# Patient Record
Sex: Male | Born: 1935 | Race: White | Hispanic: No | Marital: Married | Smoking: Never smoker
Health system: Southern US, Community
[De-identification: ages and names within clinical notes are randomized; demographics above are authoritative.]

## PROBLEM LIST (undated history)

## (undated) DIAGNOSIS — N183 Chronic kidney disease, stage 3 unspecified: Secondary | ICD-10-CM

## (undated) DIAGNOSIS — I509 Heart failure, unspecified: Secondary | ICD-10-CM

## (undated) DIAGNOSIS — Z955 Presence of coronary angioplasty implant and graft: Secondary | ICD-10-CM

## (undated) DIAGNOSIS — F0390 Unspecified dementia without behavioral disturbance: Secondary | ICD-10-CM

## (undated) DIAGNOSIS — I10 Essential (primary) hypertension: Secondary | ICD-10-CM

## (undated) DIAGNOSIS — I2510 Atherosclerotic heart disease of native coronary artery without angina pectoris: Secondary | ICD-10-CM

## (undated) DIAGNOSIS — K219 Gastro-esophageal reflux disease without esophagitis: Secondary | ICD-10-CM

## (undated) DIAGNOSIS — E785 Hyperlipidemia, unspecified: Secondary | ICD-10-CM

## (undated) HISTORY — DX: Hyperlipidemia, unspecified: E78.5

## (undated) HISTORY — DX: Essential (primary) hypertension: I10

## (undated) HISTORY — DX: Presence of coronary angioplasty implant and graft: Z95.5

## (undated) HISTORY — DX: Gastro-esophageal reflux disease without esophagitis: K21.9

## (undated) HISTORY — PX: CORONARY ARTERY BYPASS GRAFT: SHX141

## (undated) HISTORY — DX: Heart failure, unspecified: I50.9

## (undated) HISTORY — DX: Atherosclerotic heart disease of native coronary artery without angina pectoris: I25.10

---

## 1998-01-17 ENCOUNTER — Inpatient Hospital Stay (HOSPITAL_COMMUNITY): Admission: RE | Admit: 1998-01-17 | Discharge: 1998-01-26 | Payer: Self-pay | Admitting: Cardiology

## 1998-01-30 ENCOUNTER — Other Ambulatory Visit: Admission: RE | Admit: 1998-01-30 | Discharge: 1998-01-30 | Payer: Self-pay | Admitting: Surgery

## 1998-01-30 ENCOUNTER — Inpatient Hospital Stay (HOSPITAL_COMMUNITY): Admission: EM | Admit: 1998-01-30 | Discharge: 1998-02-09 | Payer: Self-pay | Admitting: Surgery

## 2005-06-25 ENCOUNTER — Emergency Department (HOSPITAL_COMMUNITY): Admission: EM | Admit: 2005-06-25 | Discharge: 2005-06-25 | Payer: Self-pay | Admitting: Family Medicine

## 2008-10-05 DIAGNOSIS — Z955 Presence of coronary angioplasty implant and graft: Secondary | ICD-10-CM

## 2008-10-05 HISTORY — DX: Presence of coronary angioplasty implant and graft: Z95.5

## 2008-10-10 ENCOUNTER — Inpatient Hospital Stay (HOSPITAL_COMMUNITY): Admission: EM | Admit: 2008-10-10 | Discharge: 2008-10-12 | Payer: Self-pay | Admitting: Cardiology

## 2008-10-11 HISTORY — PX: CARDIAC CATHETERIZATION: SHX172

## 2010-03-05 ENCOUNTER — Ambulatory Visit: Payer: Self-pay | Admitting: Cardiology

## 2010-07-04 ENCOUNTER — Ambulatory Visit: Payer: Self-pay | Admitting: Cardiology

## 2010-10-15 LAB — COMPREHENSIVE METABOLIC PANEL
ALT: 12 U/L (ref 0–53)
AST: 22 U/L (ref 0–37)
Albumin: 3.8 g/dL (ref 3.5–5.2)
CO2: 26 mEq/L (ref 19–32)
Calcium: 8.7 mg/dL (ref 8.4–10.5)
GFR calc Af Amer: >60 mL/min (ref >60)
Sodium: 139 mEq/L (ref 135–145)
Total Protein: 6.5 g/dL (ref 6.0–8.3)

## 2010-10-15 LAB — POCT CARDIAC MARKERS
CKMB, poc: 2.1 ng/mL (ref 1.0–8.0)
CKMB, poc: 2.6 ng/mL (ref 1.0–8.0)
Myoglobin, poc: 130 ng/mL (ref 12–200)
Troponin i, poc: <0.05 ng/mL (ref 0.00–0.09)

## 2010-10-15 LAB — CARDIAC PANEL(CRET KIN+CKTOT+MB+TROPI)
CK, MB: 5.8 ng/mL — ABNORMAL HIGH (ref 0.3–4.0)
Relative Index: INVALID (ref 0.0–2.5)
Troponin I: 0.17 ng/mL — ABNORMAL HIGH (ref 0.00–0.06)

## 2010-10-15 LAB — BASIC METABOLIC PANEL
Calcium: 8.8 mg/dL (ref 8.4–10.5)
Creatinine, Ser: 1.09 mg/dL (ref 0.4–1.5)
GFR calc Af Amer: >60 mL/min (ref >60)
GFR calc non Af Amer: >60 mL/min (ref >60)
Glucose, Bld: 195 mg/dL — ABNORMAL HIGH (ref 70–99)
Sodium: 136 mEq/L (ref 135–145)

## 2010-10-15 LAB — DIFFERENTIAL
Basophils Absolute: 0 10*3/uL (ref 0.0–0.1)
Basophils Relative: 0 % (ref 0–1)
Monocytes Absolute: 0.3 10*3/uL (ref 0.1–1.0)
Neutro Abs: 4.3 10*3/uL (ref 1.7–7.7)
Neutrophils Relative %: 73 % (ref 43–77)

## 2010-10-15 LAB — CBC
Hemoglobin: 10.9 g/dL — ABNORMAL LOW (ref 13.0–17.0)
MCHC: 34.2 g/dL (ref 30.0–36.0)
Platelets: 130 10*3/uL — ABNORMAL LOW (ref 150–400)
RBC: 3.51 MIL/uL — ABNORMAL LOW (ref 4.22–5.81)
RDW: 12.7 % (ref 11.5–15.5)
RDW: 13.1 % (ref 11.5–15.5)

## 2010-10-15 LAB — GLUCOSE, CAPILLARY
Glucose-Capillary: 105 mg/dL — ABNORMAL HIGH (ref 70–99)
Glucose-Capillary: 109 mg/dL — ABNORMAL HIGH (ref 70–99)
Glucose-Capillary: 127 mg/dL — ABNORMAL HIGH (ref 70–99)
Glucose-Capillary: 135 mg/dL — ABNORMAL HIGH (ref 70–99)
Glucose-Capillary: 161 mg/dL — ABNORMAL HIGH (ref 70–99)

## 2010-10-15 LAB — POCT I-STAT, CHEM 8
Calcium, Ion: 1.14 mmol/L (ref 1.12–1.32)
Chloride: 104 mEq/L (ref 96–112)
HCT: 36 % — ABNORMAL LOW (ref 39.0–52.0)
Potassium: 4.5 mEq/L (ref 3.5–5.1)

## 2010-10-15 LAB — APTT: aPTT: 30 seconds (ref 24–37)

## 2010-10-15 LAB — CK TOTAL AND CKMB (NOT AT ARMC): Relative Index: INVALID (ref 0.0–2.5)

## 2010-10-16 ENCOUNTER — Encounter: Payer: Self-pay | Admitting: *Deleted

## 2010-10-17 ENCOUNTER — Ambulatory Visit (INDEPENDENT_AMBULATORY_CARE_PROVIDER_SITE_OTHER): Payer: Medicare Other | Admitting: Cardiology

## 2010-10-17 ENCOUNTER — Encounter: Payer: Self-pay | Admitting: Cardiology

## 2010-10-17 DIAGNOSIS — Z9889 Other specified postprocedural states: Secondary | ICD-10-CM

## 2010-10-17 DIAGNOSIS — K3189 Other diseases of stomach and duodenum: Secondary | ICD-10-CM

## 2010-10-17 DIAGNOSIS — R1013 Epigastric pain: Secondary | ICD-10-CM

## 2010-10-17 DIAGNOSIS — E1159 Type 2 diabetes mellitus with other circulatory complications: Secondary | ICD-10-CM | POA: Insufficient documentation

## 2010-10-17 DIAGNOSIS — E7800 Pure hypercholesterolemia, unspecified: Secondary | ICD-10-CM

## 2010-10-17 DIAGNOSIS — Z951 Presence of aortocoronary bypass graft: Secondary | ICD-10-CM | POA: Insufficient documentation

## 2010-10-17 DIAGNOSIS — I119 Hypertensive heart disease without heart failure: Secondary | ICD-10-CM | POA: Insufficient documentation

## 2010-10-17 DIAGNOSIS — E119 Type 2 diabetes mellitus without complications: Secondary | ICD-10-CM

## 2010-10-17 LAB — COMPREHENSIVE METABOLIC PANEL
Albumin: 4.5 g/dL (ref 3.5–5.2)
BUN: 20 mg/dL (ref 6–23)
Calcium: 9.9 mg/dL (ref 8.4–10.5)
Chloride: 103 mEq/L (ref 96–112)
Glucose, Bld: 90 mg/dL (ref 70–99)
Potassium: 5 mEq/L (ref 3.5–5.1)

## 2010-10-17 LAB — HEMOGLOBIN A1C: Hgb A1c MFr Bld: 7.4 % — ABNORMAL HIGH (ref 4.6–6.5)

## 2010-10-17 LAB — LIPID PANEL
Cholesterol: 144 mg/dL (ref 0–200)
Triglycerides: 127 mg/dL (ref 0.0–149.0)

## 2010-10-17 NOTE — Assessment & Plan Note (Signed)
The patient has a history of essential hypertension.  He's not having any headaches or dizziness.  He's not having any symptoms of congestive heart failure.

## 2010-10-17 NOTE — Assessment & Plan Note (Signed)
The patient has not been expressing any recent chest pain or recurrent angina pectoris.  He has been walking regularly for exercise.  His walking is limited somewhat because of back pain Which is a chronic problem.

## 2010-10-17 NOTE — Progress Notes (Signed)
Jerry Benson Date of Birth:  02-Jul-1929 [facility] / [facility] [address]   [address] [place], [place]  [address] [phone]           Fax   [phone] Jerry Benson Date of Birth:  1928/07/23 [facility] / [facility] [address]   [address] [place], [place]  [address] [phone]           Fax   [phone]  History of Present Illness: This pleasant 77 year old gentleman has known ischemic heart disease.  He has had coronary artery bypass grafting twice.  His most recent heart surgery was in 2009.  In April of 2010 he was admitted with unstable angina and had cardiac catheterization on 10/11/08 and underwent percutaneous intervention on the saphenous vein graft to the diagonal and the obtuse marginal sequentially.  Since that time the patient has not been expressing any problems with angina pectoris.  The patient is not having any symptoms of congestive heart failure.  He is a diabetic but is not having any hypoglycemic episodes.  He tries to walk on a regular basis for exercise.  Current Outpatient Prescriptions  Medication Sig Dispense Refill  . ALPRAZolam (XANAX) 0.25 MG tablet Take 0.25 mg by mouth as needed.        Marland Kitchen aspirin 81 MG tablet Take 81 mg by mouth daily.        . clopidogrel (PLAVIX) 75 MG tablet Take 75 mg by mouth daily.        . famotidine (PEPCID) 40 MG tablet Take 40 mg by mouth as needed.       Marland Kitchen glipiZIDE (GLUCOTROL) 10 MG 24 hr tablet Take 10 mg by mouth daily.        . isosorbide dinitrate (ISORDIL) 20 MG tablet Take 10 mg by mouth daily. ( 1/2 tablet )       . metFORMIN (GLUCOPHAGE) 500 MG tablet Take 500 mg by mouth 3 (three) times daily.        . metoprolol (LOPRESSOR) 50 MG tablet Take 25 mg by mouth 2 (two) times daily. ( 1/2 tablet )       . nitroGLYCERIN (NITROSTAT) 0.4 MG SL tablet Place 0.4 mg under the tongue every 5 (five) minutes as needed.        . rosuvastatin (CRESTOR) 10 MG tablet Take 5 mg by mouth daily. (  1/2 tablet )         Allergies  Allergen Reactions  . Lipitor (Atorvastatin Calcium)   . Lisinopril     Rash / cough    Patient Active Problem List  Diagnoses  . Diabetes mellitus  . Hypercholesterolemia  . Hx of CABG  . Benign hypertensive heart disease without heart failure  . Dyspepsia    History  Smoking status  . Never Smoker   Smokeless tobacco  . Never Used    History  Alcohol Use No    Family History  Problem Relation Age of Onset  . Stroke Daughter     09/2008  . Heart disease Father   . Heart attack Father   . Heart attack Brother     Review of Systems: Constitutional: no fever chills diaphoresis or fatigue or change in weight.  Head and neck: no hearing loss, no epistaxis, no photophobia or visual disturbance. Respiratory: No cough, shortness of breath or wheezing. Cardiovascular: No chest pain peripheral edema, palpitations. Gastrointestinal: No abdominal distention, no abdominal pain, no change in bowel habits  hematochezia or melena. Genitourinary: No dysuria, no frequency, no urgency, no nocturia. Musculoskeletal:No arthralgias, , no gait disturbance or myalgias. Neurological: No dizziness, no headaches, no numbness, no seizures, no syncope, no weakness, no tremors. Hematologic: No lymphadenopathy, no easy bruising. Psychiatric: No confusion, no hallucinations, no sleep disturbance.    Physical Exam: Filed Vitals:   10/17/10 1058  BP: 132/80  Pulse: 80  The general appearance is an elderly gentleman in no acute distress.Pupils equal and reactive.   Extraocular Movements are full.  There is no scleral icterus.  The mouth and pharynx are normal.  The neck is supple.  The carotids reveal no bruits.  The jugular venous pressure is normal.  The thyroid is not enlarged.  There is no lymphadenopathy.The chest is clear to percussion and auscultation. There are no rales or rhonchi. Expansion of the chest is symmetrical.  The precordium is quiet.  The  first heart sound is normal.  The second heart sound is physiologically split.  There is no murmur gallop rub or click.  There is no abnormal lift or heave.The abdomen is soft and nontender. Bowel sounds are normal. The liver and spleen are not enlarged. There Are no abdominal masses. There are no bruits.The pedal pulses are good.  There is no phlebitis or edema.  There is no cyanosis or clubbing.Strength is normal and symmetrical in all extremities.  There is no lateralizing weakness.  There are no sensory deficits.   Assessment / Plan: Blood work is pending today.  Continue same medication.  Recheck in 4 months.

## 2010-10-17 NOTE — Assessment & Plan Note (Signed)
The patient is on low dose Crestor.  He is not having any myalgias from the Crestor.

## 2010-10-17 NOTE — Assessment & Plan Note (Signed)
The patient has not been having any hypoglycemic episodes.  He is trying to stay on a careful diabetic diet.

## 2010-10-23 ENCOUNTER — Telehealth: Payer: Self-pay | Admitting: *Deleted

## 2010-10-23 NOTE — Telephone Encounter (Signed)
Message copied by Regis Bill on Thu Oct 23, 2010 11:25 AM ------      Message from: Cassell Clement      Created: Sun Oct 19, 2010  4:40 PM       Blood tests are excellent.  The blood sugar is normal.  The cholesterol is normal.  The hemoglobin A1c is still slightly high at 7.4 so continue careful diabetic diet

## 2010-10-23 NOTE — Telephone Encounter (Signed)
Advised patient of labs 

## 2010-11-07 ENCOUNTER — Other Ambulatory Visit: Payer: Self-pay | Admitting: Cardiology

## 2010-11-07 NOTE — Telephone Encounter (Signed)
Fax received from pharmacy. Refill completed. Jodette Tifanny Dollens RN  

## 2010-11-13 ENCOUNTER — Other Ambulatory Visit: Payer: Self-pay | Admitting: Cardiology

## 2010-11-13 NOTE — Telephone Encounter (Signed)
Faxed refill for metformin to [facility] #[id number]

## 2010-11-18 NOTE — Discharge Summary (Signed)
Jerry Benson, Jerry Benson NO.:  [id number]   MEDICAL RECORD NO.:  [id number]          PATIENT TYPE:  INP   LOCATION:  2502                         FACILITY:  [facility]   PHYSICIAN:  Colleen Can. Deborah Chalk, M.D.DATE OF BIRTH:  12-Apr-1929   DATE OF ADMISSION:  10/10/2008  DATE OF DISCHARGE:  10/12/2008                               DISCHARGE SUMMARY   DISCHARGE DIAGNOSES:  1. Chest pain with slight elevation in CK and troponin levels with      subsequent elective cardiac catheterization followed by      percutaneous coronary intervention and stenting of the saphenous      vein graft to the diagonal/obtuse marginal.  There is a 3.5 x 15 mm      Xience (drug-eluting) stent placed.  2. Known ischemic heart disease with history of coronary artery bypass      grafting x2 per Dr. Evelene Croon in July 1999.  His original bypass      grafting was x5 in 1981 by Dr. Edilia Bo.  3. Non-insulin-dependent diabetes.  4. Hypertension  5. Hyperlipidemia.  6. Indigestion.   HISTORY OF PRESENT ILLNESS:  The patient is a very pleasant 77 year old  white male who presented with chest pain.  He has been followed by Dr.  Patty Sermons on a regular basis.  He had had chest pain for the past week  or so prior to admission.  They occurred with any sort of exertion.  It  was described as a heaviness and located in the mid of the chest.  It  radiated into his throat out to his shoulders and down his arms.  It was  relieved with rest and/or nitroglycerin.  He had called the office on  Monday night but refused to come to the hospital.  He was called in  additional nitroglycerin which seemed to help.  He subsequently was  convinced to come into the emergency room and subsequently was admitted  on October 10, 2008.   Please see the dictated history and physical for further patient  presentation and profile.   LABORATORY DATA:  On admission CBC showed a hemoglobin 12.5, hematocrit  36.5, white count 5.9,  platelets 130.  Chemistries showed sodium 139,  potassium 3.9, chloride 105, CO2 26, BUN 12, creatinine 0.9, and glucose  of 116.  PT and PTT were unremarkable.  His troponins peaked at 0.23.  His CK MBs peaked at 5.8.  TSH was normal at 2.6.  BNP was 275.   Chest x-ray on admission showed no acute findings.   His EKG showed sinus rhythm with left ventricular hypertrophy associated  with ST and T-wave changes.  There were no acute changes.   HOSPITAL COURSE:  The patient was admitted electively.  He was placed on  IV nitroglycerin and IV heparin.  We proceeded on with cardiac  catheterization the following morning.  That procedure was tolerated  well without any known complications.  There was a severe stenosis in  the saphenous vein graft to the diagonal and obtuse marginal with  successful stent placement proximally.  He had a  3.5 x 15 mm Xience drug-  eluting stent placed.  The vein graft to the right coronary was patent.  The left internal mammary to the diagonal and LAD was patent.  The  native circulation was totally occluded and there was mild reduction of  LV ejection fraction with lateral hypokinesia.  The ejection fraction  was estimated to be 45%.   Postprocedure, he was transferred to 2500.  He has done well throughout  the remainder of his hospitalization.  He will need to be on Plavix for  1 year.  Today on October 12, 2008 he is doing well without complaints and  is felt to be a satisfactory candidate for discharge. Blood pressure was  elevated at time of discharge and the patient's ace inhibitor was  increased.   DISCHARGE LABORATORY DATA:  His hemoglobin is 10.9, hematocrit is 31.3,  platelets are 112.  Chemistries are all normal except for glucose of  195.   DISCHARGE DIAGNOSIS:  Stable.   DISCHARGE DIET:  Diabetic, low-salt heart-healthy.   INSTRUCTIONS:  He is to increase his activity slowly.  He is not to  drive or engage in sexual activity until seen back in  followup.   He is to use an ice pack if needed to the groin.   We will have him see Dr. Patty Sermons in approximately 1 week.  We will  also do repeat lab work on Monday to follow up his renal function as  well as his CBC on Monday, October 15, 2008.   DISCHARGE MEDICINES:  1. Plavix 75 mg a day every day for 1 year.  2. Pepcid 40 mg at night.  3. He will stay off metformin until his blood work has been done on      Monday.  4. Lotensin is increased to 20 mg a day.  5. Glipizide 10 mg a day.  6. Lopressor 25 b.i.d.  7. Isosorbide 20 mg half a tablet three times a day. He reportedly      took this only prn, but will now take as scheduled.  8. Crestor 10 mg half a tablet daily.  9. Xanax p.r.n.  10.Nitroglycerin p.r.n.  11.Enteric-coated aspirin 325 mg a day.   He is to call the office if any problems would arise in the interim.   Greater than 30 minutes for discharge.      Sharlee Blew, N.P.      Colleen Can. Deborah Chalk, M.D.  Electronically Signed    LC/MEDQ  D:  10/12/2008  T:  10/12/2008  Job:  [id number]

## 2010-11-18 NOTE — Cardiovascular Report (Signed)
NAMEBRANTLEE, PENN NO.:  [id number]   MEDICAL RECORD NO.:  [id number]          PATIENT TYPE:  INP   LOCATION:  2036                         FACILITY:  [facility]   PHYSICIAN:  Colleen Can. Deborah Chalk, M.D.DATE OF BIRTH:  Apr 02, 1929   DATE OF PROCEDURE:  10/11/2008  DATE OF DISCHARGE:                            CARDIAC CATHETERIZATION   PROCEDURES:  Left heart catheterization with selective coronary  angiography, left ventricular angiography, saphenous vein graft  angiography x2, and angiography of left internal mammary artery, with  percutaneous coronary intervention on the saphenous vein graft to the  diagonal and obtuse marginal sequentially.   TYPE AND SITE OF ENTRY:  Percutaneous right femoral artery with Angio-  Seal.   CATHETERS:  6-French 4 curved Judkins right and left coronary catheters,  6-French pigtail ventriculographic catheter, 5-French left coronary  artery bypass graft catheter, 6-French left coronary artery bypass graft  guide, Prowater guidewire, BMW guidewire, 2.5 x 15 mm apex, and  subsequently a 3.5 x 15 mm XIENCE (drug-eluting) stent.   MEDICATIONS GIVEN PRIOR TO PROCEDURE:  Valium 10 mg p.o.   MEDICATIONS GIVEN DURING THE PROCEDURE:  Plavix, Pepcid, and Angiomax.   COMMENTS:  The patient tolerated the procedure well.   HEMODYNAMIC DATA:  The aortic pressure was 149/73, LV was 125/4-10.  There is no aortic valve gradient noted on pullback.   ANGIOGRAPHIC DATA:  Left ventricular angiogram was performed in RAO  position.  Overall cardiac size and silhouette were normal.  There was  lateral hypokinesis.  The ejection fraction was 45%.   CORONARY ARTERIES:  1. Right coronary artery is totally occluded proximally.  There was a      conus branch, they gave slight flow to the RV wall.  2. Left main coronary artery is occluded with only scanty flow to      proximal septal perforating branch.  All other vessels were      occluded.   Saphenous  vein graft to the posterior descending artery is widely patent  with a nice insertion.  Good distal runoff.  There mild distal coronary  atherosclerosis, but no significant obstructive disease.   Saphenous vein graft sequentially to the diagonal and obtuse marginal  had a severe 90% stenosis proximally.  The remainder of the vein graft  was relatively smooth.  Insertion sites were satisfactory with only mild  narrowing in the distal insertion site to the obtuse marginal.  There  was somewhat mild-to-moderate diffuse disease in the diagonal and obtuse  marginal systems otherwise.   Left internal mammary artery to the diagonal and LAD sequentially was  patent with excellent flow and satisfactory distal runoff into both  branches.   ANGIOPLASTY PROCEDURE:  Angioplasty was performed using a 6-French left  coronary artery bypass graft catheter.  We initially passed Prowater  guidewire across the lesion without difficulty.  We were able to  initially pass the 2.5 x 15 mm apex balloon across the lesion and  dilated.  However, when we returned to pass the stent, we were unable to  pass the stent across the lesion.  We returned with the apex balloon and  tried to pass it across this to predilate second time, we had difficulty  getting it to pass.  We then passed a B and W (buddy and wire) across  and we were able to pass the balloon back across the lesion.  We dilated  it to 18 atmospheres.  We then returned with the 3.5 x 15 mm XIENCE  stent.  It was inflated to maximum of 18 atmospheres with full  expansion.  It was an excellent fit for the vein graft with no residual  stenosis.   OVERALL IMPRESSION:  1. Severe stenosis in the saphenous vein graft to the diagonal and      obtuse marginal with successful stent placement proximally.  2. Patent saphenous vein graft to the right coronary artery.  3. Patent sequential left internal mammary artery to the diagonal and      LAD.  4. Totally  occluded native circulation.  5. Mild reduction left ventricular ejection fraction with lateral      hypokinesis.   DISCUSSION:  Mr. Lamere will need to remain on Plavix for 1 year.  He is  felt to be completely revascularized at this point in time.      Colleen Can. Deborah Chalk, M.D.  Electronically Signed     SNT/MEDQ  D:  10/11/2008  T:  10/11/2008  Job:  [id number]

## 2010-11-18 NOTE — H&P (Signed)
NAMEJURGEN, GROENEVELD NO.:  [id number]   MEDICAL RECORD NO.:  [id number]          PATIENT TYPE:  INP   LOCATION:  2036                         FACILITY:  [facility]   PHYSICIAN:  Vesta Mixer, M.D. DATE OF BIRTH:  [birth year]   DATE OF ADMISSION:  10/10/2008  DATE OF DISCHARGE:                              HISTORY & PHYSICAL   Jerry Benson is a 77 year old gentleman with a history of coronary artery  disease.  He is status post coronary artery bypass grafting x2.  His  last bypass grafting was in 2009.  He has been seen by Dr. Patty Sermons on  a regular basis.  He has been having chest pain for the past week or so.  These episodes of chest pain occur with any sort of exertion.  They are  described as a heaviness.  It is in the middle of the chest.  It  radiates up to his throat out to his shoulders and down to his arms.  It  is relieved with rest and/or nitroglycerin.  Recently, for the past day  or so, he has had progressive shortness of breath and chest pain and has  had chest pain whenever he has not taken nitroglycerin.  He has taken  many nitroglycerins over the past couple of days.  His wife called me on  Monday night.  He refused to come to the hospital, but we did call an  additional nitroglycerin which seems to help.  She finally convinced him  to come in today.   He has had some indigestion-like chest pain.  There is some nausea.  There is some diaphoresis.  He denies any PND or orthopnea.  He denies  any syncope or presyncope.   CURRENT MEDICATIONS:  1. Lopressor 25 mg p.o. b.i.d.  2. Metformin 1000 mg p.o. b.i.d.  3. Isosorbide 20 mg - 1/2 tablet 3 times a day.  4. Crestor 5 mg a day.  5. Glipizide 10 mg a day.  6. Lotensin 10 mg a day.  7. Xanax as needed.   ALLERGIES:  None.   PAST MEDICAL HISTORY:  1. Coronary artery disease.  He is status post coronary artery bypass      grafting in 1999 by Dr. Rexanne Mano.  2. Hyperlipidemia.  3. History of  hypertension.  4. Diabetes mellitus.   SOCIAL HISTORY:  The patient is a nonsmoker and nondrinker.   FAMILY HISTORY:  Positive for coronary artery disease.   REVIEW OF SYSTEMS:  Reviewed in the HPI.  All other systems were  reviewed and are negative.   PHYSICAL EXAMINATION:  GENERAL:  He is an elderly gentleman in no acute  distress.  VITAL SIGNS:  His blood pressure is 168/107 with a heart rate of 78.  HEENT:  2+ carotids.  He has no bruits, no JVD, no thyromegaly.  Sclerae  are nonicteric.  His mucous membranes are moist.  NECK:  Supple.  LUNGS:  Clear.  HEART:  Regular rate, S1 and S2.  ABDOMEN:  Good bowel sounds and is nontender.  EXTREMITIES:  There is no  clubbing, cyanosis, or edema.  NEURO:  Nonfocal.  SKIN:  Warm and dry.  His gait was not able to be assessed.   His EKG reveals normal sinus rhythm.  He has left ventricular  hypertrophy with associated ST and T wave changes.  There are no acute  changes.   LABORATORY DATA:  White blood cell count of 5.9, hemoglobin is 12.5, and  hematocrit is 36.5.  Sodium is 140, potassium is 4.5, chloride is 104,  glucose is 145, and creatinine is 1.2.  His BNP is 275.  His CPK-MB is  2.1 with a troponin of 0.05.   IMPRESSION AND PLAN:  1. Chest pain.  The patient presents with fairly typical symptoms for      unstable angina.  His pain has been relieved with sublingual      nitroglycerin.  We will go ahead and start him on intravenous      nitroglycerin.  We will also put him on heparin.  We will      anticipate doing a heart catheterization on him tomorrow.  He has      had 2 heart catheterizations.  We have discussed the risks,      benefits, and options of heart catheterization.  He understands and      agrees to proceed.  2. Diabetes mellitus.  We will check Accu-Cheks and put him on sliding      scale insulin as needed.  3. All other medical problems are stable.      Vesta Mixer, M.D.  Electronically  Signed     PJN/MEDQ  D:  10/10/2008  T:  10/11/2008  Job:  [id number]   cc:   Cassell Clement, M.D.

## 2010-11-25 ENCOUNTER — Other Ambulatory Visit: Payer: Self-pay | Admitting: Cardiology

## 2010-11-25 DIAGNOSIS — E785 Hyperlipidemia, unspecified: Secondary | ICD-10-CM

## 2010-11-25 NOTE — Telephone Encounter (Signed)
escribe request  

## 2010-12-06 ENCOUNTER — Other Ambulatory Visit: Payer: Self-pay | Admitting: Cardiology

## 2010-12-06 DIAGNOSIS — I10 Essential (primary) hypertension: Secondary | ICD-10-CM

## 2010-12-10 NOTE — Telephone Encounter (Signed)
escribe request  

## 2011-01-05 ENCOUNTER — Other Ambulatory Visit: Payer: Self-pay | Admitting: *Deleted

## 2011-01-05 DIAGNOSIS — K30 Functional dyspepsia: Secondary | ICD-10-CM

## 2011-01-05 MED ORDER — FAMOTIDINE 40 MG PO TABS: 40.0000 mg | ORAL_TABLET | ORAL | Status: DC | PRN

## 2011-01-05 NOTE — Telephone Encounter (Signed)
Refilled famotidine 

## 2011-01-20 ENCOUNTER — Other Ambulatory Visit: Payer: Self-pay | Admitting: Cardiology

## 2011-01-21 ENCOUNTER — Other Ambulatory Visit (INDEPENDENT_AMBULATORY_CARE_PROVIDER_SITE_OTHER): Payer: Medicare Other | Admitting: *Deleted

## 2011-01-21 ENCOUNTER — Encounter: Payer: Self-pay | Admitting: Nurse Practitioner

## 2011-01-21 ENCOUNTER — Ambulatory Visit (INDEPENDENT_AMBULATORY_CARE_PROVIDER_SITE_OTHER): Payer: Medicare Other | Admitting: Nurse Practitioner

## 2011-01-21 DIAGNOSIS — R0989 Other specified symptoms and signs involving the circulatory and respiratory systems: Secondary | ICD-10-CM

## 2011-01-21 DIAGNOSIS — R079 Chest pain, unspecified: Secondary | ICD-10-CM

## 2011-01-21 DIAGNOSIS — E7800 Pure hypercholesterolemia, unspecified: Secondary | ICD-10-CM

## 2011-01-21 DIAGNOSIS — I119 Hypertensive heart disease without heart failure: Secondary | ICD-10-CM

## 2011-01-21 DIAGNOSIS — I2510 Atherosclerotic heart disease of native coronary artery without angina pectoris: Secondary | ICD-10-CM

## 2011-01-21 LAB — CBC WITH DIFFERENTIAL/PLATELET
Basophils Absolute: 0 10*3/uL (ref 0.0–0.1)
Basophils Relative: 0.2 % (ref 0.0–3.0)
Eosinophils Absolute: 0.2 10*3/uL (ref 0.0–0.7)
Eosinophils Relative: 3.5 % (ref 0.0–5.0)
HCT: 38.8 % — ABNORMAL LOW (ref 39.0–52.0)
Hemoglobin: 13 g/dL (ref 13.0–17.0)
Lymphocytes Relative: 16.6 % (ref 12.0–46.0)
Lymphs Abs: 1 10*3/uL (ref 0.7–4.0)
MCHC: 33.5 g/dL (ref 30.0–36.0)
MCV: 92.7 fl (ref 78.0–100.0)
Monocytes Absolute: 0.4 10*3/uL (ref 0.1–1.0)
Monocytes Relative: 5.9 % (ref 3.0–12.0)
Neutro Abs: 4.5 10*3/uL (ref 1.4–7.7)
Neutrophils Relative %: 73.8 % (ref 43.0–77.0)
Platelets: 130 10*3/uL — ABNORMAL LOW (ref 150.0–400.0)
RBC: 4.18 Mil/uL — ABNORMAL LOW (ref 4.22–5.81)
RDW: 13.4 % (ref 11.5–14.6)
WBC: 6 10*3/uL (ref 4.5–10.5)

## 2011-01-21 LAB — LIPID PANEL
Total CHOL/HDL Ratio: 3
Triglycerides: 165 mg/dL — ABNORMAL HIGH (ref 0.0–149.0)

## 2011-01-21 LAB — HEPATIC FUNCTION PANEL
AST: 23 U/L (ref 0–37)
Albumin: 4.6 g/dL (ref 3.5–5.2)
Alkaline Phosphatase: 58 U/L (ref 39–117)
Bilirubin, Direct: 0.1 mg/dL (ref 0.0–0.3)
Total Bilirubin: 0.9 mg/dL (ref 0.3–1.2)

## 2011-01-21 LAB — BASIC METABOLIC PANEL
CO2: 28 mEq/L (ref 19–32)
Calcium: 9.6 mg/dL (ref 8.4–10.5)
Creatinine, Ser: 1.1 mg/dL (ref 0.4–1.5)

## 2011-01-21 MED ORDER — ISOSORBIDE DINITRATE 20 MG PO TABS: 10.0000 mg | ORAL_TABLET | Freq: Every day | ORAL | Status: DC

## 2011-01-21 MED ORDER — AMLODIPINE BESYLATE 5 MG PO TABS: 5.0000 mg | ORAL_TABLET | Freq: Every day | ORAL | Status: DC

## 2011-01-21 MED ORDER — NITROGLYCERIN 0.4 MG SL SUBL: 0.4000 mg | SUBLINGUAL_TABLET | SUBLINGUAL | Status: DC | PRN

## 2011-01-21 NOTE — Patient Instructions (Signed)
We are going to add Norvasc (amlodipine) 5 mg to your medicines. Take each day. Take your Isordil every day I have refilled your NTG. Use your NTG under your tongue for recurrent chest pain. May take one tablet every 5 minutes. If you are still having discomfort after 3 tablets in 15 minutes, call 911. We are going to check some labs today. I will see you back early next week. Go to the ER for any problems in the interim.Marland Kitchen

## 2011-01-21 NOTE — Assessment & Plan Note (Signed)
Labs are checked today in follow up.

## 2011-01-21 NOTE — Progress Notes (Signed)
Jerry Benson Date of Birth: 01/13/1929   History of Present Illness: Jerry Benson is seen today for a work in visit. He is seen for Dr. Patty Sermons. He was last here in April and due back in August. He is here with his daughter. She notes that he seems to be complaining of more chest pain. He describes a sharp shooting pain across his left chest. His NTG is old. He only takes his Isordil prn. Blood pressure is grossly elevated. No shortness of breath. No real exertional component. He says it feels like his heart pain, but review of his last admission reported that he usually has more of a pressure sensation with radiation to neck and arms. He is not having any of these current symptoms. He does have GERD and is on chronic Pepcid.   Current Outpatient Prescriptions on File Prior to Visit  Medication Sig Dispense Refill  . ALPRAZolam (XANAX) 0.25 MG tablet Take 0.25 mg by mouth as needed.        Marland Kitchen aspirin 81 MG tablet Take 81 mg by mouth daily.        . benazepril (LOTENSIN) 10 MG tablet TAKE 1 TABLET BY MOUTH EVERY DAY  90 tablet  3  . CRESTOR 10 MG tablet TAKE  1/2 TABLET BY MOUTH DAILY  45 tablet  0  . famotidine (PEPCID) 40 MG tablet Take 1 tablet (40 mg total) by mouth as needed.  90 tablet  3  . glipiZIDE (GLUCOTROL) 10 MG 24 hr tablet Take 10 mg by mouth daily.        . metFORMIN (GLUCOPHAGE) 500 MG tablet TAKE 1 TABLET BY MOUTH THREE TIMES DAILY  270 tablet  3  . metoprolol (LOPRESSOR) 50 MG tablet TAKE 1/2 TABLET BY MOUTH TWICE DAILY  30 tablet  6  . PLAVIX 75 MG tablet TAKE 1 TABLET BY MOUTH DAILY  90 tablet  0  . DISCONTD: isosorbide dinitrate (ISORDIL) 20 MG tablet Take 10 mg by mouth daily. ( 1/2 tablet )       . DISCONTD: nitroGLYCERIN (NITROSTAT) 0.4 MG SL tablet Place 0.4 mg under the tongue every 5 (five) minutes as needed.          Allergies  Allergen Reactions  . Lipitor (Atorvastatin Calcium)   . Lisinopril     Rash / cough    Past Medical History  Diagnosis Date  . Coronary  artery disease   . Diabetes mellitus   . Hypertension   . Hyperlipidemia   . History of ischemic heart disease   . Chest pain   . GERD (gastroesophageal reflux disease)   . Status post insertion of non-drug eluting coronary artery stent 10/05/2008  . CHF (congestive heart failure)   . Heart attack 08/1979    Past Surgical History  Procedure Date  . Coronary artery bypass graft 1999  . Cardiac catheterization 10/11/2008    History  Smoking status  . Never Smoker   Smokeless tobacco  . Never Used    History  Alcohol Use No    Family History  Problem Relation Age of Onset  . Stroke Daughter     09/2008  . Heart disease Father   . Heart attack Father   . Heart attack Brother   . Heart disease Mother     Review of Systems: The review of systems is positive for GERD. No dizziness. He does not check his blood pressure at home. He is not exercising. He has been  out in the extreme heat trying to work on [employer]  All other systems were reviewed and are negative.  Physical Exam: BP 170/74  Pulse 66  Ht 5\' 3"  (1.6 m)  Wt 146 lb (66.225 kg)  BMI 25.86 kg/m2 Patient is very pleasant and in no acute distress. Skin is warm and dry. Color is very pale.   HEENT is unremarkable. Normocephalic/atraumatic. PERRL. Sclera are nonicteric. Neck is supple. No masses. No JVD. Lungs are clear. Cardiac exam shows a regular rate and rhythm. Abdomen is soft. Extremities are without edema. Gait and ROM are intact. No gross neurologic deficits noted.  LABORATORY DATA: EKG shows sinus with old anterolateral MI. He has diffuse ST and T wave changes. No real change from prior tracings.  Assessment / Plan:

## 2011-01-21 NOTE — Assessment & Plan Note (Signed)
Has history of CABG as well as redo CABG in 2009. He had PCI to a SVG to the diagonal and OM in 2010. No recent stress test. He wishes for medical management and does not want cardiac cath at this time. His chest pain seems different from his prior chest pain syndrome to me. Regardless, we will add Norvasc 5 mg daily and have him take the Isordil every day as well. NTG is refilled. He remains on Plavix.  I will see him back early next week. He is to go to the ER for any worsening of symptoms and he is agreeable to this plan.

## 2011-01-21 NOTE — Assessment & Plan Note (Signed)
Blood pressure is up. Norvasc is added.

## 2011-01-22 ENCOUNTER — Telehealth: Payer: Self-pay | Admitting: *Deleted

## 2011-01-22 NOTE — Telephone Encounter (Signed)
Advised patient of lab results  

## 2011-01-27 ENCOUNTER — Ambulatory Visit (INDEPENDENT_AMBULATORY_CARE_PROVIDER_SITE_OTHER): Payer: Medicare Other | Admitting: Nurse Practitioner

## 2011-01-27 ENCOUNTER — Encounter: Payer: Self-pay | Admitting: Nurse Practitioner

## 2011-01-27 VITALS — BP 120/70 | HR 72 | Ht 65.0 in | Wt 147.0 lb

## 2011-01-27 DIAGNOSIS — I2510 Atherosclerotic heart disease of native coronary artery without angina pectoris: Secondary | ICD-10-CM

## 2011-01-27 DIAGNOSIS — I119 Hypertensive heart disease without heart failure: Secondary | ICD-10-CM

## 2011-01-27 NOTE — Progress Notes (Signed)
Jerry Benson Date of Birth: [birth year]   History of Present Illness: Jerry Benson is seen back today for a 5 day check. He is seen for Dr. Patty Sermons. He was here last week and was complaining of chest pain. Blood pressure was up. We restarted his Isordil and put him on Norvasc. He is doing much better and says he has improved nicely. No more chest pain. He is tolerating his medicines.   Current Outpatient Prescriptions on File Prior to Visit  Medication Sig Dispense Refill  . ALPRAZolam (XANAX) 0.25 MG tablet Take 0.25 mg by mouth as needed.        Marland Kitchen amLODipine (NORVASC) 5 MG tablet Take 1 tablet (5 mg total) by mouth daily.  30 tablet  11  . aspirin 81 MG tablet Take 81 mg by mouth daily.        . benazepril (LOTENSIN) 10 MG tablet TAKE 1 TABLET BY MOUTH EVERY DAY  90 tablet  3  . CRESTOR 10 MG tablet TAKE  1/2 TABLET BY MOUTH DAILY  45 tablet  0  . famotidine (PEPCID) 40 MG tablet Take 1 tablet (40 mg total) by mouth as needed.  90 tablet  3  . glipiZIDE (GLUCOTROL) 10 MG 24 hr tablet Take 10 mg by mouth daily.        . isosorbide dinitrate (ISORDIL) 20 MG tablet Take 0.5 tablets (10 mg total) by mouth daily. ( 1/2 tablet )  30 tablet  3  . metFORMIN (GLUCOPHAGE) 500 MG tablet TAKE 1 TABLET BY MOUTH THREE TIMES DAILY  270 tablet  3  . metoprolol (LOPRESSOR) 50 MG tablet TAKE 1/2 TABLET BY MOUTH TWICE DAILY  30 tablet  6  . nitroGLYCERIN (NITROSTAT) 0.4 MG SL tablet Place 1 tablet (0.4 mg total) under the tongue every 5 (five) minutes as needed.  25 tablet  11  . PLAVIX 75 MG tablet TAKE 1 TABLET BY MOUTH DAILY  90 tablet  0    Allergies  Allergen Reactions  . Lipitor (Atorvastatin Calcium)   . Lisinopril     Rash / cough. However patient is currently taking Lotensin with no problems    Past Medical History  Diagnosis Date  . Coronary artery disease   . Diabetes mellitus   . Hypertension   . Hyperlipidemia   . History of ischemic heart disease   . Chest pain   . GERD  (gastroesophageal reflux disease)   . Status post insertion of non-drug eluting coronary artery stent 10/05/2008  . CHF (congestive heart failure)   . Heart attack 08/1979    Past Surgical History  Procedure Date  . Coronary artery bypass graft 1999  . Cardiac catheterization 10/11/2008    History  Smoking status  . Never Smoker   Smokeless tobacco  . Never Used    History  Alcohol Use No    Family History  Problem Relation Age of Onset  . Stroke Daughter     09/2008  . Heart disease Father   . Heart attack Father   . Heart attack Brother   . Heart disease Mother     Review of Systems: The review of systems is as above.  All other systems were reviewed and are negative.  Physical Exam: BP 120/70  Pulse 72  Ht 5\' 5"  (1.651 m)  Wt 147 lb (66.679 kg)  BMI 24.46 kg/m2 Patient is very pleasant and in no acute distress. Skin is warm and dry. Color is normal.  HEENT is unremarkable. Normocephalic/atraumatic. PERRL. Sclera are nonicteric. Neck is supple. No masses. No JVD. Lungs are clear. Cardiac exam shows a regular rate and rhythm. Abdomen is soft. Extremities are without edema. Gait and ROM are intact. No gross neurologic deficits noted.  LABORATORY DATA:   Assessment / Plan:

## 2011-01-27 NOTE — Patient Instructions (Addendum)
Stay on all your medicines We will see you back in 2 months Call for any problems.

## 2011-01-27 NOTE — Assessment & Plan Note (Signed)
Prior history of CABG as well as redo CABG in 2009. He had PCI to a SVG to the DX and OM in 2010. He has been placed back on his Isordil and we have added Norvasc to help with his blood pressure. He is improved and currently without chest pain. We will continue with medical management. I will see him in 2 months. Patient is agreeable to this plan and will call if any problems develop in the interim.

## 2011-01-27 NOTE — Assessment & Plan Note (Signed)
Blood pressure is much better with the addition of Norvasc.

## 2011-02-04 ENCOUNTER — Other Ambulatory Visit: Payer: Self-pay | Admitting: Cardiology

## 2011-02-20 ENCOUNTER — Other Ambulatory Visit: Payer: Self-pay | Admitting: Cardiology

## 2011-02-20 DIAGNOSIS — E7800 Pure hypercholesterolemia, unspecified: Secondary | ICD-10-CM

## 2011-02-20 NOTE — Telephone Encounter (Signed)
escribe request  

## 2011-03-06 ENCOUNTER — Other Ambulatory Visit: Payer: Self-pay | Admitting: Cardiology

## 2011-03-06 DIAGNOSIS — I2510 Atherosclerotic heart disease of native coronary artery without angina pectoris: Secondary | ICD-10-CM

## 2011-03-06 NOTE — Telephone Encounter (Signed)
escribe request  

## 2011-03-27 ENCOUNTER — Ambulatory Visit (INDEPENDENT_AMBULATORY_CARE_PROVIDER_SITE_OTHER): Payer: Medicare Other | Admitting: Nurse Practitioner

## 2011-03-27 ENCOUNTER — Ambulatory Visit: Payer: Medicare Other | Admitting: Nurse Practitioner

## 2011-03-27 ENCOUNTER — Encounter: Payer: Self-pay | Admitting: Nurse Practitioner

## 2011-03-27 VITALS — BP 172/88 | HR 60 | Ht 65.0 in | Wt 145.4 lb

## 2011-03-27 DIAGNOSIS — I10 Essential (primary) hypertension: Secondary | ICD-10-CM

## 2011-03-27 DIAGNOSIS — I119 Hypertensive heart disease without heart failure: Secondary | ICD-10-CM

## 2011-03-27 DIAGNOSIS — I2510 Atherosclerotic heart disease of native coronary artery without angina pectoris: Secondary | ICD-10-CM

## 2011-03-27 MED ORDER — BENAZEPRIL HCL 20 MG PO TABS: 20.0000 mg | ORAL_TABLET | Freq: Every day | ORAL | Status: DC

## 2011-03-27 NOTE — Assessment & Plan Note (Signed)
Blood pressure is up. He has not tolerated Norvasc. I have increased his Lotensin to 20 mg. I will see him back in a month and check BMET on return. I have asked him to check some blood pressures at home. Patient is agreeable to this plan and will call if any problems develop in the interim.

## 2011-03-27 NOTE — Patient Instructions (Signed)
Do not take anymore amlodipine We are going to increase your benazepril to 20 mg daily. Take 2 of your tablets and use them up and I sent the prescription for the 20 mg to the drug store. I will see you in about 1 month Check a few blood pressures for me and write them down. Bring to your next visit Call for any problems.

## 2011-03-27 NOTE — Assessment & Plan Note (Signed)
No chest pain reported 

## 2011-03-27 NOTE — Progress Notes (Signed)
Jerry Benson Date of Birth: Apr 21, 1929   History of Present Illness: Jerry Benson is seen back today for a 2 month check. He is seen for Dr. Patty Sermons. He is doing well. No angina. I had started him on Norvasc a couple of months ago for his blood pressure that was associated with chest pain. He was not able to tolerate this. He says it made his head hurt and made him dizzy. He stopped it. Blood pressure is back up. He is able to tolerate his Isordil and Lotensin. No cardiac complaints.  Current Outpatient Prescriptions on File Prior to Visit  Medication Sig Dispense Refill  . ALPRAZolam (XANAX) 0.25 MG tablet Take 0.25 mg by mouth as needed.        Marland Kitchen aspirin 81 MG tablet Take 81 mg by mouth daily.        . benazepril (LOTENSIN) 10 MG tablet TAKE 1 TABLET BY MOUTH EVERY DAY  90 tablet  3  . CRESTOR 10 MG tablet TAKE  1/2 TABLET BY MOUTH DAILY  45 tablet  0  . famotidine (PEPCID) 40 MG tablet Take 1 tablet (40 mg total) by mouth as needed.  90 tablet  3  . glipiZIDE (GLUCOTROL) 10 MG 24 hr tablet Take 10 mg by mouth daily.        . isosorbide dinitrate (ISORDIL) 20 MG tablet Take 0.5 tablets (10 mg total) by mouth daily. ( 1/2 tablet )  30 tablet  3  . metFORMIN (GLUCOPHAGE) 500 MG tablet TAKE 1 TABLET BY MOUTH THREE TIMES DAILY  270 tablet  3  . metoprolol (LOPRESSOR) 50 MG tablet TAKE 1/2 TABLET BY MOUTH TWICE DAILY  30 tablet  6  . nitroGLYCERIN (NITROSTAT) 0.4 MG SL tablet Place 1 tablet (0.4 mg total) under the tongue every 5 (five) minutes as needed.  25 tablet  11  . PLAVIX 75 MG tablet TAKE 1 TABLET BY MOUTH DAILY  90 tablet  3  . amLODipine (NORVASC) 5 MG tablet Take 1 tablet (5 mg total) by mouth daily.  30 tablet  11    Allergies  Allergen Reactions  . Lipitor (Atorvastatin Calcium)   . Lisinopril     Rash / cough. However patient is currently taking Lotensin with no problems    Past Medical History  Diagnosis Date  . Coronary artery disease   . Diabetes mellitus   .  Hypertension   . Hyperlipidemia   . History of ischemic heart disease   . Chest pain   . GERD (gastroesophageal reflux disease)   . Status post insertion of non-drug eluting coronary artery stent 10/05/2008  . CHF (congestive heart failure)   . Heart attack 08/1979    Past Surgical History  Procedure Date  . Coronary artery bypass graft 1999  . Cardiac catheterization 10/11/2008    History  Smoking status  . Never Smoker   Smokeless tobacco  . Never Used    History  Alcohol Use No    Family History  Problem Relation Age of Onset  . Stroke Daughter     09/2008  . Heart disease Father   . Heart attack Father   . Heart attack Brother   . Heart disease Mother     Review of Systems: The review of systems is positive for elevated blood pressure.  All other systems were reviewed and are negative.  Physical Exam: BP 172/88  Pulse 60  Ht 5\' 5"  (1.651 m)  Wt 145 lb 6.4  oz (65.953 kg)  BMI 24.20 kg/m2 Patient is very pleasant and in no acute distress. Skin is warm and dry. Color is normal.   HEENT is unremarkable. Normocephalic/atraumatic. PERRL. Sclera are nonicteric. Neck is supple. No masses. No JVD. Lungs are clear. Cardiac exam shows a regular rate and rhythm. Abdomen is soft. Extremities are without edema. Gait and ROM are intact. No gross neurologic deficits noted.   LABORATORY DATA:   Assessment / Plan:

## 2011-04-06 ENCOUNTER — Other Ambulatory Visit: Payer: Self-pay | Admitting: Cardiology

## 2011-04-27 ENCOUNTER — Encounter: Payer: Self-pay | Admitting: Nurse Practitioner

## 2011-04-27 ENCOUNTER — Telehealth: Payer: Self-pay | Admitting: *Deleted

## 2011-04-27 ENCOUNTER — Other Ambulatory Visit: Payer: Medicare Other | Admitting: *Deleted

## 2011-04-27 ENCOUNTER — Ambulatory Visit (INDEPENDENT_AMBULATORY_CARE_PROVIDER_SITE_OTHER): Payer: Medicare Other | Admitting: Nurse Practitioner

## 2011-04-27 DIAGNOSIS — I10 Essential (primary) hypertension: Secondary | ICD-10-CM

## 2011-04-27 DIAGNOSIS — I2510 Atherosclerotic heart disease of native coronary artery without angina pectoris: Secondary | ICD-10-CM

## 2011-04-27 DIAGNOSIS — I119 Hypertensive heart disease without heart failure: Secondary | ICD-10-CM

## 2011-04-27 DIAGNOSIS — R079 Chest pain, unspecified: Secondary | ICD-10-CM

## 2011-04-27 LAB — BASIC METABOLIC PANEL
BUN: 21 mg/dL (ref 6–23)
CO2: 27 mEq/L (ref 19–32)
Calcium: 9.4 mg/dL (ref 8.4–10.5)
Chloride: 105 mEq/L (ref 96–112)
Creatinine, Ser: 1.2 mg/dL (ref 0.4–1.5)
GFR: 64.97 mL/min (ref >60.00)
Glucose, Bld: 93 mg/dL (ref 70–99)
Potassium: 4.2 mEq/L (ref 3.5–5.1)
Sodium: 141 mEq/L (ref 135–145)

## 2011-04-27 MED ORDER — ISOSORBIDE DINITRATE 20 MG PO TABS: 10.0000 mg | ORAL_TABLET | Freq: Three times a day (TID) | ORAL | Status: DC

## 2011-04-27 MED ORDER — BENAZEPRIL HCL 20 MG PO TABS: 20.0000 mg | ORAL_TABLET | Freq: Two times a day (BID) | ORAL | Status: DC

## 2011-04-27 NOTE — Assessment & Plan Note (Signed)
Blood pressure is back up today. He did get some relief in the past from his chest pain when his blood pressure was lower. He has not tolerated Norvasc in the past. I have increased the Lotensin to BID. BMET is checked today. I will see him back in one week.

## 2011-04-27 NOTE — Telephone Encounter (Signed)
Message copied by Burnell Blanks on Mon Apr 27, 2011  4:35 PM ------      Message from: Rosalio Macadamia      Created: Mon Apr 27, 2011  3:20 PM       Ok to report. Labs are satisfactory.

## 2011-04-27 NOTE — Assessment & Plan Note (Signed)
He has had the return of chest burning with exertion. Isordil is increased to TID. I will see him back in a week. His wish is to not proceed with cardiac cath and wants medical management. He is to use NTG prn. He will go to the ER if needed. Patient is agreeable to this plan and will call if any problems develop in the interim.

## 2011-04-27 NOTE — Patient Instructions (Addendum)
We are going to increase the Isordil to three times a day.  We are going to increase the Lotensin (Benazepril) to two times a day  I will see you in a week. We are going to check your kidney and potassium level today.  Use your NTG under your tongue for recurrent chest pain. May take one tablet every 5 minutes. If you are still having discomfort after 3 tablets in 15 minutes, call 911.  Call for any problems.

## 2011-04-27 NOTE — Progress Notes (Signed)
Jerry Benson Date of Birth: 29-Oct-1928 Medical Record #[id number]  History of Present Illness: Jerry Benson comes back today for a follow up visit. It is a one month check. He is seen for Dr. Patty Sermons. He has known CAD with remote MI, followed by CABG as well as redo CABG. He had PCI to a SVG back in 2010. He has wanted to be managed medically. I saw him a couple of months ago with recurrent chest pain. Blood pressure was grossly elevated. We tried Norvasc but he did not tolerate. Today he comes back in for follow up. He has had to the return of chest discomfort. It is exertional related. It will go down his left arm. He has used some NTG with good relief. Blood pressure is back up. He has not had his medicines today. He wishes for medical management and does not want repeat cath at this time. No symptoms at rest.   Current Outpatient Prescriptions on File Prior to Visit  Medication Sig Dispense Refill  . ALPRAZolam (XANAX) 0.25 MG tablet Take 0.25 mg by mouth as needed.        Marland Kitchen aspirin 81 MG tablet Take 81 mg by mouth daily.        . CRESTOR 10 MG tablet TAKE  1/2 TABLET BY MOUTH DAILY  45 tablet  0  . famotidine (PEPCID) 40 MG tablet Take 1 tablet (40 mg total) by mouth as needed.  90 tablet  3  . glipiZIDE (GLUCOTROL XL) 10 MG 24 hr tablet TAKE 1 TABLET BY MOUTH EVERY DAY  30 tablet  6  . metFORMIN (GLUCOPHAGE) 500 MG tablet TAKE 1 TABLET BY MOUTH THREE TIMES DAILY  270 tablet  3  . metoprolol (LOPRESSOR) 50 MG tablet TAKE 1/2 TABLET BY MOUTH TWICE DAILY  30 tablet  6  . nitroGLYCERIN (NITROSTAT) 0.4 MG SL tablet Place 1 tablet (0.4 mg total) under the tongue every 5 (five) minutes as needed.  25 tablet  11  . PLAVIX 75 MG tablet TAKE 1 TABLET BY MOUTH DAILY  90 tablet  3  . DISCONTD: benazepril (LOTENSIN) 20 MG tablet Take 1 tablet (20 mg total) by mouth daily.  30 tablet  11  . DISCONTD: isosorbide dinitrate (ISORDIL) 20 MG tablet Take 0.5 tablets (10 mg total) by mouth daily. ( 1/2 tablet )  30  tablet  3    Allergies  Allergen Reactions  . Lipitor (Atorvastatin Calcium)   . Lisinopril     Rash / cough. However patient is currently taking Lotensin with no problems    Past Medical History  Diagnosis Date  . Coronary artery disease     Remote MI; Prior CAB, redo CABG in 2009 with PCI to SVG to DX and OM in 2010  . Diabetes mellitus   . Hypertension   . Hyperlipidemia   . Chest pain   . GERD (gastroesophageal reflux disease)   . Status post insertion of non-drug eluting coronary artery stent 10/05/2008  . CHF (congestive heart failure)   . Heart attack 08/1979  . S/P CABG (coronary artery bypass graft) 1999    Past Surgical History  Procedure Date  . Coronary artery bypass graft 1999  . Cardiac catheterization 10/11/2008    PCI to SVG to diagonal and OM in 2010    History  Smoking status  . Never Smoker   Smokeless tobacco  . Never Used    History  Alcohol Use No    Family History  Problem Relation Age of Onset  . Stroke Daughter     09/2008  . Heart disease Father   . Heart attack Father   . Heart attack Brother   . Heart disease Mother     Review of Systems: The review of systems is positive for chest burning with exertion. Blood pressure is back up at home. No shortness of breath. He does have some indigestion occasionally.  All other systems were reviewed and are negative.  Physical Exam: BP 180/80  Pulse 68  Ht 5\' 6"  (1.676 m)  Wt 142 lb 1.9 oz (64.465 kg)  BMI 22.94 kg/m2 Patient is very pleasant and in no acute distress. Skin is warm and dry. Color is normal.  HEENT is unremarkable. Normocephalic/atraumatic. PERRL. Sclera are nonicteric. Neck is supple. No masses. No JVD. Lungs are clear. Cardiac exam shows a regular rate and rhythm. Abdomen is soft. Extremities are without edema. Gait and ROM are intact. No gross neurologic deficits noted.   LABORATORY DATA: EKG shows old anterolateral MI. Diffuse ST and T wave changes. Does have T wave  inversion in V3 today.    Assessment / Plan:

## 2011-04-27 NOTE — Telephone Encounter (Signed)
Advised of results

## 2011-04-27 NOTE — Progress Notes (Signed)
Advised of labs 

## 2011-05-03 NOTE — Progress Notes (Signed)
Agree with plan 

## 2011-05-04 ENCOUNTER — Ambulatory Visit (INDEPENDENT_AMBULATORY_CARE_PROVIDER_SITE_OTHER): Payer: Medicare Other | Admitting: Nurse Practitioner

## 2011-05-04 ENCOUNTER — Encounter: Payer: Self-pay | Admitting: Nurse Practitioner

## 2011-05-04 ENCOUNTER — Other Ambulatory Visit: Payer: Self-pay | Admitting: Nurse Practitioner

## 2011-05-04 VITALS — BP 190/90 | HR 70 | Ht 66.0 in | Wt 143.0 lb

## 2011-05-04 DIAGNOSIS — I2510 Atherosclerotic heart disease of native coronary artery without angina pectoris: Secondary | ICD-10-CM

## 2011-05-04 DIAGNOSIS — I119 Hypertensive heart disease without heart failure: Secondary | ICD-10-CM

## 2011-05-04 DIAGNOSIS — I10 Essential (primary) hypertension: Secondary | ICD-10-CM

## 2011-05-04 MED ORDER — AMLODIPINE BESYLATE 5 MG PO TABS: 5.0000 mg | ORAL_TABLET | Freq: Every day | ORAL | Status: DC

## 2011-05-04 NOTE — Assessment & Plan Note (Signed)
I have talked with Dr. Patty Sermons. We will try him back on some Norvasc at 5 mg daily. He has this med at home. I will see him back on Friday. If his blood pressure does not improve, we will consider renal duplex. He needs to cut back the salt. No other changes in his medicines. Patient is agreeable to this plan and will call if any problems develop in the interim.

## 2011-05-04 NOTE — Patient Instructions (Signed)
Its your Isordil that makes your head hurt. This should get better in the next week or so.  Restart the Amlodipine at 5 mg daily. You have that at home. Take every day.  I will see you Friday to recheck your blood pressure. If its not a lot better, we are going to need to do a test on your kidneys.  Minimize your salt.  Call for any problems.

## 2011-05-04 NOTE — Assessment & Plan Note (Signed)
We will leave him on the Isordil at TID. I reassured him that his headache should improve. I asked him to use Tylenol instead. He wants medical management and does not want repeat cath.

## 2011-05-04 NOTE — Progress Notes (Signed)
Jerry Benson Date of Birth: 08-Jan-1929 Medical Record #[id number]  History of Present Illness: Jerry Benson is seen back today for a follow up visit. He is seen for Dr. Patty Sermons. Blood pressure has been up. He has had recurrent chest burning. He is opting for medical management. We increased his lisinopril to BID and increased his Isordil to TID.  He comes in today. He has a headache which I am sure is from the increased Isordil. Blood pressure remains up. He is using "lots of salt". Has had some Advil use. Chest burning may be the same, somewhat difficult to say.   Current Outpatient Prescriptions on File Prior to Visit  Medication Sig Dispense Refill  . ALPRAZolam (XANAX) 0.25 MG tablet Take 0.25 mg by mouth as needed.        Marland Kitchen aspirin 81 MG tablet Take 81 mg by mouth daily.        . benazepril (LOTENSIN) 20 MG tablet Take 1 tablet (20 mg total) by mouth 2 (two) times daily.  30 tablet  11  . CRESTOR 10 MG tablet TAKE  1/2 TABLET BY MOUTH DAILY  45 tablet  0  . famotidine (PEPCID) 40 MG tablet Take 1 tablet (40 mg total) by mouth as needed.  90 tablet  3  . glipiZIDE (GLUCOTROL XL) 10 MG 24 hr tablet TAKE 1 TABLET BY MOUTH EVERY DAY  30 tablet  6  . isosorbide dinitrate (ISORDIL) 20 MG tablet Take 0.5 tablets (10 mg total) by mouth 3 (three) times daily. ( 1/2 tablet )  30 tablet  3  . metFORMIN (GLUCOPHAGE) 500 MG tablet TAKE 1 TABLET BY MOUTH THREE TIMES DAILY  270 tablet  3  . metoprolol (LOPRESSOR) 50 MG tablet TAKE 1/2 TABLET BY MOUTH TWICE DAILY  30 tablet  6  . nitroGLYCERIN (NITROSTAT) 0.4 MG SL tablet Place 1 tablet (0.4 mg total) under the tongue every 5 (five) minutes as needed.  25 tablet  11  . PLAVIX 75 MG tablet TAKE 1 TABLET BY MOUTH DAILY  90 tablet  3    Allergies  Allergen Reactions  . Lipitor (Atorvastatin Calcium)   . Lisinopril     Rash / cough. However patient is currently taking Lotensin with no problems    Past Medical History  Diagnosis Date  . Coronary artery  disease     Remote MI; Prior CAB, redo CABG in 2009 with PCI to SVG to DX and OM in 2010  . Diabetes mellitus   . Hypertension   . Hyperlipidemia   . Chest pain   . GERD (gastroesophageal reflux disease)   . Status post insertion of non-drug eluting coronary artery stent 10/05/2008  . CHF (congestive heart failure)   . Heart attack 08/1979  . S/P CABG (coronary artery bypass graft) 1999    Past Surgical History  Procedure Date  . Coronary artery bypass graft 1999  . Cardiac catheterization 10/11/2008    PCI to SVG to diagonal and OM in 2010    History  Smoking status  . Never Smoker   Smokeless tobacco  . Never Used    History  Alcohol Use No    Family History  Problem Relation Age of Onset  . Stroke Daughter     09/2008  . Heart disease Father   . Heart attack Father   . Heart attack Brother   . Heart disease Mother     Review of Systems: The review of systems is per  the HPI.  All other systems were reviewed and are negative.  Physical Exam: BP 190/90  Pulse 70  Ht 5\' 6"  (1.676 m)  Wt 143 lb (64.864 kg)  BMI 23.08 kg/m2 Patient is very pleasant and in no acute distress. Skin is warm and dry. Color is normal.  HEENT is unremarkable. Normocephalic/atraumatic. PERRL. Sclera are nonicteric. Neck is supple. No masses. No JVD. Lungs are clear. Cardiac exam shows a regular rate and rhythm. Abdomen is soft. Extremities are without edema. Gait and ROM are intact. No gross neurologic deficits noted.  LABORATORY DATA:   Assessment / Plan:

## 2011-05-08 ENCOUNTER — Ambulatory Visit (INDEPENDENT_AMBULATORY_CARE_PROVIDER_SITE_OTHER): Payer: Medicare Other | Admitting: Nurse Practitioner

## 2011-05-08 ENCOUNTER — Encounter: Payer: Self-pay | Admitting: Nurse Practitioner

## 2011-05-08 VITALS — BP 170/80 | HR 56 | Ht 66.0 in | Wt 143.4 lb

## 2011-05-08 DIAGNOSIS — I119 Hypertensive heart disease without heart failure: Secondary | ICD-10-CM

## 2011-05-08 DIAGNOSIS — I2510 Atherosclerotic heart disease of native coronary artery without angina pectoris: Secondary | ICD-10-CM

## 2011-05-08 NOTE — Assessment & Plan Note (Signed)
Blood pressure is trending down. He will monitor at home. He is willing to stay on the Norvasc. He will call Juliette Alcide if he starts to feel bad. He is to watch the salt. He wishes to come back in about 3 to 4 months. Patient is agreeable to this plan and will call if any problems develop in the interim.

## 2011-05-08 NOTE — Assessment & Plan Note (Signed)
His chest burning is improved. He is to continue the long acting nitrates.

## 2011-05-08 NOTE — Progress Notes (Signed)
[name] Date of Birth: 28-Apr-1929 Medical Record #[id number]  History of Present Illness: Jerry Benson is seen today for a follow up visit. He is seen for Dr. Patty Sermons. He has had markedly elevated blood pressure and chest burning. We have put him back on Norvasc. He did not tolerate in the past due to some vague symptoms. He had had some excessive salt use and NSAID use. He was willing to retry. He is now doing better. He brings in some readings from home that show his blood pressure down to 140 to 150. He is trying to watch his salt. He has had less chest burning. He has refused repeat cath and wants medical management.   Current Outpatient Prescriptions on File Prior to Visit  Medication Sig Dispense Refill  . amLODipine (NORVASC) 5 MG tablet Take 1 tablet (5 mg total) by mouth daily.  30 tablet  11  . aspirin 81 MG tablet Take 81 mg by mouth daily.        . benazepril (LOTENSIN) 20 MG tablet Take 1 tablet (20 mg total) by mouth 2 (two) times daily.  30 tablet  11  . CRESTOR 10 MG tablet TAKE  1/2 TABLET BY MOUTH DAILY  45 tablet  0  . famotidine (PEPCID) 40 MG tablet Take 1 tablet (40 mg total) by mouth as needed.  90 tablet  3  . glipiZIDE (GLUCOTROL XL) 10 MG 24 hr tablet TAKE 1 TABLET BY MOUTH EVERY DAY  30 tablet  6  . isosorbide dinitrate (ISORDIL) 20 MG tablet Take 0.5 tablets (10 mg total) by mouth 3 (three) times daily. ( 1/2 tablet )  30 tablet  3  . metFORMIN (GLUCOPHAGE) 500 MG tablet TAKE 1 TABLET BY MOUTH THREE TIMES DAILY  270 tablet  3  . metoprolol (LOPRESSOR) 50 MG tablet TAKE 1/2 TABLET BY MOUTH TWICE DAILY  30 tablet  6  . nitroGLYCERIN (NITROSTAT) 0.4 MG SL tablet Place 1 tablet (0.4 mg total) under the tongue every 5 (five) minutes as needed.  25 tablet  11  . PLAVIX 75 MG tablet TAKE 1 TABLET BY MOUTH DAILY  90 tablet  3    Allergies  Allergen Reactions  . Lipitor (Atorvastatin Calcium)   . Lisinopril     Rash / cough. However patient is currently taking Lotensin  with no problems    Past Medical History  Diagnosis Date  . Coronary artery disease     Remote MI; Prior CAB, redo CABG in 2009 with PCI to SVG to DX and OM in 2010  . Diabetes mellitus   . Hypertension   . Hyperlipidemia   . Chest pain   . GERD (gastroesophageal reflux disease)   . Status post insertion of non-drug eluting coronary artery stent 10/05/2008  . CHF (congestive heart failure)   . Heart attack 08/1979  . S/P CABG (coronary artery bypass graft) 1999    Past Surgical History  Procedure Date  . Coronary artery bypass graft 1999  . Cardiac catheterization 10/11/2008    PCI to SVG to diagonal and OM in 2010    History  Smoking status  . Never Smoker   Smokeless tobacco  . Never Used    History  Alcohol Use No    Family History  Problem Relation Age of Onset  . Stroke Daughter     09/2008  . Heart disease Father   . Heart attack Father   . Heart attack Brother   .  Heart disease Mother     Review of Systems: The review of systems is per the HPI. Overall, he feels better. Blood pressure is trending down.  All other systems were reviewed and are negative.  Physical Exam: BP 170/80  Pulse 56  Ht 5\' 6"  (1.676 m)  Wt 143 lb 6.4 oz (65.046 kg)  BMI 23.15 kg/m2 Patient is very pleasant and in no acute distress. Skin is warm and dry. Color is normal.  HEENT is unremarkable. Normocephalic/atraumatic. PERRL. Sclera are nonicteric. Neck is supple. No masses. No JVD. Lungs are clear. Cardiac exam shows a regular rate and rhythm. Abdomen is soft. Extremities are without edema. Gait and ROM are intact. No gross neurologic deficits noted.   LABORATORY DATA:   Assessment / Plan:

## 2011-05-08 NOTE — Patient Instructions (Addendum)
Stay on your medicines  Keep a check of your blood pressure at home.  Call [facility] if you start to feel bad.  We will see you in 3 months.

## 2011-05-20 ENCOUNTER — Other Ambulatory Visit: Payer: Self-pay | Admitting: Cardiology

## 2011-06-10 ENCOUNTER — Encounter: Payer: Medicare Other | Admitting: Cardiology

## 2011-06-15 ENCOUNTER — Other Ambulatory Visit: Payer: Self-pay | Admitting: Cardiology

## 2011-06-15 NOTE — Telephone Encounter (Signed)
Please clarify direction / dosage to medication.

## 2011-06-16 MED ORDER — ISOSORBIDE DINITRATE 20 MG PO TABS: 10.0000 mg | ORAL_TABLET | Freq: Three times a day (TID) | ORAL | Status: DC

## 2011-06-16 NOTE — Telephone Encounter (Signed)
SPOKE WITH PHARMACY AND CLARIFIED DOSAGE OF IMDUR

## 2011-08-14 ENCOUNTER — Other Ambulatory Visit: Payer: Self-pay | Admitting: Cardiology

## 2011-08-14 NOTE — Telephone Encounter (Signed)
Refilled metoprolol 

## 2011-08-18 ENCOUNTER — Other Ambulatory Visit: Payer: Self-pay | Admitting: *Deleted

## 2011-08-18 ENCOUNTER — Ambulatory Visit (INDEPENDENT_AMBULATORY_CARE_PROVIDER_SITE_OTHER): Payer: Medicare Other | Admitting: Cardiology

## 2011-08-18 ENCOUNTER — Encounter: Payer: Self-pay | Admitting: Cardiology

## 2011-08-18 VITALS — BP 192/90 | HR 66 | Ht 66.0 in | Wt 146.0 lb

## 2011-08-18 DIAGNOSIS — E7800 Pure hypercholesterolemia, unspecified: Secondary | ICD-10-CM

## 2011-08-18 DIAGNOSIS — E119 Type 2 diabetes mellitus without complications: Secondary | ICD-10-CM

## 2011-08-18 DIAGNOSIS — I119 Hypertensive heart disease without heart failure: Secondary | ICD-10-CM

## 2011-08-18 MED ORDER — ISOSORBIDE DINITRATE 20 MG PO TABS: 10.0000 mg | ORAL_TABLET | Freq: Three times a day (TID) | ORAL | Status: DC

## 2011-08-18 MED ORDER — BENAZEPRIL-HYDROCHLOROTHIAZIDE 20-12.5 MG PO TABS: 1.0000 | ORAL_TABLET | Freq: Every day | ORAL | Status: DC

## 2011-08-18 NOTE — Assessment & Plan Note (Signed)
Patient remains on oral agents.  He is not been having any hypoglycemic episodes.  We will plan to check an A1c on his next visit.

## 2011-08-18 NOTE — Assessment & Plan Note (Signed)
The patient does have stable exertional angina pectoris relieved by rest or by sublingual nitroglycerin.  His chest discomfort radiates down his left arm.

## 2011-08-18 NOTE — Patient Instructions (Signed)
STOP BENAZEPRIL PLAIN AND START BENAZEPRIL HCT 20/12.5 MG DAILY, A NEW RX SENT TO [facility]  Your physician recommends that you schedule a follow-up appointment in: 2 weeks with Lawson Fiscal NP or  Dr. Patty Sermons for office visit and fasting labs (LP/BMET/HFP)

## 2011-08-18 NOTE — Assessment & Plan Note (Signed)
The patient's blood pressure has been poorly controlled recently.  On arrival today his systolic was more than 200.  We reviewed his medications in detail.  It turns out that he has been taking only 10 mg of benazepril once a day rather than 20 mg twice a day.Marland Kitchen also he stopped taking the amlodipine because he gave him a headache.

## 2011-08-18 NOTE — Progress Notes (Signed)
Jerry Benson Date of Birth:  [birth year] [facility] [address] [place], [place]  [address] [phone]         Fax   [phone]  History of Present Illness: This pleasant 78 year old gentleman is seen for a scheduled followup office visit.  He has a complex past medical history.  He has a history of known ischemic heart disease.  He has a history of a remote myocardial infarction.  He had bypass in 1999 and a redo in 2009 and he had cardiac catheterization in 2010 with PCI of a saphenous vein graft to the diagonal and to the obtuse marginal sequentially.  The patient has had exertional angina pectoris.  He has also had poorly controlled blood pressure.  Patient has hypercholesterolemia and is diabetic.  Current Outpatient Prescriptions  Medication Sig Dispense Refill  . aspirin 81 MG tablet Take 81 mg by mouth daily.        . CRESTOR 10 MG tablet TAKE  1/2 TABLET BY MOUTH DAILY  45 tablet  6  . famotidine (PEPCID) 40 MG tablet Take 1 tablet (40 mg total) by mouth as needed.  90 tablet  3  . glipiZIDE (GLUCOTROL XL) 10 MG 24 hr tablet TAKE 1 TABLET BY MOUTH EVERY DAY  30 tablet  6  . isosorbide dinitrate (ISORDIL) 20 MG tablet Take 0.5 tablets (10 mg total) by mouth 3 (three) times daily. ( 1/2 tablet )  45 tablet  11  . metFORMIN (GLUCOPHAGE) 500 MG tablet TAKE 1 TABLET BY MOUTH THREE TIMES DAILY  270 tablet  3  . metoprolol (LOPRESSOR) 50 MG tablet TAKE 1/2 TABLET BY MOUTH TWICE DAILY  30 tablet  6  . nitroGLYCERIN (NITROSTAT) 0.4 MG SL tablet Place 1 tablet (0.4 mg total) under the tongue every 5 (five) minutes as needed.  25 tablet  11  . PLAVIX 75 MG tablet TAKE 1 TABLET BY MOUTH DAILY  90 tablet  3  . DISCONTD: isosorbide dinitrate (ISORDIL) 20 MG tablet Take 0.5 tablets (10 mg total) by mouth 3 (three) times daily. ( 1/2 tablet )  30 tablet  3  . benazepril-hydrochlorthiazide (LOTENSIN HCT) 20-12.5 MG per tablet Take 1 tablet by mouth daily.  30 tablet  11     Allergies  Allergen Reactions  . Amlodipine     headache  . Lipitor (Atorvastatin Calcium)   . Lisinopril     Rash / cough. However patient is currently taking Lotensin with no problems    Patient Active Problem List  Diagnoses  . Diabetes mellitus  . Hypercholesterolemia  . Hx of CABG  . Benign hypertensive heart disease without heart failure  . Dyspepsia  . CAD (coronary artery disease)    History  Smoking status  . Never Smoker   Smokeless tobacco  . Never Used    History  Alcohol Use No    Family History  Problem Relation Age of Onset  . Stroke Daughter     09/2008  . Heart disease Father   . Heart attack Father   . Heart attack Brother   . Heart disease Mother     Review of Systems: Constitutional: no fever chills diaphoresis or fatigue or change in weight.  Head and neck: no hearing loss, no epistaxis, no photophobia or visual disturbance. Respiratory: No cough, shortness of breath or wheezing. Cardiovascular: No chest pain peripheral edema, palpitations. Gastrointestinal: No abdominal distention, no abdominal pain, no change in bowel habits hematochezia or melena.  Genitourinary: No dysuria, no frequency, no urgency, no nocturia. Musculoskeletal:No arthralgias, no back pain, no gait disturbance or myalgias. Neurological: No dizziness, no headaches, no numbness, no seizures, no syncope, no weakness, no tremors. Hematologic: No lymphadenopathy, no easy bruising. Psychiatric: No confusion, no hallucinations, no sleep disturbance.    Physical Exam: Filed Vitals:   08/18/11 1119  BP: 192/90  Pulse: 66   The general appearance reveals a well-developed elderly gentleman in no acute distress.  He is a little unclear as to which medicines he is taking.Pupils equal and reactive.   Extraocular Movements are full.  There is no scleral icterus.  The mouth and pharynx are normal.  The neck is supple.  The carotids reveal no bruits.  The jugular venous  pressure is normal.  The thyroid is not enlarged.  There is no lymphadenopathy.  The chest is clear to percussion and auscultation. There are no rales or rhonchi. Expansion of the chest is symmetrical.  Heart reveals a soft systolic ejection murmur at the base.The abdomen is soft and nontender. Bowel sounds are normal. The liver and spleen are not enlarged. There Are no abdominal masses. There are no bruits.  The pedal pulses are good.  There is no phlebitis or edema.  There is no cyanosis or clubbing. Strength is normal and symmetrical in all extremities.  There is no lateralizing weakness.  There are no sensory deficits.  The skin is warm and dry.  There is no rash.   Assessment / Plan: For better blood pressure control we are changing his benazepril to benazepril HCT 20/12.5 one daily.  He is to return in 2 weeks for followup office visit at which time we will get fasting lipid panel hepatic function panel basal metabolic panel and hemoglobin W2N and CBC.

## 2011-09-02 ENCOUNTER — Other Ambulatory Visit: Payer: Medicare Other

## 2011-09-02 ENCOUNTER — Ambulatory Visit (INDEPENDENT_AMBULATORY_CARE_PROVIDER_SITE_OTHER): Payer: Medicare Other | Admitting: Nurse Practitioner

## 2011-09-02 ENCOUNTER — Encounter: Payer: Self-pay | Admitting: Nurse Practitioner

## 2011-09-02 ENCOUNTER — Ambulatory Visit: Payer: Medicare Other | Admitting: Cardiology

## 2011-09-02 VITALS — BP 144/90 | HR 64 | Ht 66.0 in | Wt 142.0 lb

## 2011-09-02 DIAGNOSIS — E7800 Pure hypercholesterolemia, unspecified: Secondary | ICD-10-CM

## 2011-09-02 DIAGNOSIS — I119 Hypertensive heart disease without heart failure: Secondary | ICD-10-CM

## 2011-09-02 DIAGNOSIS — E119 Type 2 diabetes mellitus without complications: Secondary | ICD-10-CM

## 2011-09-02 DIAGNOSIS — K3189 Other diseases of stomach and duodenum: Secondary | ICD-10-CM

## 2011-09-02 DIAGNOSIS — R5381 Other malaise: Secondary | ICD-10-CM

## 2011-09-02 DIAGNOSIS — I2510 Atherosclerotic heart disease of native coronary artery without angina pectoris: Secondary | ICD-10-CM

## 2011-09-02 DIAGNOSIS — R1013 Epigastric pain: Secondary | ICD-10-CM

## 2011-09-02 LAB — LIPID PANEL
HDL: 42.7 mg/dL (ref >39.00)
Total CHOL/HDL Ratio: 3
VLDL: 26.8 mg/dL (ref 0.0–40.0)

## 2011-09-02 LAB — CBC WITH DIFFERENTIAL/PLATELET
Basophils Absolute: 0 10*3/uL (ref 0.0–0.1)
Lymphocytes Relative: 19.9 % (ref 12.0–46.0)
Monocytes Relative: 7 % (ref 3.0–12.0)
Platelets: 138 10*3/uL — ABNORMAL LOW (ref 150.0–400.0)
RDW: 12.8 % (ref 11.5–14.6)

## 2011-09-02 LAB — HEPATIC FUNCTION PANEL
AST: 23 U/L (ref 0–37)
Alkaline Phosphatase: 56 U/L (ref 39–117)
Bilirubin, Direct: 0.1 mg/dL (ref 0.0–0.3)
Total Protein: 6.9 g/dL (ref 6.0–8.3)

## 2011-09-02 LAB — BASIC METABOLIC PANEL
CO2: 29 mEq/L (ref 19–32)
Calcium: 9.6 mg/dL (ref 8.4–10.5)
GFR: 70.54 mL/min (ref >60.00)
Potassium: 4 mEq/L (ref 3.5–5.1)
Sodium: 139 mEq/L (ref 135–145)

## 2011-09-02 LAB — HEMOGLOBIN A1C: Hgb A1c MFr Bld: 7.3 % — ABNORMAL HIGH (ref 4.6–6.5)

## 2011-09-02 MED ORDER — ISOSORBIDE DINITRATE 20 MG PO TABS: 20.0000 mg | ORAL_TABLET | Freq: Three times a day (TID) | ORAL | Status: DC

## 2011-09-02 MED ORDER — PANTOPRAZOLE SODIUM 40 MG PO TBEC: 40.0000 mg | DELAYED_RELEASE_TABLET | Freq: Every day | ORAL | Status: DC

## 2011-09-02 NOTE — Assessment & Plan Note (Signed)
His blood pressure has trended down and is in a more acceptable range.

## 2011-09-02 NOTE — Progress Notes (Signed)
Addended by: Alma Friendly on: 09/02/2011 09:31 AM   Modules accepted: Orders

## 2011-09-02 NOTE — Assessment & Plan Note (Signed)
Continues with exertional chest burning. I have increased his Isordil to 20 mg TID. We will see him back in 2 weeks. He will continue to use his sl NTG prn. Patient is agreeable to this plan and will call if any problems develop in the interim.

## 2011-09-02 NOTE — Progress Notes (Signed)
Jerry Benson Date of Birth: [birth year] Medical Record #[id number]  History of Present Illness: Jerry Benson is seen back today for a 2 week check. He is seen for Jerry Benson. He has know extensive CAD and is now managed medically. He has had remote MI with CABG in 1999 and redo CABG in 2009, PCI in 2010 to the SVG to the DX and to the OM sequentially. He has had exertional angina. He opts for continued medical management. Blood pressure was grossly elevated at his last visit. He was switched over to Lisinopril HCT. He does not tolerate Norvasc due to headaches.  He comes in today. His blood pressure is trending back down. He does report more exertional burning chest discomfort. He continues to use sl NTG. He also has started burping more.   Current Outpatient Prescriptions on File Prior to Visit  Medication Sig Dispense Refill  . aspirin 81 MG tablet Take 81 mg by mouth daily.        . benazepril-hydrochlorthiazide (LOTENSIN HCT) 20-12.5 MG per tablet Take 1 tablet by mouth daily.  30 tablet  11  . CRESTOR 10 MG tablet TAKE  1/2 TABLET BY MOUTH DAILY  45 tablet  6  . famotidine (PEPCID) 40 MG tablet Take 1 tablet (40 mg total) by mouth as needed.  90 tablet  3  . glipiZIDE (GLUCOTROL XL) 10 MG 24 hr tablet TAKE 1 TABLET BY MOUTH EVERY DAY  30 tablet  6  . isosorbide dinitrate (ISORDIL) 20 MG tablet Take 0.5 tablets (10 mg total) by mouth 3 (three) times daily. ( 1/2 tablet )  45 tablet  11  . metFORMIN (GLUCOPHAGE) 500 MG tablet TAKE 1 TABLET BY MOUTH THREE TIMES DAILY  270 tablet  3  . metoprolol (LOPRESSOR) 50 MG tablet TAKE 1/2 TABLET BY MOUTH TWICE DAILY  30 tablet  6  . nitroGLYCERIN (NITROSTAT) 0.4 MG SL tablet Place 1 tablet (0.4 mg total) under the tongue every 5 (five) minutes as needed.  25 tablet  11  . PLAVIX 75 MG tablet TAKE 1 TABLET BY MOUTH DAILY  90 tablet  3    Allergies  Allergen Reactions  . Amlodipine     headache  . Lipitor (Atorvastatin Calcium)   . Lisinopril    Rash / cough. However patient is currently taking Lotensin with no problems    Past Medical History  Diagnosis Date  . Coronary artery disease     Remote MI; Prior CAB, redo CABG in 2009 with PCI to SVG to DX and OM in 2010  . Diabetes mellitus   . Hypertension   . Hyperlipidemia   . Chest pain   . GERD (gastroesophageal reflux disease)   . Status post insertion of non-drug eluting coronary artery stent 10/05/2008  . CHF (congestive heart failure)   . Heart attack 08/1979  . S/P CABG (coronary artery bypass graft) 1999    Past Surgical History  Procedure Date  . Coronary artery bypass graft 1999  . Cardiac catheterization 10/11/2008    PCI to SVG to diagonal and OM in 2010    History  Smoking status  . Never Smoker   Smokeless tobacco  . Never Used    History  Alcohol Use No    Family History  Problem Relation Age of Onset  . Stroke Daughter     09/2008  . Heart disease Father   . Heart attack Father   . Heart attack Brother   . Heart  disease Mother     Review of Systems: The review of systems is positive for chronic angina.  All other systems were reviewed and are negative.  Physical Exam: BP 132/72  Pulse 64  Ht 5\' 6"  (1.676 m)  Wt 142 lb (64.411 kg)  BMI 22.92 kg/m2 Repeat blood pressure by me is 144/90. Patient is very pleasant and in no acute distress. Skin is warm and dry. Color is normal.  HEENT is unremarkable. Normocephalic/atraumatic. PERRL. Sclera are nonicteric. Neck is supple. No masses. No JVD. Lungs are clear. Cardiac exam shows a regular rate and rhythm. Soft systolic murmur noted.  Abdomen is soft. Extremities are without edema. Gait and ROM are intact. No gross neurologic deficits noted.  LABORATORY DATA: PENDING  Assessment / Plan:

## 2011-09-02 NOTE — Assessment & Plan Note (Signed)
I have stopped his Pepcid. We will try Protonix 40 mg daily.

## 2011-09-02 NOTE — Patient Instructions (Signed)
Your blood pressure is better.  We are going to check your labs today.  I am increasing your Isordil to a whole tablet three times a day.  I am also putting you on some stomach medicine - Protonix 40 mg each day.   Stop your Pepcid.  I will see you in 2 weeks.  Call the [facility] office at [phone] if you have any questions, problems or concerns.

## 2011-09-02 NOTE — Progress Notes (Signed)
Quick Note:  Please report to patient. The recent labs are stable. Continue same medication and careful diet. The triglycerides are better at this time. ______

## 2011-09-03 ENCOUNTER — Telehealth: Payer: Self-pay | Admitting: *Deleted

## 2011-09-03 NOTE — Telephone Encounter (Signed)
Message copied by Burnell Blanks on Thu Sep 03, 2011  2:55 PM ------      Message from: Cassell Clement      Created: Wed Sep 02, 2011  5:23 PM       Please report to patient.  The recent labs are stable. Continue same medication and careful diet.  The triglycerides are better at this time.

## 2011-09-03 NOTE — Telephone Encounter (Signed)
Spoke with wife and told her would just mail him a copy and to call if any questions

## 2011-09-16 ENCOUNTER — Other Ambulatory Visit: Payer: Self-pay | Admitting: Nurse Practitioner

## 2011-09-16 ENCOUNTER — Ambulatory Visit (INDEPENDENT_AMBULATORY_CARE_PROVIDER_SITE_OTHER): Payer: Medicare Other | Admitting: Nurse Practitioner

## 2011-09-16 ENCOUNTER — Encounter (HOSPITAL_COMMUNITY): Payer: Self-pay | Admitting: Pharmacy Technician

## 2011-09-16 ENCOUNTER — Encounter: Payer: Self-pay | Admitting: Nurse Practitioner

## 2011-09-16 VITALS — BP 168/82 | HR 68 | Ht 66.0 in | Wt 146.0 lb

## 2011-09-16 DIAGNOSIS — Z01810 Encounter for preprocedural cardiovascular examination: Secondary | ICD-10-CM

## 2011-09-16 DIAGNOSIS — I2510 Atherosclerotic heart disease of native coronary artery without angina pectoris: Secondary | ICD-10-CM

## 2011-09-16 DIAGNOSIS — R0789 Other chest pain: Secondary | ICD-10-CM

## 2011-09-16 LAB — CBC WITH DIFFERENTIAL/PLATELET
Basophils Absolute: 0 10*3/uL (ref 0.0–0.1)
Basophils Relative: 0.3 % (ref 0.0–3.0)
Eosinophils Absolute: 0.2 10*3/uL (ref 0.0–0.7)
Eosinophils Relative: 4 % (ref 0.0–5.0)
HCT: 35.7 % — ABNORMAL LOW (ref 39.0–52.0)
Hemoglobin: 11.7 g/dL — ABNORMAL LOW (ref 13.0–17.0)
Lymphocytes Relative: 21.1 % (ref 12.0–46.0)
Lymphs Abs: 1.2 10*3/uL (ref 0.7–4.0)
MCHC: 32.8 g/dL (ref 30.0–36.0)
MCV: 90.3 fl (ref 78.0–100.0)
Monocytes Absolute: 0.4 10*3/uL (ref 0.1–1.0)
Monocytes Relative: 7.4 % (ref 3.0–12.0)
Neutro Abs: 3.9 10*3/uL (ref 1.4–7.7)
Neutrophils Relative %: 67.2 % (ref 43.0–77.0)
Platelets: 129 10*3/uL — ABNORMAL LOW (ref 150.0–400.0)
RBC: 3.95 Mil/uL — ABNORMAL LOW (ref 4.22–5.81)
RDW: 12.9 % (ref 11.5–14.6)
WBC: 5.8 10*3/uL (ref 4.5–10.5)

## 2011-09-16 LAB — BASIC METABOLIC PANEL
BUN: 30 mg/dL — ABNORMAL HIGH (ref 6–23)
CO2: 26 mEq/L (ref 19–32)
Calcium: 9.1 mg/dL (ref 8.4–10.5)
Chloride: 102 mEq/L (ref 96–112)
Creatinine, Ser: 1.3 mg/dL (ref 0.4–1.5)
GFR: 54.41 mL/min — ABNORMAL LOW (ref >60.00)
Glucose, Bld: 153 mg/dL — ABNORMAL HIGH (ref 70–99)
Potassium: 4.3 mEq/L (ref 3.5–5.1)
Sodium: 138 mEq/L (ref 135–145)

## 2011-09-16 LAB — PROTIME-INR
INR: 0.9 ratio (ref 0.8–1.0)
Prothrombin Time: 10.3 s (ref 10.2–12.4)

## 2011-09-16 LAB — APTT: aPTT: 28.7 s (ref 21.7–28.8)

## 2011-09-16 MED ORDER — SODIUM CHLORIDE 0.9 % IV SOLN: INTRAVENOUS | Status: DC

## 2011-09-16 NOTE — Assessment & Plan Note (Signed)
Patient presents with worsening angina. No symptoms at rest. He is complaining of a little rash with the higher dose of Isordil. Does not tolerate Norvasc. I think it is time to proceed on with repeat cardiac catheterization. I have explained the procedure to him. He is willing to proceed. Have scheduled for Friday LCP[facility] with Dr. Riley Kill. Metformin will be held after tomorrow. He will hold his Glucotrol Friday am. Will check CXR on Friday at [facility]. Labs are done here today. Patient is agreeable to this plan and will call if any problems develop in the interim.

## 2011-09-16 NOTE — Patient Instructions (Signed)
Stay on your current medicines.  We are going to arrange for a heart catheterization.   You are scheduled for a cardiac catheterization on Friday, March 15th with Dr. Riley Kill or associate.  Go to [facility] 2nd Floor Short Stay on Friday at 8:30am. No food or drink after midnight on Thursday. You may take your medications with a sip of water on the day of your procedure.   STOP taking your Metformin after tomorrow. Do not take your sugar pills on Friday.   Coronary Angiography Coronary angiography is an X-ray procedure used to look at the arteries in the heart. In this procedure, a dye is injected through a long, hollow tube (catheter). The catheter is about the size of a piece of cooked spaghetti. The catheter injects a dye into an artery in your groin. X-rays are then taken to show if there is a blockage in the arteries of your heart. BEFORE THE PROCEDURE   Let your caregiver know if you have allergies to shellfish or contrast dye. Also let your caregiver know if you have kidney problems or failure.   Do not eat or drink starting from midnight up to the time of the procedure, or as directed.   You may drink enough water to take your medications the morning of the procedure if you were instructed to do so.   You should be at the hospital or outpatient facility where the procedure is to be done 60 minutes prior to the procedure or as directed.  PROCEDURE  You may be given an IV medication to help you relax before the procedure.   You will be prepared for the procedure by washing and shaving the area where the catheter will be inserted. This is usually done in the groin but may be done in the fold of your arm by your elbow.   A medicine will be given to numb your groin where the catheter will be inserted.   A specially trained doctor will insert the catheter into an artery in your groin. The catheter is guided by using a special type of X-ray (fluoroscopy) to the blood vessel being  examined.   A special dye is then injected into the catheter and X-rays are taken. The dye helps to show where any narrowing or blockages are located in the heart arteries.  AFTER THE PROCEDURE   After the procedure you will be kept in bed lying flat for several hours. You will be instructed to not bend or cross your legs.   The groin insertion site will be watched and checked frequently.   The pulse in your feet will be checked frequently.   Additional blood tests, X-rays and an EKG may be done.   You may stay in the hospital overnight for observation.  SEEK IMMEDIATE MEDICAL CARE IF:   You develop chest pain, shortness of breath, feel faint, or pass out.   There is bleeding, swelling, or drainage from the catheter insertion site.   You develop pain, discoloration, coldness, or severe bruising in the leg or area where the catheter was inserted.   You have a fever.  Document Released: 12/27/2002 Document Revised: 06/11/2011 Document Reviewed: 02/15/2008 [facility] Patient Information 2012 [place], [place].

## 2011-09-16 NOTE — Progress Notes (Signed)
 Jerry Benson Date of Birth: 02/05/1929 Medical Record #[id number]  History of Present Illness: Mr. Simmers is seen back today for a 2 week check. He is seen for Dr. [name]. He has known extensive CAD and has been managed medically. He had remote MI with CABG in 1981 with redo CABG in 2009, PCI in 2010 to the SVG to the DX and to the OM sequentially. He has been having exertional angina. Blood pressure has been elevated. He does not tolerate Norvasc due to headaches. He has been switched over to Lisinopril Hct. At his last visit, his blood pressure was better but he was having more angina. We increased his Isordil and added PPI as well.   He comes in today. He still is having this burning in his chest. All exertional in origin. Relieved with rest. He is now using more sl NTG. Not able to do his walking any more. He is quite reluctant to proceed with cath but admits that he is not getting any better. He has had no medicines today. He has a little rash on both of his arms that he thinks is due to the higher dose of Isordil.   Current Outpatient Prescriptions on File Prior to Visit  Medication Sig Dispense Refill  . aspirin 81 MG tablet Take 81 mg by mouth daily.        . benazepril-hydrochlorthiazide (LOTENSIN HCT) 20-12.5 MG per tablet Take 1 tablet by mouth daily.  30 tablet  11  . CRESTOR 10 MG tablet TAKE  1/2 TABLET BY MOUTH DAILY  45 tablet  6  . glipiZIDE (GLUCOTROL XL) 10 MG 24 hr tablet TAKE 1 TABLET BY MOUTH EVERY DAY  30 tablet  6  . isosorbide dinitrate (ISORDIL) 20 MG tablet Take 1 tablet (20 mg total) by mouth 3 (three) times daily. ( 1/2 tablet )  90 tablet  11  . metFORMIN (GLUCOPHAGE) 500 MG tablet TAKE 1 TABLET BY MOUTH THREE TIMES DAILY  270 tablet  3  . metoprolol (LOPRESSOR) 50 MG tablet TAKE 1/2 TABLET BY MOUTH TWICE DAILY  30 tablet  6  . nitroGLYCERIN (NITROSTAT) 0.4 MG SL tablet Place 1 tablet (0.4 mg total) under the tongue every 5 (five) minutes as needed.  25 tablet  11    . pantoprazole (PROTONIX) 40 MG tablet Take 1 tablet (40 mg total) by mouth daily.  30 tablet  6  . PLAVIX 75 MG tablet TAKE 1 TABLET BY MOUTH DAILY  90 tablet  3    Allergies  Allergen Reactions  . Amlodipine     headache  . Lipitor (Atorvastatin Calcium)   . Lisinopril     Rash / cough. However patient is currently taking Lotensin with no problems    Past Medical History  Diagnosis Date  . Coronary artery disease     Remote MI; Prior CAB, redo CABG in 2009 with PCI to SVG to DX and OM in 2010  . Diabetes mellitus   . Hypertension   . Hyperlipidemia   . Chest pain   . GERD (gastroesophageal reflux disease)   . Status post insertion of non-drug eluting coronary artery stent 10/05/2008  . CHF (congestive heart failure)   . Heart attack 08/1979  . S/P CABG (coronary artery bypass graft) 1999    Past Surgical History  Procedure Date  . Coronary artery bypass graft 1999  . Cardiac catheterization 10/11/2008    PCI to SVG to diagonal and OM in 2010      History  Smoking status  . Never Smoker   Smokeless tobacco  . Never Used    History  Alcohol Use No    Family History  Problem Relation Age of Onset  . Stroke Daughter     09/2008  . Heart disease Father   . Heart attack Father   . Heart attack Brother   . Heart disease Mother     Review of Systems: The review of systems is per the HPI.  All other systems were reviewed and are negative.  Physical Exam: BP 168/82  Pulse 68  Ht 5' 6" (1.676 m)  Wt 146 lb (66.225 kg)  BMI 23.56 kg/m2 Patient is very pleasant and in no acute distress. Skin is warm and dry. Color is normal.  HEENT is unremarkable. Normocephalic/atraumatic. PERRL. Sclera are nonicteric. Neck is supple. No masses. No JVD. Lungs are clear. Cardiac exam shows a regular rate and rhythm. Abdomen is soft. Extremities are without edema. Gait and ROM are intact. No gross neurologic deficits noted.   LABORATORY DATA: PENDING   Assessment / Plan:  

## 2011-09-17 ENCOUNTER — Telehealth: Payer: Self-pay | Admitting: *Deleted

## 2011-09-17 NOTE — Telephone Encounter (Signed)
Message copied by Burnell Blanks on Thu Sep 17, 2011  9:31 AM ------      Message from: Rosalio Macadamia      Created: Wed Sep 16, 2011  3:58 PM       Ok to report. Labs are satisfactory except for BUN being up. This has been reviewed with Dr. Patty Sermons. Needs to drink more water. No NSAIDs. Ask him to show up earlier on Friday to start his IV fluids earlier (7am). Recheck BMET on Friday am.

## 2011-09-17 NOTE — Telephone Encounter (Signed)
Advised patient, verbalized understanding  

## 2011-09-18 ENCOUNTER — Other Ambulatory Visit: Payer: Self-pay

## 2011-09-18 ENCOUNTER — Ambulatory Visit (HOSPITAL_COMMUNITY)
Admission: RE | Admit: 2011-09-18 | Discharge: 2011-09-18 | Disposition: A | Discharge status: Home / Self Care | Payer: Medicare Other | Source: Ambulatory Visit | Attending: Nurse Practitioner | Admitting: Nurse Practitioner

## 2011-09-18 ENCOUNTER — Encounter (HOSPITAL_COMMUNITY)
Admission: RE | Disposition: A | Discharge status: Home / Self Care | Payer: Self-pay | Source: Ambulatory Visit | Attending: Cardiology

## 2011-09-18 ENCOUNTER — Encounter (HOSPITAL_COMMUNITY): Payer: Self-pay | Admitting: Cardiology

## 2011-09-18 ENCOUNTER — Inpatient Hospital Stay (HOSPITAL_COMMUNITY)
Admission: RE | Admit: 2011-09-18 | Discharge: 2011-09-22 | DRG: 247 | Disposition: A | Discharge status: Home / Self Care | Payer: Medicare Other | Source: Ambulatory Visit | Attending: Cardiology | Admitting: Cardiology

## 2011-09-18 DIAGNOSIS — I252 Old myocardial infarction: Secondary | ICD-10-CM

## 2011-09-18 DIAGNOSIS — I119 Hypertensive heart disease without heart failure: Secondary | ICD-10-CM | POA: Insufficient documentation

## 2011-09-18 DIAGNOSIS — Z7902 Long term (current) use of antithrombotics/antiplatelets: Secondary | ICD-10-CM

## 2011-09-18 DIAGNOSIS — I25810 Atherosclerosis of coronary artery bypass graft(s) without angina pectoris: Secondary | ICD-10-CM | POA: Diagnosis present

## 2011-09-18 DIAGNOSIS — E119 Type 2 diabetes mellitus without complications: Secondary | ICD-10-CM | POA: Diagnosis present

## 2011-09-18 DIAGNOSIS — T82897A Other specified complication of cardiac prosthetic devices, implants and grafts, initial encounter: Secondary | ICD-10-CM | POA: Diagnosis present

## 2011-09-18 DIAGNOSIS — I200 Unstable angina: Secondary | ICD-10-CM

## 2011-09-18 DIAGNOSIS — I1310 Hypertensive heart and chronic kidney disease without heart failure, with stage 1 through stage 4 chronic kidney disease, or unspecified chronic kidney disease: Secondary | ICD-10-CM | POA: Diagnosis present

## 2011-09-18 DIAGNOSIS — I2510 Atherosclerotic heart disease of native coronary artery without angina pectoris: Principal | ICD-10-CM | POA: Diagnosis present

## 2011-09-18 DIAGNOSIS — Z01810 Encounter for preprocedural cardiovascular examination: Secondary | ICD-10-CM

## 2011-09-18 DIAGNOSIS — Z823 Family history of stroke: Secondary | ICD-10-CM

## 2011-09-18 DIAGNOSIS — Z7982 Long term (current) use of aspirin: Secondary | ICD-10-CM

## 2011-09-18 DIAGNOSIS — Z8249 Family history of ischemic heart disease and other diseases of the circulatory system: Secondary | ICD-10-CM

## 2011-09-18 DIAGNOSIS — Y92009 Unspecified place in unspecified non-institutional (private) residence as the place of occurrence of the external cause: Secondary | ICD-10-CM

## 2011-09-18 DIAGNOSIS — E7800 Pure hypercholesterolemia, unspecified: Secondary | ICD-10-CM | POA: Diagnosis present

## 2011-09-18 DIAGNOSIS — K219 Gastro-esophageal reflux disease without esophagitis: Secondary | ICD-10-CM | POA: Diagnosis present

## 2011-09-18 DIAGNOSIS — N183 Chronic kidney disease, stage 3 unspecified: Secondary | ICD-10-CM | POA: Diagnosis present

## 2011-09-18 DIAGNOSIS — E785 Hyperlipidemia, unspecified: Secondary | ICD-10-CM | POA: Diagnosis present

## 2011-09-18 DIAGNOSIS — E1159 Type 2 diabetes mellitus with other circulatory complications: Secondary | ICD-10-CM | POA: Insufficient documentation

## 2011-09-18 DIAGNOSIS — Y840 Cardiac catheterization as the cause of abnormal reaction of the patient, or of later complication, without mention of misadventure at the time of the procedure: Secondary | ICD-10-CM | POA: Diagnosis present

## 2011-09-18 DIAGNOSIS — Z9861 Coronary angioplasty status: Secondary | ICD-10-CM

## 2011-09-18 DIAGNOSIS — Z888 Allergy status to other drugs, medicaments and biological substances status: Secondary | ICD-10-CM

## 2011-09-18 DIAGNOSIS — Z79899 Other long term (current) drug therapy: Secondary | ICD-10-CM

## 2011-09-18 HISTORY — PX: LEFT HEART CATHETERIZATION WITH CORONARY/GRAFT ANGIOGRAM: SHX5450

## 2011-09-18 HISTORY — DX: Chronic kidney disease, stage 3 (moderate): N18.3

## 2011-09-18 HISTORY — DX: Chronic kidney disease, stage 3 unspecified: N18.30

## 2011-09-18 LAB — GLUCOSE, CAPILLARY
Glucose-Capillary: 104 mg/dL — ABNORMAL HIGH (ref 70–99)
Glucose-Capillary: 81 mg/dL (ref 70–99)

## 2011-09-18 LAB — CBC
Hemoglobin: 8.8 g/dL — ABNORMAL LOW (ref 13.0–17.0)
MCHC: 33.5 g/dL (ref 30.0–36.0)
Platelets: 83 10*3/uL — ABNORMAL LOW (ref 150–400)
RDW: 12.7 % (ref 11.5–15.5)

## 2011-09-18 LAB — CREATININE, SERUM
Creatinine, Ser: 0.63 mg/dL (ref 0.50–1.35)
GFR calc non Af Amer: >90 mL/min (ref >90)

## 2011-09-18 LAB — BASIC METABOLIC PANEL
BUN: 32 mg/dL — ABNORMAL HIGH (ref 6–23)
Creatinine, Ser: 1.37 mg/dL — ABNORMAL HIGH (ref 0.50–1.35)
GFR calc Af Amer: 55 mL/min — ABNORMAL LOW (ref >90)
GFR calc non Af Amer: 47 mL/min — ABNORMAL LOW (ref >90)
Potassium: 3.9 mEq/L (ref 3.5–5.1)

## 2011-09-18 SURGERY — LEFT HEART CATHETERIZATION WITH CORONARY/GRAFT ANGIOGRAM
Anesthesia: LOCAL

## 2011-09-18 MED ORDER — CLOPIDOGREL BISULFATE 75 MG PO TABS
75.0000 mg | ORAL_TABLET | Freq: Every day | ORAL | Status: DC
  Administered 2011-09-19: 75 mg via ORAL
  Filled 2011-09-18 (×2): qty 1

## 2011-09-18 MED ORDER — SODIUM CHLORIDE 0.9 % IV SOLN
INTRAVENOUS | Status: AC
  Administered 2011-09-18: 150 mL/h via INTRAVENOUS

## 2011-09-18 MED ORDER — MIDAZOLAM HCL 2 MG/2ML IJ SOLN
INTRAMUSCULAR | Status: AC
  Filled 2011-09-18: qty 2

## 2011-09-18 MED ORDER — ACETAMINOPHEN 325 MG PO TABS
650.0000 mg | ORAL_TABLET | ORAL | Status: DC | PRN
  Administered 2011-09-19: 650 mg via ORAL
  Filled 2011-09-18: qty 2

## 2011-09-18 MED ORDER — DIAZEPAM 5 MG PO TABS
5.0000 mg | ORAL_TABLET | ORAL | Status: DC
  Filled 2011-09-18: qty 1

## 2011-09-18 MED ORDER — ASPIRIN EC 81 MG PO TBEC
81.0000 mg | DELAYED_RELEASE_TABLET | Freq: Every day | ORAL | Status: DC
  Administered 2011-09-19 – 2011-09-22 (×4): 81 mg via ORAL
  Filled 2011-09-18 (×4): qty 1

## 2011-09-18 MED ORDER — HEPARIN (PORCINE) IN NACL 2-0.9 UNIT/ML-% IJ SOLN
INTRAMUSCULAR | Status: AC
  Filled 2011-09-18: qty 2000

## 2011-09-18 MED ORDER — LIDOCAINE HCL (PF) 1 % IJ SOLN
INTRAMUSCULAR | Status: AC
  Filled 2011-09-18: qty 30

## 2011-09-18 MED ORDER — SODIUM CHLORIDE 0.9 % IV SOLN: 250.0000 mL | INTRAVENOUS | Status: DC | PRN

## 2011-09-18 MED ORDER — ONDANSETRON HCL 4 MG/2ML IJ SOLN: 4.0000 mg | Freq: Four times a day (QID) | INTRAMUSCULAR | Status: DC | PRN

## 2011-09-18 MED ORDER — GLIPIZIDE ER 10 MG PO TB24
10.0000 mg | ORAL_TABLET | Freq: Every day | ORAL | Status: DC
  Administered 2011-09-19 – 2011-09-22 (×3): 10 mg via ORAL
  Filled 2011-09-18 (×5): qty 1

## 2011-09-18 MED ORDER — FENTANYL CITRATE 0.05 MG/ML IJ SOLN
INTRAMUSCULAR | Status: AC
  Filled 2011-09-18: qty 2

## 2011-09-18 MED ORDER — PANTOPRAZOLE SODIUM 40 MG PO TBEC
40.0000 mg | DELAYED_RELEASE_TABLET | Freq: Every day | ORAL | Status: DC
  Administered 2011-09-18 – 2011-09-22 (×5): 40 mg via ORAL
  Filled 2011-09-18 (×3): qty 1

## 2011-09-18 MED ORDER — ASPIRIN 81 MG PO CHEW
324.0000 mg | CHEWABLE_TABLET | ORAL | Status: AC
  Administered 2011-09-18: 324 mg via ORAL
  Filled 2011-09-18: qty 4

## 2011-09-18 MED ORDER — ENOXAPARIN SODIUM 40 MG/0.4ML ~~LOC~~ SOLN
40.0000 mg | Freq: Every day | SUBCUTANEOUS | Status: DC
  Administered 2011-09-18 – 2011-09-20 (×3): 40 mg via SUBCUTANEOUS
  Filled 2011-09-18 (×4): qty 0.4

## 2011-09-18 MED ORDER — SODIUM CHLORIDE 0.9 % IJ SOLN: 3.0000 mL | Freq: Two times a day (BID) | INTRAMUSCULAR | Status: DC

## 2011-09-18 MED ORDER — NITROGLYCERIN 0.2 MG/ML ON CALL CATH LAB
INTRAVENOUS | Status: AC
  Filled 2011-09-18: qty 1

## 2011-09-18 MED ORDER — SODIUM CHLORIDE 0.9 % IJ SOLN: 3.0000 mL | INTRAMUSCULAR | Status: DC | PRN

## 2011-09-18 MED ORDER — METOPROLOL TARTRATE 25 MG PO TABS
25.0000 mg | ORAL_TABLET | Freq: Two times a day (BID) | ORAL | Status: DC
  Administered 2011-09-18 – 2011-09-22 (×8): 25 mg via ORAL
  Filled 2011-09-18 (×9): qty 1

## 2011-09-18 MED ORDER — SODIUM CHLORIDE 0.9 % IV SOLN
INTRAVENOUS | Status: DC
  Administered 2011-09-18: 08:00:00 via INTRAVENOUS

## 2011-09-18 MED ORDER — NITROGLYCERIN 0.4 MG SL SUBL
0.4000 mg | SUBLINGUAL_TABLET | SUBLINGUAL | Status: DC | PRN
  Administered 2011-09-18: 0.4 mg via SUBLINGUAL
  Filled 2011-09-18: qty 25

## 2011-09-18 MED ORDER — ISOSORBIDE DINITRATE 20 MG PO TABS
20.0000 mg | ORAL_TABLET | Freq: Three times a day (TID) | ORAL | Status: DC
  Administered 2011-09-18 – 2011-09-22 (×12): 20 mg via ORAL
  Filled 2011-09-18 (×15): qty 1

## 2011-09-18 NOTE — CV Procedure (Signed)
   Cardiac Catheterization Procedure Note  Name: Jerry Benson MRN: [id number] DOB: 08/07/1928  Procedure: Left Heart Cath, Selective Coronary Angiography, aortic arch angiography, SVG and LIMA angiography  Indication: unstable angina post CABG times 2, 1981, 2000   Procedural details: The right groin was prepped, draped, and anesthetized with 1% lidocaine. Using modified Seldinger technique, a 5 French sheath was introduced into the right femoral artery. Standard Judkins catheters were used for coronary angiography and arch aortogram.  The latter was done because we could not cannulate the left subclavian for LIMA angio. Catheter exchanges were performed over a guidewire. There were no immediate procedural complications. The patient was transferred to the post catheterization recovery area for further monitoring. PCI was deferred because of CKD and contrast considerations.  I reviewed the films with the patient's family.    Procedural Findings: Hemodynamics:  AO 134/53 (81) LV 140/15 No gradient on pullback across the aortic valve   Coronary angiography: Coronary dominance: right  Left mainstem: Heavily calcified, severe proximal disease, the 99% stenosis with timi 2 flow.  It supplies one septal with diminished flow.  Left anterior descending (LAD): total proximal occlusion  Left circumflex (LCx): total proximal occlusion  Right coronary artery (RCA): Not injected to reduce contrast.  Known total occlusion.  SVG to RCA:  Widely patent into the PDA.  The distal PDA is widely patent.  The retrograde PDA is severely diseased, and supplies a large PLA, and also a circumflex collateral.  It is not approachable from the standpoint of PCI, but is a potential source of angina.    SVG to CFX OM:  The SVG has been previously stented in the proximal body.  There is 90% mid stent restenosis.  There is a second lesion of 50-60% just distal to the stent.  The graft inserts to a smaller marginal  branch and there is 50-70% beyond the insertion.  The retrograde portions supplies a larger marginal system, with the subbranches demonstrating moderate disease.  These subbranches cannot be approached.    The LIMA to the LAD is patent.  The distal LAD is small and segmentally plaqued.  The IMA takeoff from the subclavian is open.  The subclavian proximally has 40% smooth narrowing, not significant in terms of obstruction.  The vertebral is severely diseased at its proximal takeoff, being 90% and irregular.  (he is not symptomatic).    The arch study shows an innominate and left carotid arising from a common trunk.  No high grade proximal stenoses are noted.  The left subclavian comes off at an angle, and the entry site is noted by calcification  (for future reference).  There is some proximal narrowing as noted  (see above)  No LV was done to reduce contrast load.     Final Conclusions:   1.  High grade restenosis of SVG to OM DES stent 2.  Severe native disease 3.  Patent IMA to LAD 4.  Patent SVG to PDA with severe retrograde disease to the PLA  (not approachable)   Recommendations:  1.  PCI on Monday with DES.  As ISR, likely no distal protection.      [name] 09/18/2011, 11:26 AM

## 2011-09-18 NOTE — Interval H&P Note (Signed)
History and Physical Interval Note:  09/18/2011 9:29 AM  Jerry Benson  has presented today for surgery, with the diagnosis of chest pain  The various methods of treatment have been discussed with the patient and family. After consideration of risks, benefits and other options for treatment, the patient has consented to  Procedure(s) (LRB): LEFT HEART CATHETERIZATION WITH CORONARY/GRAFT ANGIOGRAM (N/A) as a surgical intervention .  The patients' history has been reviewed, patient examined, no change in status, stable for surgery.  I have reviewed the patients' chart and labs.  Questions were answered to the patient's satisfaction.   I reviewed his history in detail.  We have increased his preop fluids.  He and his wife, and daughter, have been informed of the risks. Old studies reviewed.  Plan diagnostic cath.  Depending on the extent of PCI needed, we will then decide on immediate or deferred strategy.      [name]

## 2011-09-18 NOTE — Progress Notes (Signed)
Patient complained of chest pain 6/10 and requested a NTG SL tablet.  BP 178/90, HR 72..  Five minutes after taking the NTG patient's BP 160/88 and pt. stated he was not having any more chest pain. Baird Lyons 6:02 PM

## 2011-09-18 NOTE — H&P (View-Only) (Signed)
Jerry Benson Date of Birth: 08-Mar-1929 Medical Record #[id number]  History of Present Illness: [name] is seen back today for a 2 week check. He is seen for Dr. Patty Sermons. He has known extensive CAD and has been managed medically. He had remote MI with CABG in 1981 with redo CABG in 2009, PCI in 2010 to the SVG to the DX and to the OM sequentially. He has been having exertional angina. Blood pressure has been elevated. He does not tolerate Norvasc due to headaches. He has been switched over to Lisinopril Hct. At his last visit, his blood pressure was better but he was having more angina. We increased his Isordil and added PPI as well.   He comes in today. He still is having this burning in his chest. All exertional in origin. Relieved with rest. He is now using more sl NTG. Not able to do his walking any more. He is quite reluctant to proceed with cath but admits that he is not getting any better. He has had no medicines today. He has a little rash on both of his arms that he thinks is due to the higher dose of Isordil.   Current Outpatient Prescriptions on File Prior to Visit  Medication Sig Dispense Refill  . aspirin 81 MG tablet Take 81 mg by mouth daily.        . benazepril-hydrochlorthiazide (LOTENSIN HCT) 20-12.5 MG per tablet Take 1 tablet by mouth daily.  30 tablet  11  . CRESTOR 10 MG tablet TAKE  1/2 TABLET BY MOUTH DAILY  45 tablet  6  . glipiZIDE (GLUCOTROL XL) 10 MG 24 hr tablet TAKE 1 TABLET BY MOUTH EVERY DAY  30 tablet  6  . isosorbide dinitrate (ISORDIL) 20 MG tablet Take 1 tablet (20 mg total) by mouth 3 (three) times daily. ( 1/2 tablet )  90 tablet  11  . metFORMIN (GLUCOPHAGE) 500 MG tablet TAKE 1 TABLET BY MOUTH THREE TIMES DAILY  270 tablet  3  . metoprolol (LOPRESSOR) 50 MG tablet TAKE 1/2 TABLET BY MOUTH TWICE DAILY  30 tablet  6  . nitroGLYCERIN (NITROSTAT) 0.4 MG SL tablet Place 1 tablet (0.4 mg total) under the tongue every 5 (five) minutes as needed.  25 tablet  11    . pantoprazole (PROTONIX) 40 MG tablet Take 1 tablet (40 mg total) by mouth daily.  30 tablet  6  . PLAVIX 75 MG tablet TAKE 1 TABLET BY MOUTH DAILY  90 tablet  3    Allergies  Allergen Reactions  . Amlodipine     headache  . Lipitor (Atorvastatin Calcium)   . Lisinopril     Rash / cough. However patient is currently taking Lotensin with no problems    Past Medical History  Diagnosis Date  . Coronary artery disease     Remote MI; Prior CAB, redo CABG in 2009 with PCI to SVG to DX and OM in 2010  . Diabetes mellitus   . Hypertension   . Hyperlipidemia   . Chest pain   . GERD (gastroesophageal reflux disease)   . Status post insertion of non-drug eluting coronary artery stent 10/05/2008  . CHF (congestive heart failure)   . Heart attack 08/1979  . S/P CABG (coronary artery bypass graft) 1999    Past Surgical History  Procedure Date  . Coronary artery bypass graft 1999  . Cardiac catheterization 10/11/2008    PCI to SVG to diagonal and OM in 2010  History  Smoking status  . Never Smoker   Smokeless tobacco  . Never Used    History  Alcohol Use No    Family History  Problem Relation Age of Onset  . Stroke Daughter     09/2008  . Heart disease Father   . Heart attack Father   . Heart attack Brother   . Heart disease Mother     Review of Systems: The review of systems is per the HPI.  All other systems were reviewed and are negative.  Physical Exam: BP 168/82  Pulse 68  Ht 5\' 6"  (1.676 m)  Wt 146 lb (66.225 kg)  BMI 23.56 kg/m2 Patient is very pleasant and in no acute distress. Skin is warm and dry. Color is normal.  HEENT is unremarkable. Normocephalic/atraumatic. PERRL. Sclera are nonicteric. Neck is supple. No masses. No JVD. Lungs are clear. Cardiac exam shows a regular rate and rhythm. Abdomen is soft. Extremities are without edema. Gait and ROM are intact. No gross neurologic deficits noted.   LABORATORY DATA: PENDING   Assessment / Plan:

## 2011-09-19 DIAGNOSIS — I2510 Atherosclerotic heart disease of native coronary artery without angina pectoris: Secondary | ICD-10-CM

## 2011-09-19 DIAGNOSIS — I200 Unstable angina: Secondary | ICD-10-CM

## 2011-09-19 LAB — CBC
MCH: 29.6 pg (ref 26.0–34.0)
MCHC: 34 g/dL (ref 30.0–36.0)
Platelets: 119 10*3/uL — ABNORMAL LOW (ref 150–400)
RDW: 12.6 % (ref 11.5–15.5)

## 2011-09-19 LAB — BASIC METABOLIC PANEL
Calcium: 9.2 mg/dL (ref 8.4–10.5)
Creatinine, Ser: 0.98 mg/dL (ref 0.50–1.35)
GFR calc non Af Amer: 76 mL/min — ABNORMAL LOW (ref >90)
Sodium: 137 mEq/L (ref 135–145)

## 2011-09-19 NOTE — Progress Notes (Addendum)
Subjective:  He feels fine.  No current chest pain.  Not short of breath.  Had CP last night relieved with NTG, accompanied by relief with NTG at rest.  Now feels fine.  No hematoma or groin issues, no back pain.    Objective:  Vital Signs in the last 24 hours: Temp:  [97 F (36.1 C)-98.5 F (36.9 C)] 98.5 F (36.9 C) (03/16 0448) Pulse Rate:  [63-70] 63  (03/16 0448) Resp:  [18-19] 19  (03/16 0448) BP: (145-178)/(79-90) 145/83 mmHg (03/16 0448) SpO2:  [97 %-99 %] 99 % (03/16 0448)  Intake/Output from previous day: 03/15 0701 - 03/16 0700 In: 840 [P.O.:840] Out: 1150 [Urine:1150]   Physical Exam: General:  Thin older gentleman in no distress Head:  Normocephalic and atraumatic. Lungs: Clear to auscultation and percussion. Heart: Normal S1 and S2.  No murmur, rubs or gallops.  Pulses: Pulses normal in all 4 extremities.  Groin looks ok.  No hematoma.  No ecchymosis.  No flank bruising.  Extremities: No clubbing or cyanosis. No edema. Neurologic: Alert and oriented x 3.    Lab Results:  [facility] 09/18/11 1651  WBC 2.9*  HGB 8.8*  PLT 83*    Basename 09/18/11 1651 09/18/11 0724  NA -- 138  K -- 3.9  CL -- 99  CO2 -- 27  GLUCOSE -- 121*  BUN -- 32*  CREATININE 0.63 1.37*   No results found for this basename: TROPONINI:2,CK,MB:2 in the last 72 hours Hepatic Function Panel No results found for this basename: PROT,ALBUMIN,AST,ALT,ALKPHOS,BILITOT,BILIDIR,IBILI in the last 72 hours No results found for this basename: CHOL in the last 72 hours No results found for this basename: PROTIME in the last 72 hours  Imaging: Dg Chest 2 View  09/18/2011  *RADIOLOGY REPORT*  Clinical Data: Pre-procedure respiratory exam.  Coronary artery disease.  CHEST - 2 VIEW  Comparison: 10/10/2008  Findings: Evidence of prior CABG.  Overall heart size and pulmonary vascularity are normal and the lungs are clear.  No acute osseous abnormality.  Chronic peribronchial thickening.  IMPRESSION: No  acute abnormalities.  Chronic peribronchial thickening.  Original Report Authenticated By: Gwynn Burly, M.D.     Assessment/Plan:  1.  Unstable angina         Had additional pain last pm.  Has high grade stenosis in SVG at prior stent site, and also has some progression of native disease.  I reviewed the films with his family.  We did not proceed yesterday due to renal insufficiency identified prior to cath.  He is not a good candidate for additional redo  (2 prior CABG), so will favor restenting of the SVG with continued medical therapy.  I have reviewed this with them, and will plan to proceed Monday am unless issues with Hgb.  2.  Anemia   --  ?? Spurious.  Obtained by pharmacy and not reported to me.  Will recheck CBC and BMET at present. 3.  CKD, stage 3 -- recheck BUN/Cr with gentle hydration 4.  DM  --check accuchecks at present.   5.  HTN -  Monitor  Reviewed findings and reports with nursing staff.  Fayrene Fearing)      [name], MD, [facility], FSCAI 09/19/2011, 9:55 AM

## 2011-09-20 LAB — COMPREHENSIVE METABOLIC PANEL
ALT: 10 U/L (ref 0–53)
Albumin: 3.7 g/dL (ref 3.5–5.2)
Alkaline Phosphatase: 54 U/L (ref 39–117)
BUN: 20 mg/dL (ref 6–23)
Calcium: 9.4 mg/dL (ref 8.4–10.5)
Potassium: 3.7 mEq/L (ref 3.5–5.1)
Sodium: 138 mEq/L (ref 135–145)
Total Protein: 6.7 g/dL (ref 6.0–8.3)

## 2011-09-20 LAB — LIPID PANEL: Cholesterol: 121 mg/dL (ref 0–200)

## 2011-09-20 LAB — CBC
MCH: 29.2 pg (ref 26.0–34.0)
MCHC: 33.5 g/dL (ref 30.0–36.0)
MCV: 87 fL (ref 78.0–100.0)
Platelets: 132 10*3/uL — ABNORMAL LOW (ref 150–400)
RDW: 12.6 % (ref 11.5–15.5)

## 2011-09-20 LAB — PLATELET INHIBITION P2Y12: Platelet Function  P2Y12: 268 [PRU] (ref 194–418)

## 2011-09-20 MED ORDER — CLOPIDOGREL BISULFATE 75 MG PO TABS
75.0000 mg | ORAL_TABLET | ORAL | Status: AC
  Administered 2011-09-21: 75 mg via ORAL
  Filled 2011-09-20: qty 1

## 2011-09-20 MED ORDER — SODIUM CHLORIDE 0.9 % IV SOLN: INTRAVENOUS | Status: DC

## 2011-09-20 MED ORDER — DIAZEPAM 5 MG PO TABS
5.0000 mg | ORAL_TABLET | ORAL | Status: AC
  Administered 2011-09-20: 5 mg via ORAL
  Filled 2011-09-20: qty 1

## 2011-09-20 MED ORDER — SODIUM CHLORIDE 0.9 % IV SOLN
1.0000 mL/kg/h | INTRAVENOUS | Status: DC
  Administered 2011-09-21: 1 mL/kg/h via INTRAVENOUS

## 2011-09-20 NOTE — Progress Notes (Addendum)
Subjective:  No recurrent pain.  Feels good.  No chest pain.  Anatomy reviewed with the patient, and plans for treatment discussed.    Objective:  Vital Signs in the last 24 hours: Temp:  [98.3 F (36.8 C)-98.8 F (37.1 C)] 98.8 F (37.1 C) (03/17 0353) Pulse Rate:  [57-66] 57  (03/17 0353) Resp:  [18-19] 19  (03/17 0353) BP: (141-160)/(74-82) 141/74 mmHg (03/17 0353) SpO2:  [95 %-98 %] 97 % (03/17 0353)  Intake/Output from previous day: 03/16 0701 - 03/17 0700 In: 240 [P.O.:240] Out: -    Physical Exam: Thin, older gentleman, no distress.   Head:  Normocephalic and atraumatic. Lungs: Clear to auscultation and percussion. Heart: Normal S1 and S2.  No murmur, rubs or gallops.  Pulses: Firm femorals.   Extremities: No clubbing or cyanosis. No edema. Neurologic: Alert and oriented x 3.    Lab Results:  Basename 09/20/11 0435 09/19/11 1015  WBC 5.8 4.4  HGB 11.0* 11.0*  PLT 132* 119*    Basename 09/20/11 0435 09/19/11 1015  NA 138 137  K 3.7 4.0  CL 102 104  CO2 26 25  GLUCOSE 119* 243*  BUN 20 18  CREATININE 1.04 0.98   No results found for this basename: TROPONINI:2,CK,MB:2 in the last 72 hours Hepatic Function Panel  Basename 09/20/11 0435  PROT 6.7  ALBUMIN 3.7  AST 15  ALT 10  ALKPHOS 54  BILITOT 0.6  BILIDIR --  IBILI --    Basename 09/20/11 0435  CHOL 121   No results found for this basename: PROTIME in the last 72 hours  Imaging: No results found.    Assessment/Plan:  Patient Active Hospital Problem List: Unstable angina pectoris (09/19/2011)   Assessment: No recurrent chest pain.   Plan: PCI of SVG for in stent restenosis and chest pain CKD (chronic kidney disease) stage 3, GFR 30-59 ml/min (09/19/2011)   Assessment: Cr much improved.   Plan: gentle hydration tonight, then PCI tomorrow.  I had notified Dr. Patty Sermons about the patient.  I have reviewed the potential risks with the patient in detail today, and discussed with the  patient's family on Friday.      [name] MD, [facility], [place] 8:58 AM 09/20/2011       [name], MD, [facility], FSCAI 09/20/2011, 8:54 AM

## 2011-09-21 ENCOUNTER — Encounter (HOSPITAL_COMMUNITY): Payer: Self-pay | Admitting: Cardiology

## 2011-09-21 ENCOUNTER — Other Ambulatory Visit: Payer: Self-pay

## 2011-09-21 ENCOUNTER — Encounter (HOSPITAL_COMMUNITY)
Admission: RE | Disposition: A | Discharge status: Home / Self Care | Payer: Self-pay | Source: Ambulatory Visit | Attending: Cardiology

## 2011-09-21 DIAGNOSIS — I25810 Atherosclerosis of coronary artery bypass graft(s) without angina pectoris: Secondary | ICD-10-CM

## 2011-09-21 HISTORY — PX: PERCUTANEOUS CORONARY STENT INTERVENTION (PCI-S): SHX5485

## 2011-09-21 LAB — GLUCOSE, CAPILLARY
Glucose-Capillary: 138 mg/dL — ABNORMAL HIGH (ref 70–99)
Glucose-Capillary: 118 mg/dL — ABNORMAL HIGH (ref 70–99)

## 2011-09-21 LAB — POCT ACTIVATED CLOTTING TIME: Activated Clotting Time: 424 seconds

## 2011-09-21 SURGERY — PERCUTANEOUS CORONARY STENT INTERVENTION (PCI-S)
Anesthesia: LOCAL

## 2011-09-21 MED ORDER — MIDAZOLAM HCL 2 MG/2ML IJ SOLN
INTRAMUSCULAR | Status: AC
  Filled 2011-09-21: qty 2

## 2011-09-21 MED ORDER — CLOPIDOGREL BISULFATE 75 MG PO TABS
75.0000 mg | ORAL_TABLET | Freq: Every day | ORAL | Status: DC
  Administered 2011-09-22: 75 mg via ORAL

## 2011-09-21 MED ORDER — INSULIN ASPART 100 UNIT/ML ~~LOC~~ SOLN
0.0000 [IU] | Freq: Every day | SUBCUTANEOUS | Status: DC
  Administered 2011-09-21: 2 [IU] via SUBCUTANEOUS

## 2011-09-21 MED ORDER — FENTANYL CITRATE 0.05 MG/ML IJ SOLN
INTRAMUSCULAR | Status: AC
  Filled 2011-09-21: qty 2

## 2011-09-21 MED ORDER — INSULIN ASPART 100 UNIT/ML ~~LOC~~ SOLN
0.0000 [IU] | Freq: Three times a day (TID) | SUBCUTANEOUS | Status: DC
  Administered 2011-09-22 (×2): 2 [IU] via SUBCUTANEOUS

## 2011-09-21 MED ORDER — CLOPIDOGREL BISULFATE 75 MG PO TABS: 75.0000 mg | ORAL_TABLET | Freq: Every day | ORAL | Status: DC

## 2011-09-21 MED ORDER — SODIUM CHLORIDE 0.9 % IV SOLN: INTRAVENOUS | Status: DC

## 2011-09-21 MED ORDER — HEPARIN (PORCINE) IN NACL 2-0.9 UNIT/ML-% IJ SOLN
INTRAMUSCULAR | Status: AC
  Filled 2011-09-21: qty 2000

## 2011-09-21 MED ORDER — LIDOCAINE HCL (PF) 1 % IJ SOLN
INTRAMUSCULAR | Status: AC
  Filled 2011-09-21: qty 30

## 2011-09-21 MED ORDER — INSULIN ASPART 100 UNIT/ML ~~LOC~~ SOLN: 0.0000 [IU] | Freq: Every day | SUBCUTANEOUS | Status: DC

## 2011-09-21 MED ORDER — NITROGLYCERIN 0.2 MG/ML ON CALL CATH LAB
INTRAVENOUS | Status: AC
  Filled 2011-09-21: qty 1

## 2011-09-21 MED ORDER — SODIUM CHLORIDE 0.9 % IV SOLN
INTRAVENOUS | Status: AC
  Administered 2011-09-21: 12:00:00 via INTRAVENOUS

## 2011-09-21 MED ORDER — BIVALIRUDIN 250 MG IV SOLR
INTRAVENOUS | Status: AC
  Filled 2011-09-21: qty 250

## 2011-09-21 NOTE — CV Procedure (Signed)
   CARDIAC CATH NOTE  Name: Jerry Benson MRN: [id number] DOB: March 08, 1929  Procedure: PTCA and stenting of the SVG to the OM  Indication: unstable angina and instent restenosis  Procedural Details: The right groin was prepped, draped, and anesthetized with 1% lidocaine. Using the modified Seldinger technique, a 6 Fr sheath was introduced into the right femoral artery.  Weight-based bivalirudin was given for anticoagulation. Once a therapeutic ACT was achieved, a 6 French left bypass with sidehole guide catheter was inserted.  A BMW coronary guidewire was used to cross the lesion.  The lesion was predilated with a 2.25 mm balloon with high pressure.  The lesion was then stented with a 2.75  Resolute 22mm  Stent to 16 atm.  Because of his tenuous status, we elected not to postdilate.  As the lesion was an ISR, no distal protection was placed.  .  Following PCI, there was 10% residual stenosis and TIMI-3 flow. Final angiography confirmed an excellent result. The patient tolerated the procedure well. There were no immediate procedural complications. The patient was transferred to the post catheterization recovery area for further monitoring.  I reviewed the films with his wife.  There was distal staining, but this was noted on the prior cath, and related to a distal arm of the graft that was thrombosed.  There were no complications.    Lesion Data: Vessel: SVG to OM Percent stenosis (pre): 90% TIMI-flow (pre):  3 Stent:  2.75 mm Resolute DES Percent stenosis (post): 10% TIMI-flow (post): 3  Conclusions:  1.  Successful PCI of SVG in stent restenosis 2.  Progressive disease in other territories   Recommendations:  1.  Continued close observation.   [name] 09/21/2011, 9:00 AM

## 2011-09-22 ENCOUNTER — Other Ambulatory Visit: Payer: Self-pay

## 2011-09-22 ENCOUNTER — Encounter (HOSPITAL_COMMUNITY): Payer: Self-pay | Admitting: Nurse Practitioner

## 2011-09-22 DIAGNOSIS — I200 Unstable angina: Secondary | ICD-10-CM

## 2011-09-22 LAB — BASIC METABOLIC PANEL
BUN: 17 mg/dL (ref 6–23)
CO2: 28 mEq/L (ref 19–32)
Chloride: 102 mEq/L (ref 96–112)
GFR calc non Af Amer: 76 mL/min — ABNORMAL LOW (ref >90)
Glucose, Bld: 107 mg/dL — ABNORMAL HIGH (ref 70–99)
Potassium: 4 mEq/L (ref 3.5–5.1)
Sodium: 141 mEq/L (ref 135–145)

## 2011-09-22 LAB — CBC
HCT: 35.8 % — ABNORMAL LOW (ref 39.0–52.0)
Hemoglobin: 12 g/dL — ABNORMAL LOW (ref 13.0–17.0)
RBC: 4.07 MIL/uL — ABNORMAL LOW (ref 4.22–5.81)
WBC: 6.3 10*3/uL (ref 4.0–10.5)

## 2011-09-22 LAB — GLUCOSE, CAPILLARY
Glucose-Capillary: 122 mg/dL — ABNORMAL HIGH (ref 70–99)
Glucose-Capillary: 141 mg/dL — ABNORMAL HIGH (ref 70–99)

## 2011-09-22 MED ORDER — BENAZEPRIL HCL 20 MG PO TABS
20.0000 mg | ORAL_TABLET | Freq: Every day | ORAL | Status: DC
  Administered 2011-09-22: 20 mg via ORAL
  Filled 2011-09-22: qty 1

## 2011-09-22 MED ORDER — METFORMIN HCL 500 MG PO TABS: 500.0000 mg | ORAL_TABLET | Freq: Three times a day (TID) | ORAL | Status: DC

## 2011-09-22 MED ORDER — BENAZEPRIL HCL 20 MG PO TABS: 20.0000 mg | ORAL_TABLET | Freq: Every day | ORAL | Status: DC

## 2011-09-22 MED FILL — Dextrose Inj 5%: INTRAVENOUS | Qty: 50 | Status: AC

## 2011-09-22 NOTE — Discharge Summary (Signed)
Patient ID: Jerry Benson,  MRN: [id number], DOB/AGE: 1929-01-28 78 y.o.  Admit date: 09/18/2011 Discharge date: 09/22/2011  Primary Cardiologist: Wylene Simmer, MD  Discharge Diagnoses Principal Problem:  *Unstable angina pectoris Active Problems:  Diabetes mellitus  Hypercholesterolemia  Benign hypertensive heart disease without heart failure  CAD (coronary artery disease)  CKD (chronic kidney disease) stage 3, GFR 30-59 ml/min  Allergies Allergies  Allergen Reactions  . Amlodipine     headache  . Lipitor (Atorvastatin Calcium)   . Lisinopril     Rash / cough. However patient is currently taking Lotensin with no problems    Procedures  Cardiac Catheterization 09/18/2011  Hemodynamics:  AO 134/53 (81) LV 140/15 No gradient on pullback across the aortic valve  Left mainstem: Heavily calcified, severe proximal disease, the 99% stenosis with timi 2 flow.  It supplies one septal with diminished flow. Left anterior descending (LAD): total proximal occlusion Left circumflex (LCx): total proximal occlusion Right coronary artery (RCA): Not injected to reduce contrast.  Known total occlusion. SVG to RCA:  Widely patent into the PDA.  The distal PDA is widely patent.  The retrograde PDA is severely diseased, and supplies a large PLA, and also a circumflex collateral.  It is not approachable from the standpoint of PCI, but is a potential source of angina.   SVG to CFX OM:  The SVG has been previously stented in the proximal body.  There is 90% mid stent restenosis.  There is a second lesion of 50-60% just distal to the stent.  The graft inserts to a smaller marginal branch and there is 50-70% beyond the insertion.  The retrograde portions supplies a larger marginal system, with the subbranches demonstrating moderate disease.  These subbranches cannot be approached.   The LIMA to the LAD is patent.  The distal LAD is small and segmentally plaqued.  The IMA takeoff from the subclavian is  open.  The subclavian proximally has 40% smooth narrowing, not significant in terms of obstruction.  The vertebral is severely diseased at its proximal takeoff, being 90% and irregular.  (he is not symptomatic).    The arch study shows an innominate and left carotid arising from a common trunk.  No high grade proximal stenoses are noted.  The left subclavian comes off at an angle, and the entry site is noted by calcification  (for future reference).  There is some proximal narrowing as noted  (see above)  No LV was done to reduce contrast load.   Final Conclusions:   1.  High grade restenosis of SVG to OM DES stent 2.  Severe native disease 3.  Patent IMA to LAD 4.  Patent SVG to PDA with severe retrograde disease to the PLA  (not approachable) _____________________ Percutaneous Coronary Intervention 09/21/2011  VG--> OM:  Successfully stented with a 2.75 x 22mm Resolute DES ___________________________  History of Present Illness  78 y/o male with the above problem list.  He was recently seen in clinic with complaint of exertional chest discomfort.  He was subsequently set up for outpatient cardiac catheterization.  Hospital Course  Pt presented to the [facility] cath lab on 3/15.  He underwent diagnostic cath revealing multivessel dzs with high grade in-stent restenosis within the SVG->OM.  The LIMA -> LAD & SVG->PDA were patent.  It was felt that the SVG->OM was the likely culprit for the patients symptoms.  Because of baseline renal insufficiency, decision was made to hydrate the patient over the weekend and plan elective PCI  on Monday 3/18.  He did have 1 episode of chest pain over the weekend which promptly resolved with SL NTG.  Otherwise, he did well and his creatinine remained stable.  He was taken back to the cath lab on 3/18 and underwent successful PCI and DES placement within the SVG->OM.  He tolerated this procedure well and post-procedure has been ambulating without recurrent symptoms  or limitations.    Of note, his Lotensin-HCTZ was held during this admission (in the setting of renal insufficiency & contrast load from cath).  At discharge we will resume his lotensin only, discontinuing the diuretic.  He is being discharged home today in good condition.  Discharge Vitals Blood pressure 145/70, pulse 57, temperature 98 F (36.7 C), temperature source Oral, resp. rate 18, height 5\' 6"  (1.676 m), weight 143 lb 4.8 oz (65 kg), SpO2 98.00%.  Filed Weights   09/18/11 0723 09/22/11 0036  Weight: 146 lb (66.225 kg) 143 lb 4.8 oz (65 kg)    Labs  CBC  Basename 09/22/11 0350 09/20/11 0435  WBC 6.3 5.8  NEUTROABS -- --  HGB 12.0* 11.0*  HCT 35.8* 32.8*  MCV 88.0 87.0  PLT 158 132*   Basic Metabolic Panel  Basename 09/22/11 0350 09/20/11 0435  NA 141 138  K 4.0 3.7  CL 102 102  CO2 28 26  GLUCOSE 107* 119*  BUN 17 20  CREATININE 0.96 1.04  CALCIUM 9.8 9.4  MG -- --  PHOS -- --   Liver Function Tests  [facility] 09/20/11 0435  AST 15  ALT 10  ALKPHOS 54  BILITOT 0.6  PROT 6.7  ALBUMIN 3.7   Fasting Lipid Panel  Basename 09/20/11 0435  CHOL 121  HDL 37*  LDLCALC 58  TRIG 784  CHOLHDL 3.3  LDLDIRECT --   Disposition  Pt is being discharged home today in good condition.  Follow-up Plans & Appointments  Follow-up Information    Follow up with Norma Fredrickson, NP on 09/30/2011. (10:00 AM)    Contact information:   [address] [place] [address] [phone]          Discharge Medications  Medication List  As of 09/22/2011  5:06 PM   STOP taking these medications         benazepril-hydrochlorthiazide 20-12.5 MG per tablet         TAKE these medications         aspirin 81 MG tablet   Take 81 mg by mouth daily.      benazepril 20 MG tablet   Commonly known as: LOTENSIN   Take 1 tablet (20 mg total) by mouth daily.      clopidogrel 75 MG tablet   Commonly known as: PLAVIX   Take 75 mg by mouth daily.        glipiZIDE 10 MG 24 hr tablet   Commonly known as: GLUCOTROL XL   Take 10 mg by mouth daily.      isosorbide dinitrate 20 MG tablet   Commonly known as: ISORDIL   Take 20 mg by mouth 3 (three) times daily.      metFORMIN 500 MG tablet   Commonly known as: GLUCOPHAGE   Take 1 tablet (500 mg total) by mouth 3 (three) times daily with meals.      metoprolol 50 MG tablet   Commonly known as: LOPRESSOR   Take 25 mg by mouth 2 (two) times daily.      nitroGLYCERIN 0.4  MG SL tablet   Commonly known as: NITROSTAT   Place 0.4 mg under the tongue every 5 (five) minutes as needed. For chest pain      pantoprazole 40 MG tablet   Commonly known as: PROTONIX   Take 1 tablet (40 mg total) by mouth daily.      rosuvastatin 10 MG tablet   Commonly known as: CRESTOR   Take 5 mg by mouth daily.           Outstanding Labs/Studies  F/u BMET @ clinic f/u.  Duration of Discharge Encounter   Greater than 30 minutes including physician time.  Signed, Nicolasa Ducking NP 09/22/2011, 5:06 PM

## 2011-09-22 NOTE — Progress Notes (Addendum)
Subjective:  Doing pretty well.  Mild chest discomfort  (which I expect based on anatomy)  Objective:  Vital Signs in the last 24 hours: Temp:  [97.3 F (36.3 C)-98.2 F (36.8 C)] 98.2 F (36.8 C) (03/19 0533) Pulse Rate:  [57-68] 60  (03/19 0533) Resp:  [15-20] 20  (03/19 0533) BP: (110-168)/(63-97) 120/79 mmHg (03/19 0533) SpO2:  [94 %-97 %] 96 % (03/19 0533) Weight:  [143 lb 4.8 oz (65 kg)] 143 lb 4.8 oz (65 kg) (03/19 0036)  Intake/Output from previous day: 03/18 0701 - 03/19 0700 In: 120 [P.O.:120] Out: 326 [Urine:325; Stool:1]   Physical Exam: General: thin older gentleman in no distress Head:  Normocephalic and atraumatic. Lungs: Clear to auscultation and percussion. Heart: Normal S1 and S2.  Soft SEM.   Pulses: Pulses normal in all 4 extremities. Extremities: No clubbing or cyanosis. No edema. Neurologic: Alert and oriented x 3.    Lab Results:  Basename 09/22/11 0350 09/20/11 0435  WBC 6.3 5.8  HGB 12.0* 11.0*  PLT 158 132*    Basename 09/22/11 0350 09/20/11 0435  NA 141 138  K 4.0 3.7  CL 102 102  CO2 28 26  GLUCOSE 107* 119*  BUN 17 20  CREATININE 0.96 1.04   No results found for this basename: TROPONINI:2,CK,MB:2 in the last 72 hours Hepatic Function Panel  Basename 09/20/11 0435  PROT 6.7  ALBUMIN 3.7  AST 15  ALT 10  ALKPHOS 54  BILITOT 0.6  BILIDIR --  IBILI --    Basename 09/20/11 0435  CHOL 121   No results found for this basename: PROTIME in the last 72 hours  Imaging: No results found.  EKG:  Short PR interval.  Lateral T inversion progressed from pre study.      Assessment/Plan:  Patient Active Hospital Problem List: Unstable angina pectoris (09/19/2011)   Assessment: SP PCI of SVG to OM.  There is distal disease with occlusion of a second limb to the graft, and retrograde disease in both this graft and the RCA graft.   Plan: This should improve his level of angina, but will not eliminate it.  Will ambulate today to see  how he does.  I will reassess later on.  ECG shows short PR interval.   CKD (chronic kidney disease) stage 3, GFR 30-59 ml/min (09/19/2011)   Assessment: Cr stable post cath.     Plan: encourage hydration.  HTN   Resume benazepril--hold HCTZ portion of this medication as BUN/Cr were up.         [name], MD, [facility], FSCAI 09/22/2011, 8:21 AM

## 2011-09-22 NOTE — Progress Notes (Signed)
UR Completed. Simmons, Kelin Borum F [phone]  

## 2011-09-22 NOTE — Progress Notes (Signed)
CARDIAC REHAB PHASE I   PRE:  Rate/Rhythm: 62 SR    BP: sitting 170/75    SaO2:   MODE:  Ambulation: 440 ft   POST:  Rate/Rhythm: 80    BP: sitting 179/81     SaO2:   Steady walk however had "burning" after walk, he sts its 6/10 in recliner. BP elevated before and after walk. Applied 2L O2. Burning slowly resolved. Reviewed ed. Encouraged walking close to house so he can sit and rest if angina. No CRPII per MD. [id number]  Harriet Masson CES, ACSM

## 2011-09-22 NOTE — Discharge Instructions (Signed)
***PLEASE REMEMBER TO BRING ALL OF YOUR MEDICATIONS TO EACH OF YOUR FOLLOW-UP OFFICE VISITS.  Groin Site Care Refer to this sheet in the next few weeks. These instructions provide you with information on caring for yourself after your procedure. Your caregiver may also give you more specific instructions. Your treatment has been planned according to current medical practices, but problems sometimes occur. Call your caregiver if you have any problems or questions after your procedure. HOME CARE INSTRUCTIONS  You may shower 24 hours after the procedure. Remove the bandage (dressing) and gently wash the site with plain soap and water. Gently pat the site dry.   Do not apply powder or lotion to the site.   Do not sit in a bathtub, swimming pool, or whirlpool for 5 to 7 days.   No bending, squatting, or lifting anything over 10 pounds (4.5 kg) as directed by your caregiver.   Inspect the site at least twice daily.  What to expect:  Any bruising will usually fade within 1 to 2 weeks.   Blood that collects in the tissue (hematoma) may be painful to the touch. It should usually decrease in size and tenderness within 1 to 2 weeks.  SEEK IMMEDIATE MEDICAL CARE IF:  You have unusual pain at the groin site or down the affected leg.   You have redness, warmth, swelling, or pain at the groin site.   You have drainage (other than a small amount of blood on the dressing).   You have chills.   You have a fever or persistent symptoms for more than 72 hours.   You have a fever and your symptoms suddenly get worse.   Your leg becomes pale, cool, tingly, or numb.  You have heavy bleeding from the site. Hold pressure on the site. . Acute Coronary Syndrome Acute coronary syndrome (ACS) is an urgent problem in which the blood and oxygen supply to the heart is critically deficient. ACS requires hospitalization because one or more coronary arteries may be blocked. ACS represents a range of conditions  including:  Previous angina that is now unstable, lasts longer, happens at rest, or is more intense.   A heart attack, with heart muscle cell injury and death.  There are three vital coronary arteries that supply the heart muscle with blood and oxygen so that it can pump blood effectively. If blockages to these arteries develop, blood flow to the heart muscle is reduced. If the heart does not get enough blood, angina may occur as the first warning sign. SYMPTOMS   The most common signs of angina include:   Tightness or squeezing in the chest.   Feeling of heaviness on the chest.   Discomfort in the arms, neck, or jaw.   Shortness of breath and nausea.   Cold, wet skin.   Angina is usually brought on by physical effort or excitement which increase the oxygen needs of the heart. These states increase the blood flow needs of the heart beyond what can be delivered.  TREATMENT   Medicines to help discomfort may include nitroglycerin (nitro) in the form of tablets or a spray for rapid relief, or longer-acting forms such as cream, patches, or capsules. (Be aware that there are many side effects and possible interactions with other drugs).   Other medicines may be used to help the heart pump better.   Procedures to open blocked arteries including angioplasty or stent placement to keep the arteries open.   Open heart surgery may be needed  when there are many blockages or they are in critical locations that are best treated with surgery.  HOME CARE INSTRUCTIONS   Avoid smoking.   Take one baby or adult aspirin daily, if your caregiver advises. This helps reduce the risk of a heart attack.   It is very important that you follow the angina treatment prescribed by your caregiver. Make arrangements for proper follow-up care.   Eat a heart healthy diet with salt and fat restrictions as advised.   Regular exercise is good for you as long as it does not cause discomfort. Do not begin any new  type of exercise until you check with your caregiver.   If you are overweight, you should lose weight.   Try to maintain normal blood lipid levels.   Keep your blood pressure under control as recommended by your caregiver.   You should tell your caregiver right away about any increase in the severity or frequency of your chest discomfort or angina attacks. When you have angina, you should stop what you are doing and sit down. This may bring relief in 3 to 5 minutes. If your caregiver has prescribed nitro, take it as directed.   If your caregiver has given you a follow-up appointment, it is very important to keep that appointment. Not keeping the appointment could result in a chronic or permanent injury, pain, and disability. If there is any problem keeping the appointment, you must call back to this facility for assistance.  SEEK IMMEDIATE MEDICAL CARE IF:   You develop nausea, vomiting, or shortness of breath.   You feel faint, lightheaded, or pass out.   Your chest discomfort gets worse.   You are sweating or experience sudden profound fatigue.   You do not get relief of your chest pain after 3 doses of nitro.   Your discomfort lasts longer than 15 minutes.  MAKE SURE YOU:   Understand these instructions.   Will watch your condition.   Will get help right away if you are not doing well or get worse.  Document Released: 06/22/2005 Document Revised: 06/11/2011 Document Reviewed: 01/24/2008 [facility] Patient Information 2012 [place], [place].Angina Angina is chest discomfort caused by lack of oxygen to the heart muscle. It is a warning sign that there is a blood flow problem to your heart. Angina is referred to as either stable or unstable. Stable angina often happens with the same kind of activity, lasts a few minutes, and feels the same each time. Unstable angina has no pattern, no warning, lasts longer, and is more serious. Unstable angina might predict a heart attack. HOME CARE    Understand how to take your medicine and what side effects to expect.   Do not stop the medicines.   Do not change how much you take (dosage) on your own.   Write down any side effects. Tell your doctor what they are.   Mild exercise may help. Start exercising only as told by your doctor.   You can still have a sexual relationship if it does not cause angina. Tell your doctor if it does.   Stop smoking. Do not use gum or patches that help people quit smoking until you check with your doctor.   Lose weight if you are overweight. Eat a heart-healthy diet that is low in fat and salt.   Keep all follow-up visits with your doctor. This is important!  GET HELP RIGHT AWAY IF:   Your angina seems to happen more often or lasts longer.  You are having side effects from your medicine.   Your chest pain spreads to the arms, back, neck, or jaw (especially if the pain is crushing or pressure-like).   You are sweating, feel sick to your stomach (nauseous), or have shortness of breath.   You have an attack that does not get better after rest or taking medicine.   You wake from sleep with chest pain.   You feel dizzy, faint, or feel very tired (fatigued).   You have chest pain that is different from your usual angina.  Any of these problems may be a sign of a serious problem that is an emergency. Do not wait to see if the problems will go away. Get medical help right away. Call your local emergency services (911 in [place] Do not drive yourself to the hospital. MAKE SURE YOU:   Understand these instructions.   Will watch your condition.   Will get help right away if you are not doing well or get worse.  Document Released: 12/09/2007 Document Revised: 06/11/2011 Document Reviewed: 12/09/2007 [facility] Patient Information 2012 [place], [place].Patient will benefit from ongoing skilled PT services in {setting:3041680} to continue to advance safe functional mobility, address ongoing  impairments in ***, and minimize fall risk.Patient will benefit from ongoing skilled PT services in {setting:3041680} to continue to advance safe functional mobility, address ongoing impairments in ***, and minimize fall risk.

## 2011-09-30 ENCOUNTER — Encounter: Payer: Self-pay | Admitting: Nurse Practitioner

## 2011-09-30 ENCOUNTER — Ambulatory Visit (INDEPENDENT_AMBULATORY_CARE_PROVIDER_SITE_OTHER): Payer: Medicare Other | Admitting: Nurse Practitioner

## 2011-09-30 VITALS — BP 128/80 | HR 66 | Ht 66.0 in | Wt 146.0 lb

## 2011-09-30 DIAGNOSIS — I119 Hypertensive heart disease without heart failure: Secondary | ICD-10-CM

## 2011-09-30 DIAGNOSIS — R1013 Epigastric pain: Secondary | ICD-10-CM

## 2011-09-30 DIAGNOSIS — K3189 Other diseases of stomach and duodenum: Secondary | ICD-10-CM

## 2011-09-30 DIAGNOSIS — I2510 Atherosclerotic heart disease of native coronary artery without angina pectoris: Secondary | ICD-10-CM

## 2011-09-30 NOTE — Assessment & Plan Note (Signed)
Patient presents after having staged cath with PCI. Now with additional stent in the SVG to the OM. The SVG to the PD is patent but there is severe retrograde disease to the PL that is not approachable. He does appear to be better clinically but not totally painfree. He is happy with how he currently feels. Will need to recheck his lab today. Will plan on seeing him back in 6 weeks with Dr. Patty Sermons. I have encouraged him to use his NTG prn. Patient is agreeable to this plan and will call if any problems develop in the interim.

## 2011-09-30 NOTE — Progress Notes (Signed)
Jerry Benson Date of Birth: [birth year] Medical Record #[id number]  History of Present Illness: Jerry Benson is seen back today for a post hospital visit. He is seen for Dr. Patty Sermons. He has known extensive CAD and had been managed medically. He had remote MI with CABG in 1981 with redo surgery in 2009, PCI in 2010 to the SVG to the DX & OM sequentially. He has had progressive angina and had been failing on outpatient therapy. He has had recent staged cardiac cath with successful stenting of the SVG to the OM with a DES Resolute stent. He remains on his Plavix. He was noted to have severe native disease, patent LIMA to the LAD, patent SVG to the PDA with severe retrograde disease to the PL that is not approachable. He had normal renal function at discharge.   He comes in today. He is feeling better. He says he is having less burning in his chest. No more symptoms at rest. Still will have some with exertion. Still uses a little NTG. He says he is improved since the new stent has been placed. His medicines have been changed. He has had a rash on his arms that he attributes to his Protonix. He stopped the Protonix for a while but restarted it. He says it does help him with GERD. His skin is quite sensitive per his report. The rash does not itch. Seems to come and go. He doesn't seem too bothered by it.   Current Outpatient Prescriptions on File Prior to Visit  Medication Sig Dispense Refill  . aspirin 81 MG tablet Take 81 mg by mouth daily.        . benazepril (LOTENSIN) 20 MG tablet Take 1 tablet (20 mg total) by mouth daily.  30 tablet  6  . clopidogrel (PLAVIX) 75 MG tablet Take 75 mg by mouth daily.      Marland Kitchen glipiZIDE (GLUCOTROL XL) 10 MG 24 hr tablet Take 10 mg by mouth daily.      . isosorbide dinitrate (ISORDIL) 20 MG tablet Take 20 mg by mouth 3 (three) times daily.      . metFORMIN (GLUCOPHAGE) 500 MG tablet Take 1 tablet (500 mg total) by mouth 3 (three) times daily with meals.      . metoprolol  (LOPRESSOR) 50 MG tablet Take 25 mg by mouth 2 (two) times daily.      . nitroGLYCERIN (NITROSTAT) 0.4 MG SL tablet Place 0.4 mg under the tongue every 5 (five) minutes as needed. For chest pain      . pantoprazole (PROTONIX) 40 MG tablet Take 1 tablet (40 mg total) by mouth daily.  30 tablet  6  . rosuvastatin (CRESTOR) 10 MG tablet Take 5 mg by mouth daily.        Allergies  Allergen Reactions  . Amlodipine     headache  . Lipitor (Atorvastatin Calcium)   . Lisinopril     Rash / cough. However patient is currently taking Lotensin with no problems    Past Medical History  Diagnosis Date  . Coronary artery disease     a. Remote MI 1981;  b. Prior CABG per Dr. Hendricks Milo in 1981;  c. redo CABG in 2009;  d. PCI to SVG to DX and OM in 2010;  e. PCI/DES VG-> OM (ISR) w/ 2.75x66mm Resolute DES Mar. 2013  . Diabetes mellitus   . Hypertension   . Hyperlipidemia   . Chest pain   . GERD (gastroesophageal reflux disease)   .  Status post insertion of non-drug eluting coronary artery stent 10/05/2008  . CHF (congestive heart failure)   . CKD (chronic kidney disease) stage 3, GFR 30-59 ml/min     Past Surgical History  Procedure Date  . Coronary artery bypass graft     original surgery in in 1981 with redo surgery in 2009. His original surgery was by Dr. Hendricks Milo and he had a SVG to the RCA, SVG to DX/OM, and SVG to DX/LAD; Redo surgery per Dr. Laneta Simmers in 2009 with LIMA to LAD and SVG to PD  . Cardiac catheterization 10/11/2008    PCI to SVG to diagonal and OM in 2010    History  Smoking status  . Never Smoker   Smokeless tobacco  . Never Used    History  Alcohol Use No    Family History  Problem Relation Age of Onset  . Stroke Daughter     09/2008  . Heart disease Father   . Heart attack Father   . Heart attack Brother   . Heart disease Mother     Review of Systems: The review of systems is per the HPI.  All other systems were reviewed and are negative.  Physical  Exam: BP 128/80  Pulse 66  Ht 5\' 6"  (1.676 m)  Wt 146 lb (66.225 kg)  BMI 23.56 kg/m2 Patient is very pleasant and in no acute distress. Skin is warm and dry. He does have just a few small red bumps on his lower arms. Color is normal.  HEENT is unremarkable. Normocephalic/atraumatic. PERRL. Sclera are nonicteric. Neck is supple. No masses. No JVD. Lungs are clear. Cardiac exam shows a regular rate and rhythm. Abdomen is soft. Extremities are without edema. Gait and ROM are intact. No gross neurologic deficits noted.   LABORATORY DATA:   Chemistry      Component Value Date/Time   NA 141 09/22/2011 0350   K 4.0 09/22/2011 0350   CL 102 09/22/2011 0350   CO2 28 09/22/2011 0350   BUN 17 09/22/2011 0350   CREATININE 0.96 09/22/2011 0350      Component Value Date/Time   CALCIUM 9.8 09/22/2011 0350   ALKPHOS 54 09/20/2011 0435   AST 15 09/20/2011 0435   ALT 10 09/20/2011 0435   BILITOT 0.6 09/20/2011 0435       Assessment / Plan:

## 2011-09-30 NOTE — Assessment & Plan Note (Signed)
He wants to stay on the Protonix. He will let us know if his rash worsens in order to change his regimen.

## 2011-09-30 NOTE — Assessment & Plan Note (Signed)
His HCTZ was discontinued in the hospital. Now just on plain ACE. Blood pressure is ok. Will continue with his current medicines.

## 2011-09-30 NOTE — Patient Instructions (Signed)
Stay on your current medicines.  We will see you back in 6 weeks.  Use your NTG under your tongue for recurrent chest pain. May take one tablet every 5 minutes. If you are still having discomfort after 3 tablets in 15 minutes, call 911.  Call the [facility] office at [phone] if you have any questions, problems or concerns.

## 2011-10-01 ENCOUNTER — Telehealth: Payer: Self-pay | Admitting: *Deleted

## 2011-10-01 NOTE — Telephone Encounter (Signed)
Advised, coming in 4/2

## 2011-10-01 NOTE — Telephone Encounter (Signed)
Message copied by Burnell Blanks on Thu Oct 01, 2011  6:10 PM ------      Message from: Rosalio Macadamia      Created: Wed Sep 30, 2011 10:50 AM       Juliette Alcide,            Can you call Linell and get him to come in for a BMET next week. It got missed at his visit with me today.            Thanks            Lawson Fiscal

## 2011-10-06 ENCOUNTER — Other Ambulatory Visit (INDEPENDENT_AMBULATORY_CARE_PROVIDER_SITE_OTHER): Payer: Medicare Other

## 2011-10-06 DIAGNOSIS — E119 Type 2 diabetes mellitus without complications: Secondary | ICD-10-CM

## 2011-10-06 LAB — BASIC METABOLIC PANEL
Calcium: 9.5 mg/dL (ref 8.4–10.5)
Chloride: 103 mEq/L (ref 96–112)
Creatinine, Ser: 1 mg/dL (ref 0.4–1.5)
GFR: 73.7 mL/min (ref >60.00)

## 2011-10-06 NOTE — Progress Notes (Signed)
Quick Note:  Please report to patient. The recent labs are stable. Continue same medication and careful diet. BS higher so watch sweets etc ______

## 2011-10-07 ENCOUNTER — Telehealth: Payer: Self-pay | Admitting: *Deleted

## 2011-10-07 NOTE — Telephone Encounter (Signed)
Advised patient of labs 

## 2011-10-07 NOTE — Telephone Encounter (Signed)
Message copied by Burnell Blanks on Wed Oct 07, 2011  4:02 PM ------      Message from: Cassell Clement      Created: Tue Oct 06, 2011  9:14 PM       Please report to patient.  The recent labs are stable. Continue same medication and careful diet. BS higher so watch sweets etc

## 2011-10-14 NOTE — Discharge Summary (Signed)
Seen and agree.  Patient will follow up with Dr. Patty Sermons in the office.  See my notes.

## 2011-11-12 ENCOUNTER — Other Ambulatory Visit: Payer: Self-pay | Admitting: Cardiology

## 2011-11-12 NOTE — Telephone Encounter (Signed)
Refilled glipizide 

## 2011-11-16 ENCOUNTER — Encounter: Payer: Self-pay | Admitting: Cardiology

## 2011-11-16 ENCOUNTER — Ambulatory Visit (INDEPENDENT_AMBULATORY_CARE_PROVIDER_SITE_OTHER): Payer: Medicare Other | Admitting: Cardiology

## 2011-11-16 DIAGNOSIS — K3189 Other diseases of stomach and duodenum: Secondary | ICD-10-CM

## 2011-11-16 DIAGNOSIS — R5383 Other fatigue: Secondary | ICD-10-CM | POA: Insufficient documentation

## 2011-11-16 DIAGNOSIS — K219 Gastro-esophageal reflux disease without esophagitis: Secondary | ICD-10-CM

## 2011-11-16 DIAGNOSIS — R1013 Epigastric pain: Secondary | ICD-10-CM

## 2011-11-16 DIAGNOSIS — I2510 Atherosclerotic heart disease of native coronary artery without angina pectoris: Secondary | ICD-10-CM

## 2011-11-16 DIAGNOSIS — E119 Type 2 diabetes mellitus without complications: Secondary | ICD-10-CM

## 2011-11-16 DIAGNOSIS — R5381 Other malaise: Secondary | ICD-10-CM

## 2011-11-16 LAB — BASIC METABOLIC PANEL
CO2: 25 mEq/L (ref 19–32)
GFR: 69.02 mL/min (ref >60.00)
Glucose, Bld: 143 mg/dL — ABNORMAL HIGH (ref 70–99)
Potassium: 4.6 mEq/L (ref 3.5–5.1)
Sodium: 138 mEq/L (ref 135–145)

## 2011-11-16 LAB — CBC WITH DIFFERENTIAL/PLATELET
Basophils Relative: 0.2 % (ref 0.0–3.0)
Eosinophils Relative: 4.7 % (ref 0.0–5.0)
Lymphocytes Relative: 19.2 % (ref 12.0–46.0)
Monocytes Relative: 7 % (ref 3.0–12.0)
Neutrophils Relative %: 68.9 % (ref 43.0–77.0)
RBC: 4.3 Mil/uL (ref 4.22–5.81)
WBC: 5.7 10*3/uL (ref 4.5–10.5)

## 2011-11-16 LAB — TSH: TSH: 1.79 u[IU]/mL (ref 0.35–5.50)

## 2011-11-16 MED ORDER — FAMOTIDINE 40 MG PO TABS: 40.0000 mg | ORAL_TABLET | Freq: Every day | ORAL | Status: DC

## 2011-11-16 NOTE — Progress Notes (Signed)
Jerry Benson Date of Birth:  [birth year] [facility] [address] [place], [place]  [address] [phone]         Fax   [phone]  History of Present Illness: This pleasant 78 year old gentleman is seen for a scheduled followup office visit to he has a history of ischemic heart disease.  He had CABG in 1981 and a redo CABG in 2009.  He had PCI in 2010.  More recently in March 2013 he underwent drug-eluting stent to the saphenous vein graft to the obtuse marginal or crescendo angina.  He was noted to have severe native disease, a patent LIMA to the LAD, a patent saphenous vein graft to the PDA with severe retrograde disease to the posterior lateral that is not approachable.  Current Outpatient Prescriptions  Medication Sig Dispense Refill  . aspirin 81 MG tablet Take 81 mg by mouth daily.        . benazepril (LOTENSIN) 20 MG tablet Take 1 tablet (20 mg total) by mouth daily.  30 tablet  6  . clopidogrel (PLAVIX) 75 MG tablet Take 75 mg by mouth daily.      Marland Kitchen glipiZIDE (GLUCOTROL XL) 10 MG 24 hr tablet Take 10 mg by mouth daily.      Marland Kitchen glipiZIDE (GLUCOTROL XL) 10 MG 24 hr tablet TAKE 1 TABLET BY MOUTH EVERY DAY  30 tablet  6  . isosorbide dinitrate (ISORDIL) 20 MG tablet Take 20 mg by mouth 3 (three) times daily.      . metFORMIN (GLUCOPHAGE) 500 MG tablet Take 1 tablet (500 mg total) by mouth 3 (three) times daily with meals.      . metoprolol (LOPRESSOR) 50 MG tablet Take 25 mg by mouth 2 (two) times daily.      . nitroGLYCERIN (NITROSTAT) 0.4 MG SL tablet Place 0.4 mg under the tongue every 5 (five) minutes as needed. For chest pain      . rosuvastatin (CRESTOR) 10 MG tablet Take 5 mg by mouth daily.      . famotidine (PEPCID) 40 MG tablet Take 1 tablet (40 mg total) by mouth daily.  90 tablet  3  . DISCONTD: benazepril-hydrochlorthiazide (LOTENSIN HCT) 20-12.5 MG per tablet Take 1 tablet by mouth daily.  30 tablet  11    Allergies  Allergen Reactions  .  Amlodipine     headache  . Lipitor (Atorvastatin Calcium)   . Lisinopril     Rash / cough. However patient is currently taking Lotensin with no problems    Patient Active Problem List  Diagnoses  . Diabetes mellitus  . Hypercholesterolemia  . Hx of CABG  . Benign hypertensive heart disease without heart failure  . Dyspepsia  . CAD (coronary artery disease)  . Unstable angina pectoris  . CKD (chronic kidney disease) stage 3, GFR 30-59 ml/min  . Malaise and fatigue    History  Smoking status  . Never Smoker   Smokeless tobacco  . Never Used    History  Alcohol Use No    Family History  Problem Relation Age of Onset  . Stroke Daughter     09/2008  . Heart disease Father   . Heart attack Father   . Heart attack Brother   . Heart disease Mother     Review of Systems: Constitutional: no fever chills diaphoresis or fatigue or change in weight.  Head and neck: no hearing loss, no epistaxis, no photophobia or visual disturbance. Respiratory: No cough, shortness  of breath or wheezing. Cardiovascular: No chest pain peripheral edema, palpitations. Gastrointestinal: No abdominal distention, no abdominal pain, no change in bowel habits hematochezia or melena. Genitourinary: No dysuria, no frequency, no urgency, no nocturia. Musculoskeletal:No arthralgias, no back pain, no gait disturbance or myalgias. Neurological: No dizziness, no headaches, no numbness, no seizures, no syncope, no weakness, no tremors. Hematologic: No lymphadenopathy, no easy bruising. Psychiatric: No confusion, no hallucinations, no sleep disturbance.    Physical Exam: Filed Vitals:   11/16/11 1016  BP: 126/88  Pulse: 60   the general appearance reveals a slightly pale-appearing elderly gentleman in no distress.Pupils equal and reactive.   Extraocular Movements are full.  There is no scleral icterus.  The mouth and pharynx are normal.  The neck is supple.  The carotids reveal no bruits.  The jugular  venous pressure is normal.  The thyroid is not enlarged.  There is no lymphadenopathy.  The chest is clear to percussion and auscultation. There are no rales or rhonchi. Expansion of the chest is symmetrical.  The precordium is quiet.  The first heart sound is normal.  The second heart sound is physiologically split.  There is no murmur gallop rub or click.  There is no abnormal lift or heave.  The abdomen is soft and nontender. Bowel sounds are normal. The liver and spleen are not enlarged. There Are no abdominal masses. There are no bruits.  Extremities show mild ankle edema.Strength is normal and symmetrical in all extremities.  There is no lateralizing weakness.  There are no sensory deficits.  The skin is warm and dry.  There is a slight resolving rash on both forearms.   Assessment / Plan: Continue same medication except stop Protonix and switch to famotidine 40 mg daily.  Await results of today's labs to evaluate malaise and fatigue.  Recheck in 3 months for followup office visit fasting lipid panel hepatic function panel basal metabolic panel and hemoglobin Z6X

## 2011-11-16 NOTE — Assessment & Plan Note (Signed)
The patient has been averaging about 3-4 nitroglycerin per week for chest pain.

## 2011-11-16 NOTE — Assessment & Plan Note (Signed)
The patient has been very fatigued.  He feels weaker.  He does not have any known anemia.  He has not been aware of any hematochezia or melena or external blood loss.  He is only able to walk 6 minutes before having to stop and rest.  We are checking a CBC and thyroid function today as well as electrolytes

## 2011-11-16 NOTE — Assessment & Plan Note (Signed)
The patient does not check his blood sugars at home.  He has not been having any hypoglycemic episodes.

## 2011-11-16 NOTE — Assessment & Plan Note (Signed)
The patient has a history of dyspepsia secondary to GERD.  He does not think that the Protonix has been any more helpful than famotidine we will go back to famotidine 40 mg daily since it does not interfere with Plavix

## 2011-11-16 NOTE — Patient Instructions (Signed)
Will obtain labs today and call you with the results   Stop your Protonix and start Pepcid 40 mg daily, Rx sent to [facility]  Your physician recommends that you schedule a follow-up appointment in: 3 months with fasting labs (LP/BMET/HFP/A!C)

## 2011-11-16 NOTE — Progress Notes (Signed)
Quick Note:  Please report to patient. The recent labs are stable. Continue same medication and careful diet. No significant anemia to account for weakness.Thyroid okay ______

## 2011-11-24 ENCOUNTER — Other Ambulatory Visit: Payer: Self-pay | Admitting: Cardiology

## 2011-11-25 NOTE — Telephone Encounter (Signed)
Refilled metformin

## 2011-12-01 ENCOUNTER — Telehealth: Payer: Self-pay | Admitting: *Deleted

## 2011-12-01 NOTE — Telephone Encounter (Signed)
Advised of labs 

## 2011-12-01 NOTE — Telephone Encounter (Signed)
Message copied by Burnell Blanks on Tue Dec 01, 2011  1:59 PM ------      Message from: Cassell Clement      Created: Mon Nov 16, 2011  8:26 PM       Please report to patient.  The recent labs are stable. Continue same medication and careful diet. No significant anemia to account for weakness.Thyroid okay

## 2012-02-17 ENCOUNTER — Other Ambulatory Visit (INDEPENDENT_AMBULATORY_CARE_PROVIDER_SITE_OTHER): Payer: Medicare Other

## 2012-02-17 ENCOUNTER — Ambulatory Visit (INDEPENDENT_AMBULATORY_CARE_PROVIDER_SITE_OTHER): Payer: Medicare Other | Admitting: Cardiology

## 2012-02-17 ENCOUNTER — Telehealth: Payer: Self-pay | Admitting: *Deleted

## 2012-02-17 ENCOUNTER — Encounter: Payer: Self-pay | Admitting: Cardiology

## 2012-02-17 VITALS — BP 138/78 | HR 57 | Ht 66.0 in | Wt 142.0 lb

## 2012-02-17 DIAGNOSIS — R0989 Other specified symptoms and signs involving the circulatory and respiratory systems: Secondary | ICD-10-CM

## 2012-02-17 DIAGNOSIS — E119 Type 2 diabetes mellitus without complications: Secondary | ICD-10-CM

## 2012-02-17 DIAGNOSIS — I119 Hypertensive heart disease without heart failure: Secondary | ICD-10-CM

## 2012-02-17 DIAGNOSIS — E7800 Pure hypercholesterolemia, unspecified: Secondary | ICD-10-CM

## 2012-02-17 DIAGNOSIS — Z951 Presence of aortocoronary bypass graft: Secondary | ICD-10-CM

## 2012-02-17 DIAGNOSIS — R079 Chest pain, unspecified: Secondary | ICD-10-CM

## 2012-02-17 DIAGNOSIS — I2510 Atherosclerotic heart disease of native coronary artery without angina pectoris: Secondary | ICD-10-CM

## 2012-02-17 LAB — HEPATIC FUNCTION PANEL
ALT: 12 U/L (ref 0–53)
Alkaline Phosphatase: 53 U/L (ref 39–117)
Bilirubin, Direct: 0.1 mg/dL (ref 0.0–0.3)
Total Bilirubin: 0.9 mg/dL (ref 0.3–1.2)
Total Protein: 7.5 g/dL (ref 6.0–8.3)

## 2012-02-17 LAB — LIPID PANEL
Cholesterol: 123 mg/dL (ref 0–200)
LDL Cholesterol: 62 mg/dL (ref 0–99)
VLDL: 18.6 mg/dL (ref 0.0–40.0)

## 2012-02-17 LAB — BASIC METABOLIC PANEL
BUN: 14 mg/dL (ref 6–23)
Chloride: 104 mEq/L (ref 96–112)
Creatinine, Ser: 0.9 mg/dL (ref 0.4–1.5)
Glucose, Bld: 89 mg/dL (ref 70–99)

## 2012-02-17 MED ORDER — NITROGLYCERIN 0.4 MG SL SUBL: 0.4000 mg | SUBLINGUAL_TABLET | SUBLINGUAL | Status: DC | PRN

## 2012-02-17 NOTE — Progress Notes (Signed)
Jerry Benson Date of Birth:  Feb 04, 1929 [facility] [address] [place], [place]  [address] [phone]         Fax   [phone]  History of Present Illness: This pleasant 78 year old gentleman is seen for a scheduled followup office visit.  He has a history of known ischemic heart disease.  He had CABG in 1981 and a redo CABG by Dr. Laneta Simmers in 2009.  In March 2013 he underwent cardiac catheterization with a drug-eluting stent to the saphenous vein graft to the obtuse marginal because of crescendo angina.  The patient has significant dyslipidemia and known diabetes and a history of hypertension.  Current Outpatient Prescriptions  Medication Sig Dispense Refill  . aspirin 81 MG tablet Take 81 mg by mouth daily.        . benazepril (LOTENSIN) 20 MG tablet Take 1 tablet (20 mg total) by mouth daily.  30 tablet  6  . clopidogrel (PLAVIX) 75 MG tablet Take 75 mg by mouth daily.      . famotidine (PEPCID) 40 MG tablet Take 1 tablet (40 mg total) by mouth daily.  90 tablet  3  . glipiZIDE (GLUCOTROL XL) 10 MG 24 hr tablet Take 10 mg by mouth daily.      Marland Kitchen glipiZIDE (GLUCOTROL XL) 10 MG 24 hr tablet TAKE 1 TABLET BY MOUTH EVERY DAY  30 tablet  6  . isosorbide dinitrate (ISORDIL) 20 MG tablet Take 20 mg by mouth 3 (three) times daily.      . metFORMIN (GLUCOPHAGE) 500 MG tablet Take 1 tablet (500 mg total) by mouth 3 (three) times daily with meals.      . metFORMIN (GLUCOPHAGE) 500 MG tablet TAKE 1 TABLET BY MOUTH THREE TIMES DAILY  270 tablet  3  . metoprolol (LOPRESSOR) 50 MG tablet Take 25 mg by mouth 2 (two) times daily.      . nitroGLYCERIN (NITROSTAT) 0.4 MG SL tablet Place 1 tablet (0.4 mg total) under the tongue every 5 (five) minutes as needed. For chest pain  100 tablet  prn  . rosuvastatin (CRESTOR) 10 MG tablet Take 5 mg by mouth daily.      Marland Kitchen DISCONTD: nitroGLYCERIN (NITROSTAT) 0.4 MG SL tablet Place 0.4 mg under the tongue every 5 (five) minutes as needed. For  chest pain      . DISCONTD: benazepril-hydrochlorthiazide (LOTENSIN HCT) 20-12.5 MG per tablet Take 1 tablet by mouth daily.  30 tablet  11    Allergies  Allergen Reactions  . Amlodipine     headache  . Lipitor (Atorvastatin Calcium)   . Lisinopril     Rash / cough. However patient is currently taking Lotensin with no problems    Patient Active Problem List  Diagnosis  . Diabetes mellitus  . Hypercholesterolemia  . Hx of CABG  . Benign hypertensive heart disease without heart failure  . Dyspepsia  . CAD (coronary artery disease)  . Unstable angina pectoris  . CKD (chronic kidney disease) stage 3, GFR 30-59 ml/min  . Malaise and fatigue    History  Smoking status  . Never Smoker   Smokeless tobacco  . Never Used    History  Alcohol Use No    Family History  Problem Relation Age of Onset  . Stroke Daughter     09/2008  . Heart disease Father   . Heart attack Father   . Heart attack Brother   . Heart disease Mother  Review of Systems: Constitutional: no fever chills diaphoresis or fatigue or change in weight.  Head and neck: no hearing loss, no epistaxis, no photophobia or visual disturbance. Respiratory: No cough, shortness of breath or wheezing. Cardiovascular: No chest pain peripheral edema, palpitations. Gastrointestinal: No abdominal distention, no abdominal pain, no change in bowel habits hematochezia or melena. Genitourinary: No dysuria, no frequency, no urgency, no nocturia. Musculoskeletal:No arthralgias, no back pain, no gait disturbance or myalgias. Neurological: No dizziness, no headaches, no numbness, no seizures, no syncope, no weakness, no tremors. Hematologic: No lymphadenopathy, no easy bruising. Psychiatric: No confusion, no hallucinations, no sleep disturbance.    Physical Exam: Filed Vitals:   02/17/12 1013  BP: 138/78  Pulse: 57   the general appearance reveals a well-developed well-nourished elderly gentleman in no distress.The  head and neck exam reveals pupils equal and reactive.  Extraocular movements are full.  There is no scleral icterus.  The mouth and pharynx are normal.  The neck is supple.  The carotids reveal no bruits.  The jugular venous pressure is normal.  The  thyroid is not enlarged.  There is no lymphadenopathy.  The chest is clear to percussion and auscultation.  There are no rales or rhonchi.  Expansion of the chest is symmetrical.  The precordium is quiet.  The first heart sound is normal.  The second heart sound is physiologically split.  There is no murmur gallop rub or click.  There is no abnormal lift or heave.  The abdomen is soft and nontender.  The bowel sounds are normal.  The liver and spleen are not enlarged.  There are no abdominal masses.  There are no abdominal bruits.  Extremities reveal good pedal pulses.  There is no phlebitis or edema.  There is no cyanosis or clubbing.  Strength is normal and symmetrical in all extremities.  There is no lateralizing weakness.  There are no sensory deficits.  The skin is warm and dry.  There is seborrheic keratitis of the scalp and eyebrows etc.   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  Assessment / Plan: Continue on same medication.  We called him in a new prescription for sublingual nitroglycerin today.  To be rechecked in 3 months for followup office visit and a basal metabolic panel and a CBC.

## 2012-02-17 NOTE — Patient Instructions (Signed)
Your physician recommends that you continue on your current medications as directed. Please refer to the Current Medication list given to you today.  Your physician recommends that you schedule a follow-up appointment in: 3 months ov/bmet  Will obtain labs today and call you with the results (LP/BMET/HFP/A1C)

## 2012-02-17 NOTE — Assessment & Plan Note (Signed)
Blood pressure has been stable on current regimen.  His electrocardiogram today shows a pattern of an old lateral wall myocardial infarction and he has a very short PR interval.  This is unchanged from 09/22/11.

## 2012-02-17 NOTE — Telephone Encounter (Signed)
Message copied by Burnell Blanks on Wed Feb 17, 2012  3:44 PM ------      Message from: Cassell Clement      Created: Wed Feb 17, 2012  3:27 PM       Please report to patient.  The recent labs are stable. Continue same medication and careful diet.  His diabetic control is better and is hemoglobin A1c is down to 6.7

## 2012-02-17 NOTE — Assessment & Plan Note (Signed)
The patient has a history of long-standing diabetes mellitus.  He has not been experiencing any hypoglycemic episodes.  We're checking an A1c today.

## 2012-02-17 NOTE — Progress Notes (Signed)
Quick Note:  Please report to patient. The recent labs are stable. Continue same medication and careful diet. His diabetic control is better and is hemoglobin A1c is down to 6.7 ______

## 2012-02-17 NOTE — Assessment & Plan Note (Signed)
Since his most recent stent in March 2013 the patient has continued to have predictable exertional angina pectoris is not having any rest pain.  His angina is relieved by rest or by sublingual nitroglycerin.  The patient is no longer able to go for walks because of his chest pain.  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  We are refilling his sublingual nitroglycerin today and he gets 4 bottles at the time

## 2012-02-17 NOTE — Telephone Encounter (Signed)
Advised patient of lab results  

## 2012-03-11 ENCOUNTER — Other Ambulatory Visit: Payer: Self-pay | Admitting: Cardiology

## 2012-03-11 NOTE — Telephone Encounter (Signed)
Refilled metoprolol 

## 2012-04-07 ENCOUNTER — Other Ambulatory Visit: Payer: Self-pay | Admitting: Cardiology

## 2012-04-18 ENCOUNTER — Other Ambulatory Visit (HOSPITAL_COMMUNITY): Payer: Self-pay | Admitting: Nurse Practitioner

## 2012-04-20 ENCOUNTER — Other Ambulatory Visit: Payer: Self-pay | Admitting: Cardiology

## 2012-04-20 MED ORDER — BENAZEPRIL HCL 20 MG PO TABS: 20.0000 mg | ORAL_TABLET | Freq: Every day | ORAL | Status: DC

## 2012-05-19 ENCOUNTER — Ambulatory Visit (INDEPENDENT_AMBULATORY_CARE_PROVIDER_SITE_OTHER): Payer: Medicare Other | Admitting: Cardiology

## 2012-05-19 ENCOUNTER — Encounter: Payer: Self-pay | Admitting: Cardiology

## 2012-05-19 ENCOUNTER — Other Ambulatory Visit (INDEPENDENT_AMBULATORY_CARE_PROVIDER_SITE_OTHER): Payer: Medicare Other

## 2012-05-19 VITALS — BP 128/62 | HR 78 | Resp 18 | Ht 62.0 in | Wt 142.0 lb

## 2012-05-19 DIAGNOSIS — Z951 Presence of aortocoronary bypass graft: Secondary | ICD-10-CM

## 2012-05-19 DIAGNOSIS — R0989 Other specified symptoms and signs involving the circulatory and respiratory systems: Secondary | ICD-10-CM

## 2012-05-19 DIAGNOSIS — R1013 Epigastric pain: Secondary | ICD-10-CM

## 2012-05-19 DIAGNOSIS — I2510 Atherosclerotic heart disease of native coronary artery without angina pectoris: Secondary | ICD-10-CM

## 2012-05-19 DIAGNOSIS — I119 Hypertensive heart disease without heart failure: Secondary | ICD-10-CM

## 2012-05-19 DIAGNOSIS — E7800 Pure hypercholesterolemia, unspecified: Secondary | ICD-10-CM

## 2012-05-19 LAB — BASIC METABOLIC PANEL
CO2: 27 mEq/L (ref 19–32)
Chloride: 105 mEq/L (ref 96–112)
Creatinine, Ser: 1 mg/dL (ref 0.4–1.5)
Sodium: 139 mEq/L (ref 135–145)

## 2012-05-19 LAB — CBC WITH DIFFERENTIAL/PLATELET
Basophils Relative: 0.2 % (ref 0.0–3.0)
Eosinophils Relative: 4 % (ref 0.0–5.0)
Hemoglobin: 11.6 g/dL — ABNORMAL LOW (ref 13.0–17.0)
Lymphocytes Relative: 24.2 % (ref 12.0–46.0)
MCV: 88.1 fl (ref 78.0–100.0)
Monocytes Absolute: 0.4 10*3/uL (ref 0.1–1.0)
Neutro Abs: 3.1 10*3/uL (ref 1.4–7.7)
Neutrophils Relative %: 64.3 % (ref 43.0–77.0)
RBC: 4.04 Mil/uL — ABNORMAL LOW (ref 4.22–5.81)
WBC: 4.9 10*3/uL (ref 4.5–10.5)

## 2012-05-19 NOTE — Assessment & Plan Note (Signed)
Blood pressure stable on current medication.  No dizziness headaches or syncope.

## 2012-05-19 NOTE — Patient Instructions (Signed)
Will obtain labs today and call you with the results (bmet/cbc)  Your physician recommends that you continue on your current medications as directed. Please refer to the Current Medication list given to you today.  Your physician recommends that you schedule a follow-up appointment in: 4 months with fasting labs (lp/bmet/hfp/cbc/a1c) and ekg

## 2012-05-19 NOTE — Assessment & Plan Note (Signed)
The patient has stable exertional angina pectoris.  He gets a burning in his chest.  It is worse in the cold weather.  It is relieved by rest

## 2012-05-19 NOTE — Assessment & Plan Note (Signed)
The patient has not been having hypoglycemic episodes.

## 2012-05-19 NOTE — Progress Notes (Signed)
Quick Note:  Please report to patient. The recent labs are stable. Continue same medication and careful diet. Mild anemia slightly worse. Would add OTC multivitamin with iron. ______

## 2012-05-19 NOTE — Assessment & Plan Note (Signed)
The patient gets quick relief by using Pepcid for his symptoms of indigestion and GERD.  He has not been experiencing any hematochezia or melena

## 2012-05-19 NOTE — Progress Notes (Signed)
Jerry Benson Date of Birth:  [birth year] [facility] [address] [place], [place]  [address] [phone]         Fax   [phone]  History of Present Illness: This pleasant 78 year old gentleman is seen for a scheduled followup office visit. He has a history of known ischemic heart disease. He had CABG in 1981 and a redo CABG by Dr. Laneta Simmers in 2009. In March 2013 he underwent cardiac catheterization with a drug-eluting stent to the saphenous vein graft to the obtuse marginal because of crescendo angina. The patient has significant dyslipidemia and known diabetes and a history of hypertension. Since last visit he has had no new cardiac symptoms.  He has had some symptoms of GERD relieved by Pepcid  Current Outpatient Prescriptions  Medication Sig Dispense Refill  . aspirin 81 MG tablet Take 81 mg by mouth daily.        . benazepril (LOTENSIN) 20 MG tablet Take 1 tablet (20 mg total) by mouth daily.  30 tablet  6  . clopidogrel (PLAVIX) 75 MG tablet TAKE 1 TABLET BY MOUTH DAILY  90 tablet  3  . famotidine (PEPCID) 40 MG tablet Take 1 tablet (40 mg total) by mouth daily.  90 tablet  3  . glipiZIDE (GLUCOTROL XL) 10 MG 24 hr tablet TAKE 1 TABLET BY MOUTH EVERY DAY  30 tablet  6  . isosorbide dinitrate (ISORDIL) 20 MG tablet Take 20 mg by mouth 3 (three) times daily.      . metFORMIN (GLUCOPHAGE) 500 MG tablet TAKE 1 TABLET BY MOUTH THREE TIMES DAILY  270 tablet  3  . metoprolol (LOPRESSOR) 50 MG tablet TAKE 1/2 TABLET BY MOUTH TWICE DAILY  30 tablet  6  . nitroGLYCERIN (NITROSTAT) 0.4 MG SL tablet Place 1 tablet (0.4 mg total) under the tongue every 5 (five) minutes as needed. For chest pain  100 tablet  prn  . rosuvastatin (CRESTOR) 10 MG tablet Take 5 mg by mouth daily.      . [DISCONTINUED] clopidogrel (PLAVIX) 75 MG tablet Take 75 mg by mouth daily.      . [DISCONTINUED] glipiZIDE (GLUCOTROL XL) 10 MG 24 hr tablet Take 10 mg by mouth daily.      . [DISCONTINUED]  metFORMIN (GLUCOPHAGE) 500 MG tablet Take 1 tablet (500 mg total) by mouth 3 (three) times daily with meals.      . [DISCONTINUED] metoprolol (LOPRESSOR) 50 MG tablet Take 25 mg by mouth 2 (two) times daily.      . [DISCONTINUED] benazepril-hydrochlorthiazide (LOTENSIN HCT) 20-12.5 MG per tablet Take 1 tablet by mouth daily.  30 tablet  11    Allergies  Allergen Reactions  . Amlodipine     headache  . Lipitor (Atorvastatin Calcium)   . Lisinopril     Rash / cough. However patient is currently taking Lotensin with no problems    Patient Active Problem List  Diagnosis  . Type II or unspecified type diabetes mellitus without mention of complication, uncontrolled  . Hypercholesterolemia  . Hx of CABG  . Benign hypertensive heart disease without heart failure  . Dyspepsia  . CAD (coronary artery disease)  . Unstable angina pectoris  . CKD (chronic kidney disease) stage 3, GFR 30-59 ml/min  . Malaise and fatigue    History  Smoking status  . Never Smoker   Smokeless tobacco  . Never Used    History  Alcohol Use No    Family History  Problem  Relation Age of Onset  . Stroke Daughter     09/2008  . Heart disease Father   . Heart attack Father   . Heart attack Brother   . Heart disease Mother     Review of Systems: Constitutional: no fever chills diaphoresis or fatigue or change in weight.  Head and neck: no hearing loss, no epistaxis, no photophobia or visual disturbance. Respiratory: No cough, shortness of breath or wheezing. Cardiovascular: No chest pain peripheral edema, palpitations. Gastrointestinal: No abdominal distention, no abdominal pain, no change in bowel habits hematochezia or melena. Genitourinary: No dysuria, no frequency, no urgency, no nocturia. Musculoskeletal:No arthralgias, no back pain, no gait disturbance or myalgias. Neurological: No dizziness, no headaches, no numbness, no seizures, no syncope, no weakness, no tremors. Hematologic: No  lymphadenopathy, no easy bruising. Psychiatric: No confusion, no hallucinations, no sleep disturbance.    Physical Exam: Filed Vitals:   05/19/12 1004  BP: 128/62  Pulse: 78  Resp: 18   the general appearance reveals a well-developed elderly gentleman in no distress.The head and neck exam reveals pupils equal and reactive.  Extraocular movements are full.  There is no scleral icterus.  The mouth and pharynx are normal.  The neck is supple.  The carotids reveal no bruits.  The jugular venous pressure is normal.  The  thyroid is not enlarged.  There is no lymphadenopathy.  The chest is clear to percussion and auscultation.  There are no rales or rhonchi.  Expansion of the chest is symmetrical.  The precordium is quiet.  The first heart sound is normal.  The second heart sound is physiologically split.  There is soft systolic ejection murmur at the base  There is no abnormal lift or heave.  The abdomen is soft and nontender.  The bowel sounds are normal.  The liver and spleen are not enlarged.  There are no abdominal masses.  There are no abdominal bruits.  Extremities reveal good pedal pulses.  There is no phlebitis or edema.  There is no cyanosis or clubbing.  Strength is normal and symmetrical in all extremities.  There is no lateralizing weakness.  There are no sensory deficits.  The skin is warm and dry.  There is no rash.     Assessment / Plan: Continue same medication.  Recheck in 4 months for office visit EKG CBC A1c lipid panel basal metabolic panel and hepatic function panel

## 2012-05-20 ENCOUNTER — Telehealth: Payer: Self-pay | Admitting: Cardiology

## 2012-05-20 NOTE — Telephone Encounter (Signed)
Message copied by Burnell Blanks on Fri May 20, 2012  3:43 PM ------      Message from: Cassell Clement      Created: Thu May 19, 2012  8:48 PM       Please report to patient.  The recent labs are stable. Continue same medication and careful diet. Mild anemia slightly worse.  Would add OTC multivitamin with iron.

## 2012-05-20 NOTE — Telephone Encounter (Signed)
Advised patient of lab results  

## 2012-05-20 NOTE — Telephone Encounter (Signed)
Pt rtn call to melinda °

## 2012-06-14 ENCOUNTER — Telehealth: Payer: Self-pay | Admitting: Cardiology

## 2012-06-14 DIAGNOSIS — E119 Type 2 diabetes mellitus without complications: Secondary | ICD-10-CM

## 2012-06-14 NOTE — Telephone Encounter (Signed)
Pt needs refill of glipizide 10mg  extended release , [facility] faxed last week, pt out needs asap

## 2012-06-15 MED ORDER — GLIPIZIDE ER 10 MG PO TB24: 10.0000 mg | ORAL_TABLET | Freq: Every day | ORAL | Status: DC

## 2012-06-15 NOTE — Telephone Encounter (Signed)
Refilled as requested  

## 2012-07-25 ENCOUNTER — Other Ambulatory Visit: Payer: Self-pay

## 2012-07-25 MED ORDER — ROSUVASTATIN CALCIUM 10 MG PO TABS: 5.0000 mg | ORAL_TABLET | Freq: Every day | ORAL | Status: DC

## 2012-08-25 ENCOUNTER — Other Ambulatory Visit: Payer: Self-pay

## 2012-08-25 MED ORDER — ISOSORBIDE DINITRATE 20 MG PO TABS: 20.0000 mg | ORAL_TABLET | Freq: Three times a day (TID) | ORAL | Status: DC

## 2012-09-14 ENCOUNTER — Other Ambulatory Visit: Payer: Medicare Other

## 2012-09-14 ENCOUNTER — Ambulatory Visit: Payer: Medicare Other | Admitting: Cardiology

## 2012-09-15 ENCOUNTER — Other Ambulatory Visit: Payer: Self-pay | Admitting: *Deleted

## 2012-09-15 ENCOUNTER — Ambulatory Visit (INDEPENDENT_AMBULATORY_CARE_PROVIDER_SITE_OTHER): Payer: Medicare Other | Admitting: Cardiology

## 2012-09-15 ENCOUNTER — Encounter: Payer: Self-pay | Admitting: Cardiology

## 2012-09-15 VITALS — BP 138/64 | HR 60 | Ht 62.0 in | Wt 145.0 lb

## 2012-09-15 DIAGNOSIS — I259 Chronic ischemic heart disease, unspecified: Secondary | ICD-10-CM

## 2012-09-15 DIAGNOSIS — E785 Hyperlipidemia, unspecified: Secondary | ICD-10-CM

## 2012-09-15 DIAGNOSIS — I2510 Atherosclerotic heart disease of native coronary artery without angina pectoris: Secondary | ICD-10-CM

## 2012-09-15 DIAGNOSIS — D509 Iron deficiency anemia, unspecified: Secondary | ICD-10-CM | POA: Insufficient documentation

## 2012-09-15 DIAGNOSIS — I119 Hypertensive heart disease without heart failure: Secondary | ICD-10-CM

## 2012-09-15 DIAGNOSIS — E7800 Pure hypercholesterolemia, unspecified: Secondary | ICD-10-CM

## 2012-09-15 DIAGNOSIS — Z951 Presence of aortocoronary bypass graft: Secondary | ICD-10-CM

## 2012-09-15 LAB — BASIC METABOLIC PANEL
Calcium: 9.2 mg/dL (ref 8.4–10.5)
GFR: 66.07 mL/min (ref >60.00)
Glucose, Bld: 136 mg/dL — ABNORMAL HIGH (ref 70–99)
Potassium: 4.3 mEq/L (ref 3.5–5.1)
Sodium: 139 mEq/L (ref 135–145)

## 2012-09-15 LAB — LIPID PANEL
Cholesterol: 120 mg/dL (ref 0–200)
HDL: 37.6 mg/dL — ABNORMAL LOW (ref >39.00)
Triglycerides: 97 mg/dL (ref 0.0–149.0)
VLDL: 19.4 mg/dL (ref 0.0–40.0)

## 2012-09-15 LAB — CBC WITH DIFFERENTIAL/PLATELET
Basophils Absolute: 0 10*3/uL (ref 0.0–0.1)
Eosinophils Absolute: 0.2 10*3/uL (ref 0.0–0.7)
Hemoglobin: 11.7 g/dL — ABNORMAL LOW (ref 13.0–17.0)
Lymphocytes Relative: 17.4 % (ref 12.0–46.0)
MCHC: 32.8 g/dL (ref 30.0–36.0)
Monocytes Relative: 5.4 % (ref 3.0–12.0)
Neutro Abs: 4.6 10*3/uL (ref 1.4–7.7)
Neutrophils Relative %: 74.2 % (ref 43.0–77.0)
Platelets: 126 10*3/uL — ABNORMAL LOW (ref 150.0–400.0)
RDW: 13.5 % (ref 11.5–14.6)

## 2012-09-15 LAB — HEPATIC FUNCTION PANEL
ALT: 14 U/L (ref 0–53)
AST: 24 U/L (ref 0–37)
Total Protein: 6.6 g/dL (ref 6.0–8.3)

## 2012-09-15 LAB — HEMOGLOBIN A1C: Hgb A1c MFr Bld: 7 % — ABNORMAL HIGH (ref 4.6–6.5)

## 2012-09-15 MED ORDER — METOPROLOL TARTRATE 50 MG PO TABS: 25.0000 mg | ORAL_TABLET | Freq: Two times a day (BID) | ORAL | Status: DC

## 2012-09-15 NOTE — Assessment & Plan Note (Signed)
The patient has a past history of mild anemia.  He has not been aware of any hematochezia.  He is on multivitamin with iron.  His stools are dark.  We are checking a CBC today

## 2012-09-15 NOTE — Assessment & Plan Note (Signed)
The patient has occasional chest discomfort and occasionally takes a sublingual nitroglycerin.  There has not been any pattern of crescendo angina.  The patient has been less physically active during the winter.

## 2012-09-15 NOTE — Patient Instructions (Addendum)
Will obtain labs today and call you with the results (lp/bmet/fhp/cbc/a1c)  Your physician recommends that you continue on your current medications as directed. Please refer to the Current Medication list given to you today.  Your physician wants you to follow-up in: 4 months with fasting labs (lp/bmet/hfp/cbc/a1c)  You will receive a reminder letter in the mail two months in advance. If you don't receive a letter, please call our office to schedule the follow-up appointment.

## 2012-09-15 NOTE — Assessment & Plan Note (Signed)
Blood pressure has been stable on current therapy.  No headaches dizziness or syncope.

## 2012-09-15 NOTE — Progress Notes (Signed)
Quick Note:  Please report to patient. The recent labs are stable. Continue same medication and careful diet. A1C is 7.0 about the same. Hgb is stable. Still mild anemia so continue iron rich foods and multivitamin with iron. ______

## 2012-09-15 NOTE — Assessment & Plan Note (Signed)
The patient has adult onset diabetes and is on glipizide and metformin.  He denies any hypoglycemic episodes.

## 2012-09-15 NOTE — Assessment & Plan Note (Signed)
The patient has a history of hypercholesterolemia.  He is on Crestor which she is tolerating without any myalgias.  We are checking fasting lab work today

## 2012-09-15 NOTE — Progress Notes (Signed)
Jerry Benson Date of Birth:  Mar 24, 1929 [facility] [address] [place], [place]  [address] [phone]         Fax   [phone]  History of Present Illness: This pleasant 79 year old gentleman is seen for a scheduled followup office visit. He has a history of known ischemic heart disease. He had CABG in 1981 and a redo CABG by Dr. Laneta Simmers in 2009. In March 2013 he underwent cardiac catheterization by Dr. Riley Kill with a drug-eluting stent to the saphenous vein graft to the obtuse marginal because of crescendo angina. The patient has significant dyslipidemia and known diabetes and a history of hypertension.  Since last visit he has had no new cardiac symptoms. He has had some symptoms of GERD relieved by Pepcid    Current Outpatient Prescriptions  Medication Sig Dispense Refill  . aspirin 81 MG tablet Take 81 mg by mouth daily.        . benazepril (LOTENSIN) 20 MG tablet Take 1 tablet (20 mg total) by mouth daily.  30 tablet  6  . clopidogrel (PLAVIX) 75 MG tablet TAKE 1 TABLET BY MOUTH DAILY  90 tablet  3  . famotidine (PEPCID) 40 MG tablet Take 1 tablet (40 mg total) by mouth daily.  90 tablet  3  . glipiZIDE (GLUCOTROL XL) 10 MG 24 hr tablet Take 1 tablet (10 mg total) by mouth daily.  30 tablet  6  . isosorbide dinitrate (ISORDIL) 20 MG tablet Take 1 tablet (20 mg total) by mouth 3 (three) times daily.  90 tablet  5  . metFORMIN (GLUCOPHAGE) 500 MG tablet TAKE 1 TABLET BY MOUTH THREE TIMES DAILY  270 tablet  3  . metoprolol (LOPRESSOR) 50 MG tablet TAKE 1/2 TABLET BY MOUTH TWICE DAILY  30 tablet  6  . Multiple Vitamins-Iron (MULTIVITAMIN/IRON PO) Take by mouth.      . nitroGLYCERIN (NITROSTAT) 0.4 MG SL tablet Place 1 tablet (0.4 mg total) under the tongue every 5 (five) minutes as needed. For chest pain  100 tablet  prn  . rosuvastatin (CRESTOR) 10 MG tablet Take 0.5 tablets (5 mg total) by mouth daily.  45 tablet  6  . [DISCONTINUED]  benazepril-hydrochlorthiazide (LOTENSIN HCT) 20-12.5 MG per tablet Take 1 tablet by mouth daily.  30 tablet  11   No current facility-administered medications for this visit.    Allergies  Allergen Reactions  . Amlodipine     headache  . Lipitor (Atorvastatin Calcium)   . Lisinopril     Rash / cough. However patient is currently taking Lotensin with no problems    Patient Active Problem List  Diagnosis  . Type II or unspecified type diabetes mellitus without mention of complication, uncontrolled  . Hypercholesterolemia  . Hx of CABG  . Benign hypertensive heart disease without heart failure  . Dyspepsia  . CAD (coronary artery disease)  . Unstable angina pectoris  . CKD (chronic kidney disease) stage 3, GFR 30-59 ml/min  . Malaise and fatigue    History  Smoking status  . Never Smoker   Smokeless tobacco  . Never Used    History  Alcohol Use No    Family History  Problem Relation Age of Onset  . Stroke Daughter     09/2008  . Heart disease Father   . Heart attack Father   . Heart attack Brother   . Heart disease Mother     Review of Systems: Constitutional: no fever chills diaphoresis or  fatigue or change in weight.  Head and neck: no hearing loss, no epistaxis, no photophobia or visual disturbance. Respiratory: No cough, shortness of breath or wheezing. Cardiovascular: No chest pain peripheral edema, palpitations. Gastrointestinal: No abdominal distention, no abdominal pain, no change in bowel habits hematochezia or melena. Genitourinary: No dysuria, no frequency, no urgency, no nocturia. Musculoskeletal:No arthralgias, no back pain, no gait disturbance or myalgias. Neurological: No dizziness, no headaches, no numbness, no seizures, no syncope, no weakness, no tremors. Hematologic: No lymphadenopathy, no easy bruising. Psychiatric: No confusion, no hallucinations, no sleep disturbance.    Physical Exam: Filed Vitals:   09/15/12 1026  BP: 138/64    Pulse: 60   the general appearance reveals a well-developed well-nourished gentleman in no distress.  He is chronically slightly pale.The head and neck exam reveals pupils equal and reactive.  Extraocular movements are full.  There is no scleral icterus.  The mouth and pharynx are normal.  The neck is supple.  The carotids reveal no bruits.  The jugular venous pressure is normal.  The  thyroid is not enlarged.  There is no lymphadenopathy.  The chest is clear to percussion and auscultation.  There are no rales or rhonchi.  Expansion of the chest is symmetrical.  The precordium is quiet.  The first heart sound is normal.  The second heart sound is physiologically split.  There is no murmur gallop rub or click.  There is no abnormal lift or heave.  The abdomen is soft and nontender.  The bowel sounds are normal.  The liver and spleen are not enlarged.  There are no abdominal masses.  There are no abdominal bruits.  Extremities reveal good pedal pulses.  There is no phlebitis or edema.  There is no cyanosis or clubbing.  Strength is normal and symmetrical in all extremities.  There is no lateralizing weakness.  There are no sensory deficits.  The skin is warm and dry.  There is no rash.  EKG shows normal sinus rhythm at 60 per minute with short PR interval and a pattern of LVH with strain.  No significant change since 02/17/12   Assessment / Plan:  Continue same medication.  Recheck in 4 months for followup office visit CBC A1c lipid panel hepatic function panel and basal metabolic panel.

## 2012-09-16 ENCOUNTER — Telehealth: Payer: Self-pay | Admitting: *Deleted

## 2012-09-16 NOTE — Telephone Encounter (Signed)
Advised patient of lab results  

## 2012-09-16 NOTE — Telephone Encounter (Signed)
Message copied by Burnell Blanks on Fri Sep 16, 2012  1:37 PM ------      Message from: Cassell Clement      Created: Thu Sep 15, 2012  4:11 PM       Please report to patient.  The recent labs are stable. Continue same medication and careful diet. A1C is 7.0 about the same.  Hgb is stable.  Still mild anemia so continue iron rich foods and multivitamin with iron. ------

## 2012-10-05 ENCOUNTER — Other Ambulatory Visit: Payer: Self-pay | Admitting: *Deleted

## 2012-10-05 MED ORDER — METOPROLOL TARTRATE 50 MG PO TABS: 25.0000 mg | ORAL_TABLET | Freq: Two times a day (BID) | ORAL | Status: DC

## 2012-12-01 ENCOUNTER — Other Ambulatory Visit: Payer: Self-pay | Admitting: *Deleted

## 2012-12-01 MED ORDER — METFORMIN HCL 500 MG PO TABS: 500.0000 mg | ORAL_TABLET | Freq: Three times a day (TID) | ORAL | Status: DC

## 2012-12-15 ENCOUNTER — Other Ambulatory Visit: Payer: Self-pay | Admitting: *Deleted

## 2012-12-15 DIAGNOSIS — E119 Type 2 diabetes mellitus without complications: Secondary | ICD-10-CM

## 2012-12-15 MED ORDER — GLIPIZIDE ER 10 MG PO TB24: 10.0000 mg | ORAL_TABLET | Freq: Every day | ORAL | Status: DC

## 2013-01-12 ENCOUNTER — Encounter: Payer: Self-pay | Admitting: Cardiology

## 2013-01-12 ENCOUNTER — Ambulatory Visit (INDEPENDENT_AMBULATORY_CARE_PROVIDER_SITE_OTHER): Payer: Medicare Other | Admitting: Cardiology

## 2013-01-12 VITALS — BP 142/86 | HR 58 | Ht 66.0 in | Wt 140.1 lb

## 2013-01-12 DIAGNOSIS — E785 Hyperlipidemia, unspecified: Secondary | ICD-10-CM

## 2013-01-12 DIAGNOSIS — I491 Atrial premature depolarization: Secondary | ICD-10-CM

## 2013-01-12 DIAGNOSIS — I2510 Atherosclerotic heart disease of native coronary artery without angina pectoris: Secondary | ICD-10-CM

## 2013-01-12 DIAGNOSIS — B372 Candidiasis of skin and nail: Secondary | ICD-10-CM

## 2013-01-12 DIAGNOSIS — D509 Iron deficiency anemia, unspecified: Secondary | ICD-10-CM

## 2013-01-12 LAB — CBC WITH DIFFERENTIAL/PLATELET
Basophils Absolute: 0 10*3/uL (ref 0.0–0.1)
Eosinophils Relative: 4.1 % (ref 0.0–5.0)
HCT: 39 % (ref 39.0–52.0)
Hemoglobin: 13 g/dL (ref 13.0–17.0)
Lymphocytes Relative: 20.2 % (ref 12.0–46.0)
Lymphs Abs: 1.3 10*3/uL (ref 0.7–4.0)
Monocytes Relative: 5.5 % (ref 3.0–12.0)
Neutro Abs: 4.6 10*3/uL (ref 1.4–7.7)
Platelets: 132 10*3/uL — ABNORMAL LOW (ref 150.0–400.0)
RDW: 13 % (ref 11.5–14.6)
WBC: 6.5 10*3/uL (ref 4.5–10.5)

## 2013-01-12 LAB — HEMOGLOBIN A1C: Hgb A1c MFr Bld: 7.1 % — ABNORMAL HIGH (ref 4.6–6.5)

## 2013-01-12 LAB — BASIC METABOLIC PANEL
Calcium: 9.3 mg/dL (ref 8.4–10.5)
GFR: 58.22 mL/min — ABNORMAL LOW (ref >60.00)
Glucose, Bld: 114 mg/dL — ABNORMAL HIGH (ref 70–99)
Potassium: 4.3 mEq/L (ref 3.5–5.1)
Sodium: 133 mEq/L — ABNORMAL LOW (ref 135–145)

## 2013-01-12 LAB — HEPATIC FUNCTION PANEL
ALT: 14 U/L (ref 0–53)
AST: 24 U/L (ref 0–37)
Total Protein: 7.5 g/dL (ref 6.0–8.3)

## 2013-01-12 LAB — LIPID PANEL
Cholesterol: 128 mg/dL (ref 0–200)
HDL: 41.4 mg/dL (ref >39.00)
Triglycerides: 126 mg/dL (ref 0.0–149.0)
VLDL: 25.2 mg/dL (ref 0.0–40.0)

## 2013-01-12 MED ORDER — NYSTATIN-TRIAMCINOLONE 100000-0.1 UNIT/GM-% EX CREA: TOPICAL_CREAM | Freq: Two times a day (BID) | CUTANEOUS | Status: DC | PRN

## 2013-01-12 NOTE — Patient Instructions (Signed)
Will obtain labs today and call you with the results (lp/bmet/hfp/cbc/a1c)  Rx for Mycolog has been sent to [facility]  Your physician wants you to follow-up in: 4 months with fasting labs (lp/bmet/hfp) and ekg You will receive a reminder letter in the mail two months in advance. If you don't receive a letter, please call our office to schedule the follow-up appointment.

## 2013-01-12 NOTE — Progress Notes (Signed)
Jerry Benson Date of Birth:  02/04/1929 [facility] [address] [place], [place]  [address] [phone]         Fax   [phone]  History of Present Illness: This pleasant 79 year old gentleman is seen for a scheduled followup office visit. He has a history of known ischemic heart disease. He had CABG in 1981 and a redo CABG by Dr. Laneta Simmers in 2009. In March 2013 he underwent cardiac catheterization by Dr. Riley Kill with a drug-eluting stent to the saphenous vein graft to the obtuse marginal because of crescendo angina. The patient has significant dyslipidemia and known diabetes and a history of hypertension.  Since last visit he has had no new cardiac symptoms. He has had some symptoms of GERD relieved by Pepcid.  The patient has diabetes and recently has had some problems with intertriginous candidiasis.    Current Outpatient Prescriptions  Medication Sig Dispense Refill  . aspirin 81 MG tablet Take 81 mg by mouth daily.        . benazepril (LOTENSIN) 20 MG tablet Take 1 tablet (20 mg total) by mouth daily.  30 tablet  6  . clopidogrel (PLAVIX) 75 MG tablet TAKE 1 TABLET BY MOUTH DAILY  90 tablet  3  . glipiZIDE (GLUCOTROL XL) 10 MG 24 hr tablet Take 1 tablet (10 mg total) by mouth daily.  30 tablet  11  . isosorbide dinitrate (ISORDIL) 20 MG tablet Take 1 tablet (20 mg total) by mouth 3 (three) times daily.  90 tablet  5  . metFORMIN (GLUCOPHAGE) 500 MG tablet Take 1 tablet (500 mg total) by mouth 3 (three) times daily with meals.  270 tablet  3  . metoprolol (LOPRESSOR) 50 MG tablet Take 0.5 tablets (25 mg total) by mouth 2 (two) times daily.  30 tablet  6  . Multiple Vitamins-Iron (MULTIVITAMIN/IRON PO) Take by mouth.      . nitroGLYCERIN (NITROSTAT) 0.4 MG SL tablet Place 1 tablet (0.4 mg total) under the tongue every 5 (five) minutes as needed. For chest pain  100 tablet  prn  . rosuvastatin (CRESTOR) 10 MG tablet Take 0.5 tablets (5 mg total) by mouth daily.  45  tablet  6  . nystatin-triamcinolone (MYCOLOG II) cream Apply topically 2 (two) times daily as needed.  30 g  1  . [DISCONTINUED] benazepril-hydrochlorthiazide (LOTENSIN HCT) 20-12.5 MG per tablet Take 1 tablet by mouth daily.  30 tablet  11   No current facility-administered medications for this visit.    Allergies  Allergen Reactions  . Amlodipine     headache  . Lipitor (Atorvastatin Calcium)   . Lisinopril     Rash / cough. However patient is currently taking Lotensin with no problems    Patient Active Problem List   Diagnosis Date Noted  . Intertriginous candidiasis 01/12/2013  . Iron deficiency anemia 09/15/2012  . Malaise and fatigue 11/16/2011  . Unstable angina pectoris 09/19/2011  . CKD (chronic kidney disease) stage 3, GFR 30-59 ml/min 09/19/2011  . CAD (coronary artery disease) 01/21/2011  . Type II or unspecified type diabetes mellitus without mention of complication, uncontrolled 10/17/2010  . Hypercholesterolemia 10/17/2010  . Hx of CABG 10/17/2010  . Benign hypertensive heart disease without heart failure 10/17/2010  . Dyspepsia 10/17/2010    History  Smoking status  . Never Smoker   Smokeless tobacco  . Never Used    History  Alcohol Use No    Family History  Problem Relation Age of  Onset  . Stroke Daughter     09/2008  . Heart disease Father   . Heart attack Father   . Heart attack Brother   . Heart disease Mother     Review of Systems: Constitutional: no fever chills diaphoresis or fatigue or change in weight.  Head and neck: no hearing loss, no epistaxis, no photophobia or visual disturbance. Respiratory: No cough, shortness of breath or wheezing. Cardiovascular: No chest pain peripheral edema, palpitations. Gastrointestinal: No abdominal distention, no abdominal pain, no change in bowel habits hematochezia or melena. Genitourinary: No dysuria, no frequency, no urgency, no nocturia. Musculoskeletal:No arthralgias, no back pain, no gait  disturbance or myalgias. Neurological: No dizziness, no headaches, no numbness, no seizures, no syncope, no weakness, no tremors. Hematologic: No lymphadenopathy, no easy bruising. Psychiatric: No confusion, no hallucinations, no sleep disturbance.    Physical Exam: Filed Vitals:   01/12/13 1050  BP: 142/86  Pulse: 58   general reveals a well-developed in no distress.The head and neck exam reveals pupils equal and reactive.  Extraocular movements are full.  There is no scleral icterus.  The mouth and pharynx are normal.  The neck is supple.  The carotids reveal no bruits.  The jugular venous pressure is normal.  The  thyroid is not enlarged.  There is no lymphadenopathy.  The chest is clear to percussion and auscultation.  There are no rales or rhonchi.  Expansion of the chest is symmetrical.  The precordium is quiet.  The first heart sound is normal.  The second heart sound is physiologically split.  There is no murmur gallop rub or click.  There is no abnormal lift or heave.  The abdomen is soft and nontender.  The bowel sounds are normal.  The liver and spleen are not enlarged.  There are no abdominal masses.  There are no abdominal bruits.  Extremities reveal good pedal pulses.  There is no phlebitis or edema.  There is no cyanosis or clubbing.  Strength is normal and symmetrical in all extremities.  There is no lateralizing weakness.  There are no sensory deficits.  The skin is warm and dry.  There is an intertriginous rash involving the groin which appears to be chronic.    Assessment / Plan: Continue same medication.  Mycolog cream.  Recheck in 4 months for followup office visit EKG lipid panel hepatic function panel and basal metabolic panel.  Lab work today is pending including CBC and A1c.

## 2013-01-12 NOTE — Assessment & Plan Note (Signed)
He has had an intertriginous rash for several months.  It waxes and wanes but never goes away.  We will give him a trial of Mycolog cream to be applied several times a day when necessary.  If the rash fails to respond want him to see a dermatologist.

## 2013-01-12 NOTE — Assessment & Plan Note (Signed)
We are checking lab work today including and A1c.  His candidiasis suggest that maybe his diabetes has not been as well controlled.  He has not been having any hypoglycemic episodes

## 2013-01-12 NOTE — Assessment & Plan Note (Signed)
The patient has not been experiencing any recent chest pain or angina.  He has not had to take any recent sublingual nitroglycerin.  He does have some chest discomfort which he attributes to GERD and which responds to Pepcid or Zantac which she uses on a when necessary basis.

## 2013-01-13 NOTE — Progress Notes (Signed)
Quick Note:  Please report to patient. The recent labs are stable. Continue same medication and careful diet. Avoid sweets re diabetes. ______

## 2013-01-17 ENCOUNTER — Telehealth: Payer: Self-pay | Admitting: *Deleted

## 2013-01-17 NOTE — Telephone Encounter (Signed)
Message copied by Burnell Blanks on Tue Jan 17, 2013 10:30 AM ------      Message from: Cassell Clement      Created: Fri Jan 13, 2013 12:25 PM       Please report to patient.  The recent labs are stable. Continue same medication and careful diet. Avoid sweets re diabetes. ------

## 2013-01-17 NOTE — Telephone Encounter (Signed)
Advised patient of lab results  

## 2013-02-10 ENCOUNTER — Other Ambulatory Visit: Payer: Self-pay | Admitting: Cardiology

## 2013-02-20 ENCOUNTER — Other Ambulatory Visit: Payer: Self-pay | Admitting: Cardiology

## 2013-03-25 ENCOUNTER — Other Ambulatory Visit: Payer: Self-pay | Admitting: Cardiology

## 2013-04-03 ENCOUNTER — Other Ambulatory Visit: Payer: Self-pay | Admitting: Cardiology

## 2013-04-04 ENCOUNTER — Other Ambulatory Visit: Payer: Self-pay | Admitting: *Deleted

## 2013-04-28 ENCOUNTER — Other Ambulatory Visit: Payer: Self-pay | Admitting: Cardiology

## 2013-05-08 ENCOUNTER — Other Ambulatory Visit: Payer: Self-pay | Admitting: Cardiology

## 2013-05-19 ENCOUNTER — Encounter: Payer: Self-pay | Admitting: Cardiology

## 2013-05-19 ENCOUNTER — Ambulatory Visit (INDEPENDENT_AMBULATORY_CARE_PROVIDER_SITE_OTHER): Payer: Medicare Other | Admitting: Cardiology

## 2013-05-19 ENCOUNTER — Other Ambulatory Visit: Payer: Medicare Other

## 2013-05-19 VITALS — BP 150/80 | HR 74 | Ht 66.0 in | Wt 144.0 lb

## 2013-05-19 DIAGNOSIS — B372 Candidiasis of skin and nail: Secondary | ICD-10-CM

## 2013-05-19 DIAGNOSIS — IMO0001 Reserved for inherently not codable concepts without codable children: Secondary | ICD-10-CM

## 2013-05-19 DIAGNOSIS — I119 Hypertensive heart disease without heart failure: Secondary | ICD-10-CM

## 2013-05-19 DIAGNOSIS — I491 Atrial premature depolarization: Secondary | ICD-10-CM

## 2013-05-19 DIAGNOSIS — I2510 Atherosclerotic heart disease of native coronary artery without angina pectoris: Secondary | ICD-10-CM

## 2013-05-19 LAB — HEPATIC FUNCTION PANEL
AST: 25 U/L (ref 0–37)
Bilirubin, Direct: 0.1 mg/dL (ref 0.0–0.3)
Total Bilirubin: 1 mg/dL (ref 0.3–1.2)
Total Protein: 7.2 g/dL (ref 6.0–8.3)

## 2013-05-19 LAB — BASIC METABOLIC PANEL
BUN: 22 mg/dL (ref 6–23)
CO2: 25 mEq/L (ref 19–32)
Calcium: 9.8 mg/dL (ref 8.4–10.5)
Chloride: 103 mEq/L (ref 96–112)
Glucose, Bld: 142 mg/dL — ABNORMAL HIGH (ref 70–99)
Potassium: 4.1 mEq/L (ref 3.5–5.1)

## 2013-05-19 LAB — LIPID PANEL
LDL Cholesterol: 70 mg/dL (ref 0–99)
Total CHOL/HDL Ratio: 3
Triglycerides: 112 mg/dL (ref 0.0–149.0)
VLDL: 22.4 mg/dL (ref 0.0–40.0)

## 2013-05-19 NOTE — Assessment & Plan Note (Signed)
The patient is not having any hypoglycemic episodes 

## 2013-05-19 NOTE — Progress Notes (Signed)
Jerry Benson Date of Birth:  11/13/1928 [address] [place], [place]  [address] [phone]         Fax   [phone]  History of Present Illness: This pleasant 79 year old gentleman is seen for a scheduled followup office visit. He has a history of known ischemic heart disease. He had CABG in 1981 and a redo CABG by Dr. Laneta Simmers in 2009. In March 2013 he underwent cardiac catheterization by Dr. Riley Kill with a drug-eluting stent to the saphenous vein graft to the obtuse marginal because of crescendo angina. The patient has significant dyslipidemia and known diabetes and a history of hypertension.  Since last visit he has had no new cardiac symptoms. He has had some symptoms of GERD relieved by Pepcid.  The patient has diabetes and recently has had some problems with intertriginous candidiasis.    Current Outpatient Prescriptions  Medication Sig Dispense Refill  . aspirin 81 MG tablet Take 81 mg by mouth daily.        . benazepril (LOTENSIN) 20 MG tablet TAKE 1 TABLET BY MOUTH DAILY  30 tablet  6  . clopidogrel (PLAVIX) 75 MG tablet TAKE 1 TABLET BY MOUTH DAILY  90 tablet  0  . famotidine (PEPCID) 40 MG tablet TAKE 1 TABLET BY MOUTH EVERY DAY  90 tablet  0  . glipiZIDE (GLUCOTROL XL) 10 MG 24 hr tablet Take 1 tablet (10 mg total) by mouth daily.  30 tablet  11  . isosorbide dinitrate (ISORDIL) 20 MG tablet TAKE 1 TABLET BY MOUTH THREE TIMES DAILY  90 tablet  3  . metFORMIN (GLUCOPHAGE) 500 MG tablet Take 1 tablet (500 mg total) by mouth 3 (three) times daily with meals.  270 tablet  3  . metoprolol (LOPRESSOR) 50 MG tablet Take 0.5 tablets (25 mg total) by mouth 2 (two) times daily.  30 tablet  6  . Multiple Vitamins-Iron (MULTIVITAMIN/IRON PO) Take by mouth.      Marland Kitchen NITROSTAT 0.4 MG SL tablet PLACE 1 TABLET UNDER THE TONGUE EVERY 5 MINUTES AS NEEDED FOR CHEST PAIN  25 tablet  0  . nystatin-triamcinolone (MYCOLOG II) cream Apply topically 2 (two) times daily as needed.  30 g   1  . rosuvastatin (CRESTOR) 10 MG tablet Take 0.5 tablets (5 mg total) by mouth daily.  45 tablet  6  . doxycycline (VIBRA-TABS) 100 MG tablet       . mupirocin ointment (BACTROBAN) 2 %       . [DISCONTINUED] benazepril-hydrochlorthiazide (LOTENSIN HCT) 20-12.5 MG per tablet Take 1 tablet by mouth daily.  30 tablet  11   No current facility-administered medications for this visit.    Allergies  Allergen Reactions  . Amlodipine     headache  . Lipitor [Atorvastatin Calcium]   . Lisinopril     Rash / cough. However patient is currently taking Lotensin with no problems    Patient Active Problem List   Diagnosis Date Noted  . Intertriginous candidiasis 01/12/2013  . Iron deficiency anemia 09/15/2012  . Malaise and fatigue 11/16/2011  . Unstable angina pectoris 09/19/2011  . CKD (chronic kidney disease) stage 3, GFR 30-59 ml/min 09/19/2011  . CAD (coronary artery disease) 01/21/2011  . Type II or unspecified type diabetes mellitus without mention of complication, uncontrolled 10/17/2010  . Hypercholesterolemia 10/17/2010  . Hx of CABG 10/17/2010  . Benign hypertensive heart disease without heart failure 10/17/2010  . Dyspepsia 10/17/2010    History  Smoking status  .  Never Smoker   Smokeless tobacco  . Never Used    History  Alcohol Use No    Family History  Problem Relation Age of Onset  . Stroke Daughter     09/2008  . Heart disease Father   . Heart attack Father   . Heart attack Brother   . Heart disease Mother     Review of Systems: Constitutional: no fever chills diaphoresis or fatigue or change in weight.  Head and neck: no hearing loss, no epistaxis, no photophobia or visual disturbance. Respiratory: No cough, shortness of breath or wheezing. Cardiovascular: No chest pain peripheral edema, palpitations. Gastrointestinal: No abdominal distention, no abdominal pain, no change in bowel habits hematochezia or melena. Genitourinary: No dysuria, no frequency,  no urgency, no nocturia. Musculoskeletal:No arthralgias, no back pain, no gait disturbance or myalgias. Neurological: No dizziness, no headaches, no numbness, no seizures, no syncope, no weakness, no tremors. Hematologic: No lymphadenopathy, no easy bruising. Psychiatric: No confusion, no hallucinations, no sleep disturbance.    Physical Exam: Filed Vitals:   05/19/13 1030  BP: 150/80  Pulse: 74   general reveals a well-developed in no distress.The head and neck exam reveals pupils equal and reactive.  Extraocular movements are full.  There is no scleral icterus.  The mouth and pharynx are normal.  The neck is supple.  The carotids reveal no bruits.  The jugular venous pressure is normal.  The  thyroid is not enlarged.  There is no lymphadenopathy.  The chest is clear to percussion and auscultation.  There are no rales or rhonchi.  Expansion of the chest is symmetrical.  The precordium is quiet.  The first heart sound is normal.  The second heart sound is physiologically split.  There is no murmur gallop rub or click.  There is no abnormal lift or heave.  The abdomen is soft and nontender.  The bowel sounds are normal.  The liver and spleen are not enlarged.  There are no abdominal masses.  There are no abdominal bruits.  Extremities reveal good pedal pulses.  There is no phlebitis or edema.  There is no cyanosis or clubbing.  Strength is normal and symmetrical in all extremities.  There is no lateralizing weakness.  There are no sensory deficits.    EKG today shows sinus rhythm with short PR interval and LVH with strain pattern and is unchanged since 09/15/12.  Assessment / Plan: Continue same medication.  Use Mycolog cream on a when necessary basis if rash recurs.  Recheck in 4 months for followup office visit CBC, A1c lipid panel hepatic function panel and basal metabolic panel.  Lab work today is pending.

## 2013-05-19 NOTE — Assessment & Plan Note (Signed)
Since last visit he has had to take very few sublingual nitroglycerin.  When he does take it he takes it for what he describes as his indigestion

## 2013-05-19 NOTE — Assessment & Plan Note (Signed)
Patient reports that his intertriginous candidiasis responded very promptly to the Mycolog cream which we prescribed at his last visit

## 2013-05-19 NOTE — Assessment & Plan Note (Signed)
Blood pressure remained stable on current therapy.  No headaches or dizziness.  No syncope.  His EKG shows a short PR interval which is old

## 2013-05-19 NOTE — Patient Instructions (Signed)
Please avoid salt in your diet.  Your physician recommends that you continue on your current medications as directed. Please refer to the Current Medication list given to you today.  Your physician recommends that you schedule a follow-up appointment in: 4 MONTHS with Dr Patty Sermons   Your physician recommends that you return for lab work in: 4 MONTHS (Lipid, Liver, BMP, CBC and HgbA1c)

## 2013-05-20 NOTE — Progress Notes (Signed)
Quick Note:  Please report to patient. The recent labs are stable. Continue same medication and careful diet. ______ 

## 2013-07-03 ENCOUNTER — Other Ambulatory Visit: Payer: Self-pay | Admitting: *Deleted

## 2013-07-03 MED ORDER — NITROGLYCERIN 0.4 MG SL SUBL: SUBLINGUAL_TABLET | SUBLINGUAL | Status: DC

## 2013-07-09 ENCOUNTER — Other Ambulatory Visit: Payer: Self-pay | Admitting: Cardiology

## 2013-08-08 ENCOUNTER — Other Ambulatory Visit: Payer: Self-pay | Admitting: Cardiology

## 2013-08-31 ENCOUNTER — Encounter (HOSPITAL_COMMUNITY): Payer: Self-pay | Admitting: Emergency Medicine

## 2013-08-31 ENCOUNTER — Emergency Department (HOSPITAL_COMMUNITY)
Admission: EM | Admit: 2013-08-31 | Discharge: 2013-08-31 | Disposition: A | Discharge status: Home / Self Care | Payer: Medicare Other | Attending: Emergency Medicine | Admitting: Emergency Medicine

## 2013-08-31 DIAGNOSIS — B029 Zoster without complications: Secondary | ICD-10-CM

## 2013-08-31 DIAGNOSIS — I2510 Atherosclerotic heart disease of native coronary artery without angina pectoris: Secondary | ICD-10-CM | POA: Insufficient documentation

## 2013-08-31 DIAGNOSIS — Z7982 Long term (current) use of aspirin: Secondary | ICD-10-CM | POA: Insufficient documentation

## 2013-08-31 DIAGNOSIS — R110 Nausea: Secondary | ICD-10-CM | POA: Insufficient documentation

## 2013-08-31 DIAGNOSIS — Z7902 Long term (current) use of antithrombotics/antiplatelets: Secondary | ICD-10-CM | POA: Insufficient documentation

## 2013-08-31 DIAGNOSIS — N183 Chronic kidney disease, stage 3 unspecified: Secondary | ICD-10-CM | POA: Insufficient documentation

## 2013-08-31 DIAGNOSIS — E785 Hyperlipidemia, unspecified: Secondary | ICD-10-CM | POA: Insufficient documentation

## 2013-08-31 DIAGNOSIS — Z951 Presence of aortocoronary bypass graft: Secondary | ICD-10-CM | POA: Insufficient documentation

## 2013-08-31 DIAGNOSIS — Z9889 Other specified postprocedural states: Secondary | ICD-10-CM | POA: Insufficient documentation

## 2013-08-31 DIAGNOSIS — I129 Hypertensive chronic kidney disease with stage 1 through stage 4 chronic kidney disease, or unspecified chronic kidney disease: Secondary | ICD-10-CM | POA: Insufficient documentation

## 2013-08-31 DIAGNOSIS — I509 Heart failure, unspecified: Secondary | ICD-10-CM | POA: Insufficient documentation

## 2013-08-31 DIAGNOSIS — E119 Type 2 diabetes mellitus without complications: Secondary | ICD-10-CM | POA: Insufficient documentation

## 2013-08-31 DIAGNOSIS — K219 Gastro-esophageal reflux disease without esophagitis: Secondary | ICD-10-CM | POA: Insufficient documentation

## 2013-08-31 DIAGNOSIS — Z79899 Other long term (current) drug therapy: Secondary | ICD-10-CM | POA: Insufficient documentation

## 2013-08-31 LAB — CBC
HEMATOCRIT: 37.3 % — AB (ref 39.0–52.0)
Hemoglobin: 13 g/dL (ref 13.0–17.0)
MCH: 30.6 pg (ref 26.0–34.0)
MCHC: 34.9 g/dL (ref 30.0–36.0)
MCV: 87.8 fL (ref 78.0–100.0)
Platelets: 94 10*3/uL — ABNORMAL LOW (ref 150–400)
RBC: 4.25 MIL/uL (ref 4.22–5.81)
RDW: 12.6 % (ref 11.5–15.5)
WBC: 4.2 10*3/uL (ref 4.0–10.5)

## 2013-08-31 LAB — BASIC METABOLIC PANEL
BUN: 25 mg/dL — AB (ref 6–23)
CO2: 26 mEq/L (ref 19–32)
CREATININE: 1.23 mg/dL (ref 0.50–1.35)
Calcium: 10.1 mg/dL (ref 8.4–10.5)
Chloride: 96 mEq/L (ref 96–112)
GFR calc Af Amer: 61 mL/min — ABNORMAL LOW (ref >90)
GFR calc non Af Amer: 53 mL/min — ABNORMAL LOW (ref >90)
Glucose, Bld: 178 mg/dL — ABNORMAL HIGH (ref 70–99)
Potassium: 4 mEq/L (ref 3.7–5.3)
Sodium: 136 mEq/L — ABNORMAL LOW (ref 137–147)

## 2013-08-31 LAB — I-STAT TROPONIN, ED: Troponin i, poc: 0.01 ng/mL (ref 0.00–0.08)

## 2013-08-31 LAB — TROPONIN I: Troponin I: <0.3 ng/mL (ref <0.30)

## 2013-08-31 MED ORDER — VALACYCLOVIR HCL 1 G PO TABS: 1000.0000 mg | ORAL_TABLET | Freq: Three times a day (TID) | ORAL | Status: AC

## 2013-08-31 MED ORDER — ACETAMINOPHEN 325 MG PO TABS
650.0000 mg | ORAL_TABLET | Freq: Once | ORAL | Status: AC
  Administered 2013-08-31: 650 mg via ORAL
  Filled 2013-08-31: qty 2

## 2013-08-31 NOTE — ED Provider Notes (Signed)
5:43 PM Repeat trop negative. Will d/c home per Dr. Loretha StaplerWofford recommendations.   1. Herpes zoster      Jerry CiscoMegan E Jolaine Fryberger, MD 09/01/13 [id number]

## 2013-08-31 NOTE — ED Provider Notes (Signed)
CSN: [id number]     Arrival date & time 08/31/13  1259 History   First MD Initiated Contact with Patient 08/31/13 1302     Chief Complaint  Patient presents with  . Chest Pain  . Herpes Zoster     (Consider location/radiation/quality/duration/timing/severity/associated sxs/prior Treatment) Patient is a 80 y.o. male presenting with chest pain.  Chest Pain Pain location:  L chest Pain quality: sharp   Pain radiates to:  Does not radiate Pain severity:  Moderate Onset quality:  Gradual Duration:  1 week Timing:  Constant Progression:  Worsening Chronicity:  New Relieved by:  Nothing Worsened by:  Nothing tried Associated symptoms: nausea (earlier today)   Associated symptoms: no diaphoresis, no dizziness, no fever, no shortness of breath and not vomiting     Past Medical History  Diagnosis Date  . Coronary artery disease     a. Remote MI 1981;  b. Prior CABG per Dr. Hendricks Milo in 1981;  c. redo CABG in 2009;  d. PCI to SVG to DX and OM in 2010;  e. PCI/DES VG-> OM (ISR) w/ 2.75x96mm Resolute DES Mar. 2013  . Diabetes mellitus   . Hypertension   . Hyperlipidemia   . Chest pain   . GERD (gastroesophageal reflux disease)   . Status post insertion of non-drug eluting coronary artery stent 10/05/2008  . CHF (congestive heart failure)   . CKD (chronic kidney disease) stage 3, GFR 30-59 ml/min    Past Surgical History  Procedure Laterality Date  . Coronary artery bypass graft      original surgery in in 1981 with redo surgery in 2009. His original surgery was by Dr. Hendricks Milo and he had a SVG to the RCA, SVG to DX/OM, and SVG to DX/LAD; Redo surgery per Dr. Laneta Simmers in 2009 with LIMA to LAD and SVG to PD  . Cardiac catheterization  10/11/2008    PCI to SVG to diagonal and OM in 2010   Family History  Problem Relation Age of Onset  . Stroke Daughter     09/2008  . Heart disease Father   . Heart attack Father   . Heart attack Brother   . Heart disease Mother    History   Substance Use Topics  . Smoking status: Never Smoker   . Smokeless tobacco: Never Used  . Alcohol Use: No    Review of Systems  Constitutional: Negative for fever and diaphoresis.  Respiratory: Negative for shortness of breath.   Cardiovascular: Positive for chest pain.  Gastrointestinal: Positive for nausea (earlier today). Negative for vomiting.  Neurological: Negative for dizziness.  All other systems reviewed and are negative.      Allergies  Amlodipine; Lipitor; and Lisinopril  Home Medications   Current Outpatient Rx  Name  Route  Sig  Dispense  Refill  . aspirin 81 MG tablet   Oral   Take 81 mg by mouth daily.           . benazepril (LOTENSIN) 20 MG tablet      TAKE 1 TABLET BY MOUTH DAILY   30 tablet   6   . clopidogrel (PLAVIX) 75 MG tablet      TAKE 1 TABLET BY MOUTH DAILY   90 tablet   0   . famotidine (PEPCID) 40 MG tablet      TAKE 1 TABLET BY MOUTH EVERY DAY   90 tablet   0   . glipiZIDE (GLUCOTROL XL) 10 MG 24 hr tablet  Oral   Take 1 tablet (10 mg total) by mouth daily.   30 tablet   11   . isosorbide dinitrate (ISORDIL) 20 MG tablet      TAKE 1 TABLET BY MOUTH THREE TIMES DAILY   90 tablet   0   . metFORMIN (GLUCOPHAGE) 500 MG tablet   Oral   Take 1 tablet (500 mg total) by mouth 3 (three) times daily with meals.   270 tablet   3   . metoprolol (LOPRESSOR) 50 MG tablet   Oral   Take 0.5 tablets (25 mg total) by mouth 2 (two) times daily.   30 tablet   6   . Multiple Vitamins-Iron (MULTIVITAMIN/IRON PO)   Oral   Take by mouth.         . nitroGLYCERIN (NITROSTAT) 0.4 MG SL tablet      PLACE 1 TABLET UNDER THE TONGUE EVERY 5 MINUTES AS NEEDED FOR CHEST PAIN   25 tablet   3   . nystatin-triamcinolone (MYCOLOG II) cream   Topical   Apply topically 2 (two) times daily as needed.   30 g   1   . rosuvastatin (CRESTOR) 10 MG tablet   Oral   Take 0.5 tablets (5 mg total) by mouth daily.   45 tablet   6    BP  93/55  Pulse 71  Temp(Src) 98.2 F (36.8 C) (Oral)  Resp 20  Ht 5\' 5"  (1.651 m)  Wt 150 lb (68.04 kg)  BMI 24.96 kg/m2  SpO2 99% Physical Exam  Nursing note and vitals reviewed. Constitutional: He is oriented to person, place, and time. He appears well-developed and well-nourished. No distress.  HENT:  Head: Normocephalic and atraumatic.  Mouth/Throat: Oropharynx is clear and moist.  Eyes: Conjunctivae are normal. Pupils are equal, round, and reactive to light. No scleral icterus.  Neck: Neck supple.  Cardiovascular: Normal rate, regular rhythm, normal heart sounds and intact distal pulses.   No murmur heard. Pulmonary/Chest: Effort normal and breath sounds normal. No stridor. No respiratory distress. He has no wheezes. He has no rales. Left breast exhibits skin change (vesicular rash over left hemithorax below nipple line).  Abdominal: Soft. He exhibits no distension. There is no tenderness.  Musculoskeletal: Normal range of motion. He exhibits no edema.  Neurological: He is alert and oriented to person, place, and time.  Skin: Skin is warm and dry. No rash noted.  Psychiatric: He has a normal mood and affect. His behavior is normal.    ED Course  Procedures (including critical care time) Labs Review Labs Reviewed  CBC - Abnormal; Notable for the following:    HCT 37.3 (*)    Platelets 94 (*)    All other components within normal limits  BASIC METABOLIC PANEL - Abnormal; Notable for the following:    Sodium 136 (*)    Glucose, Bld 178 (*)    BUN 25 (*)    GFR calc non Af Amer 53 (*)    GFR calc Af Amer 61 (*)    All other components within normal limits  I-STAT TROPOININ, ED   Imaging Review No results found.  EKG Interpretation    Date/Time:  Thursday August 31 2013 13:05:28 EST Ventricular Rate:  94 PR Interval:  114 QRS Duration: 109 QT Interval:  355 QTC Calculation: 444 R Axis:   31 Text Interpretation:  Sinus rhythm Borderline short PR interval  Abnormal R-wave progression, early transition Probable LVH with secondary repol abnrm Probable  lateral infarct, age indeterminate No significant change was found Confirmed by [facility]  MD, TREY ([id number]) on 08/31/2013 2:11:25 PM            MDM   Final diagnoses:  Herpes zoster    80 yo male with hx of heart disease who presents with left sided chest pain for one week.  Started in left abdomen, but has localized to his left chest.  No SOB.  He has a vesicular rash consistent with Herpes zoster.  I think this is the cause of his chest pain.  With hx of CAD, will check delta troponin to rule out concurrent causes of chest pain, but have very low suspicion for ACS.  Story inconsistent with ACS or Dissection.    Candyce ChurnJohn David Juliona Vales III, MD 08/31/13 [phone]

## 2013-08-31 NOTE — Discharge Instructions (Signed)
Shingles Shingles (herpes zoster) is an infection that is caused by the same virus that causes chickenpox (varicella). The infection causes a painful skin rash and fluid-filled blisters, which eventually break open, crust over, and heal. It may occur in any area of the body, but it usually affects only one side of the body or face. The pain of shingles usually lasts about 1 month. However, some people with shingles may develop long-term (chronic) pain in the affected area of the body. Shingles often occurs many years after the person had chickenpox. It is more common:  In people older than 50 years.  In people with weakened immune systems, such as those with HIV, AIDS, or cancer.  In people taking medicines that weaken the immune system, such as transplant medicines.  In people under great stress. CAUSES  Shingles is caused by the varicella zoster virus (VZV), which also causes chickenpox. After a person is infected with the virus, it can remain in the person's body for years in an inactive state (dormant). To cause shingles, the virus reactivates and breaks out as an infection in a nerve root. The virus can be spread from person to person (contagious) through contact with open blisters of the shingles rash. It will only spread to people who have not had chickenpox. When these people are exposed to the virus, they may develop chickenpox. They will not develop shingles. Once the blisters scab over, the person is no longer contagious and cannot spread the virus to others. SYMPTOMS  Shingles shows up in stages. The initial symptoms may be pain, itching, and tingling in an area of the skin. This pain is usually described as burning, stabbing, or throbbing.In a few days or weeks, a painful red rash will appear in the area where the pain, itching, and tingling were felt. The rash is usually on one side of the body in a band or belt-like pattern. Then, the rash usually turns into fluid-filled blisters. They  will scab over and dry up in approximately 2 3 weeks. Flu-like symptoms may also occur with the initial symptoms, the rash, or the blisters. These may include:  Fever.  Chills.  Headache.  Upset stomach. DIAGNOSIS  Your caregiver will perform a skin exam to diagnose shingles. Skin scrapings or fluid samples may also be taken from the blisters. This sample will be examined under a microscope or sent to a lab for further testing. TREATMENT  There is no specific cure for shingles. Your caregiver will likely prescribe medicines to help you manage the pain, recover faster, and avoid long-term problems. This may include antiviral drugs, anti-inflammatory drugs, and pain medicines. HOME CARE INSTRUCTIONS   Take a cool bath or apply cool compresses to the area of the rash or blisters as directed. This may help with the pain and itching.   Only take over-the-counter or prescription medicines as directed by your caregiver.   Rest as directed by your caregiver.  Keep your rash and blisters clean with mild soap and cool water or as directed by your caregiver.  Do not pick your blisters or scratch your rash. Apply an anti-itch cream or numbing creams to the affected area as directed by your caregiver.  Keep your shingles rash covered with a loose bandage (dressing).  Avoid skin contact with:  Babies.   Pregnant women.   Children with eczema.   Elderly people with transplants.   People with chronic illnesses, such as leukemia or AIDS.   Wear loose-fitting clothing to help ease   the pain of material rubbing against the rash.  Keep all follow-up appointments with your caregiver.If the area involved is on your face, you may receive a referral for follow-up to a specialist, such as an eye doctor (ophthalmologist) or an ear, nose, and throat (ENT) doctor. Keeping all follow-up appointments will help you avoid eye complications, chronic pain, or disability.  SEEK IMMEDIATE MEDICAL  CARE IF:   You have facial pain, pain around the eye area, or loss of feeling on one side of your face.  You have ear pain or ringing in your ear.  You have loss of taste.  Your pain is not relieved with prescribed medicines.   Your redness or swelling spreads.   You have more pain and swelling.  Your condition is worsening or has changed.   You have a feveror persistent symptoms for more than 2 3 days.  You have a fever and your symptoms suddenly get worse. MAKE SURE YOU:  Understand these instructions.  Will watch your condition.  Will get help right away if you are not doing well or get worse. Document Released: 06/22/2005 Document Revised: 03/16/2012 Document Reviewed: 02/04/2012 [employer] Patient Information 2014 [employer].  

## 2013-08-31 NOTE — ED Notes (Addendum)
Pt from home, c/o epigastric pain. Since last Friday. Pt states some nausea, started in LLQ goes to epigastric area and into chest. Pt also has rash to left breast with pustules along 6 rib. Pt received 324 asa via ems and 1 ntiro with some relief pta. Daughter has same rash per pt

## 2013-09-01 ENCOUNTER — Telehealth: Payer: Self-pay | Admitting: Cardiology

## 2013-09-01 NOTE — Telephone Encounter (Signed)
New message         Pt was seen in ER last night and wants to come in to see dr Patty Sermonsbrackbill sooner

## 2013-09-01 NOTE — Telephone Encounter (Signed)
Wife calls b/c pt has the shingles. He was seen in the ED yesterday & has just picked up medication for shingles. ( shingles on the left breast area) States he occasionally takes Advil for muscle aches & will try this to help with the discomfort from the shingles.  Mylo Redebbie Brandolyn Shortridge RN

## 2013-09-02 NOTE — Telephone Encounter (Signed)
Agree with plan 

## 2013-09-11 ENCOUNTER — Other Ambulatory Visit: Payer: Self-pay | Admitting: Cardiology

## 2013-09-14 ENCOUNTER — Ambulatory Visit: Payer: Medicare Other | Admitting: Physician Assistant

## 2013-09-25 ENCOUNTER — Encounter: Payer: Self-pay | Admitting: Cardiology

## 2013-09-25 ENCOUNTER — Other Ambulatory Visit: Payer: Medicare Other

## 2013-09-25 ENCOUNTER — Ambulatory Visit (INDEPENDENT_AMBULATORY_CARE_PROVIDER_SITE_OTHER): Payer: Medicare Other | Admitting: Cardiology

## 2013-09-25 ENCOUNTER — Ambulatory Visit: Payer: Medicare Other | Admitting: Cardiology

## 2013-09-25 VITALS — BP 166/64 | HR 63 | Ht 65.0 in | Wt 138.0 lb

## 2013-09-25 DIAGNOSIS — E1165 Type 2 diabetes mellitus with hyperglycemia: Principal | ICD-10-CM

## 2013-09-25 DIAGNOSIS — I119 Hypertensive heart disease without heart failure: Secondary | ICD-10-CM

## 2013-09-25 DIAGNOSIS — IMO0001 Reserved for inherently not codable concepts without codable children: Secondary | ICD-10-CM

## 2013-09-25 DIAGNOSIS — B372 Candidiasis of skin and nail: Secondary | ICD-10-CM

## 2013-09-25 DIAGNOSIS — I491 Atrial premature depolarization: Secondary | ICD-10-CM

## 2013-09-25 DIAGNOSIS — I2510 Atherosclerotic heart disease of native coronary artery without angina pectoris: Secondary | ICD-10-CM

## 2013-09-25 DIAGNOSIS — B0229 Other postherpetic nervous system involvement: Secondary | ICD-10-CM | POA: Insufficient documentation

## 2013-09-25 DIAGNOSIS — E785 Hyperlipidemia, unspecified: Secondary | ICD-10-CM

## 2013-09-25 LAB — BASIC METABOLIC PANEL
BUN: 18 mg/dL (ref 6–23)
CO2: 29 mEq/L (ref 19–32)
Calcium: 9.8 mg/dL (ref 8.4–10.5)
Chloride: 104 mEq/L (ref 96–112)
Creatinine, Ser: 1.2 mg/dL (ref 0.4–1.5)
GFR: 64.58 mL/min (ref >60.00)
Glucose, Bld: 131 mg/dL — ABNORMAL HIGH (ref 70–99)
Potassium: 4.4 mEq/L (ref 3.5–5.1)
Sodium: 138 mEq/L (ref 135–145)

## 2013-09-25 LAB — CBC WITH DIFFERENTIAL/PLATELET
Basophils Absolute: 0 10*3/uL (ref 0.0–0.1)
Basophils Relative: 0.5 % (ref 0.0–3.0)
EOS PCT: 3.1 % (ref 0.0–5.0)
Eosinophils Absolute: 0.2 10*3/uL (ref 0.0–0.7)
HCT: 38.9 % — ABNORMAL LOW (ref 39.0–52.0)
Hemoglobin: 12.7 g/dL — ABNORMAL LOW (ref 13.0–17.0)
LYMPHS ABS: 1.4 10*3/uL (ref 0.7–4.0)
Lymphocytes Relative: 21.2 % (ref 12.0–46.0)
MCHC: 32.7 g/dL (ref 30.0–36.0)
MCV: 92.8 fl (ref 78.0–100.0)
Monocytes Absolute: 0.3 10*3/uL (ref 0.1–1.0)
Monocytes Relative: 5 % (ref 3.0–12.0)
NEUTROS ABS: 4.7 10*3/uL (ref 1.4–7.7)
Neutrophils Relative %: 70.2 % (ref 43.0–77.0)
PLATELETS: 128 10*3/uL — AB (ref 150.0–400.0)
RBC: 4.2 Mil/uL — ABNORMAL LOW (ref 4.22–5.81)
RDW: 13.4 % (ref 11.5–14.6)
WBC: 6.6 10*3/uL (ref 4.5–10.5)

## 2013-09-25 LAB — HEPATIC FUNCTION PANEL
ALT: 15 U/L (ref 0–53)
AST: 20 U/L (ref 0–37)
Albumin: 4.5 g/dL (ref 3.5–5.2)
Alkaline Phosphatase: 52 U/L (ref 39–117)
BILIRUBIN DIRECT: 0.1 mg/dL (ref 0.0–0.3)
BILIRUBIN TOTAL: 0.9 mg/dL (ref 0.3–1.2)
Total Protein: 7.2 g/dL (ref 6.0–8.3)

## 2013-09-25 LAB — LIPID PANEL
CHOL/HDL RATIO: 3
Cholesterol: 132 mg/dL (ref 0–200)
HDL: 40 mg/dL (ref >39.00)
LDL Cholesterol: 55 mg/dL (ref 0–99)
Triglycerides: 186 mg/dL — ABNORMAL HIGH (ref 0.0–149.0)
VLDL: 37.2 mg/dL (ref 0.0–40.0)

## 2013-09-25 LAB — HEMOGLOBIN A1C: HEMOGLOBIN A1C: 6.9 % — AB (ref 4.6–6.5)

## 2013-09-25 MED ORDER — GABAPENTIN 300 MG PO CAPS: 300.0000 mg | ORAL_CAPSULE | Freq: Every evening | ORAL | Status: DC | PRN

## 2013-09-25 MED ORDER — ROSUVASTATIN CALCIUM 10 MG PO TABS: 5.0000 mg | ORAL_TABLET | Freq: Every day | ORAL | Status: DC

## 2013-09-25 MED ORDER — NITROGLYCERIN 0.4 MG SL SUBL: SUBLINGUAL_TABLET | SUBLINGUAL | Status: DC

## 2013-09-25 MED ORDER — CLOPIDOGREL BISULFATE 75 MG PO TABS: ORAL_TABLET | ORAL | Status: DC

## 2013-09-25 NOTE — Progress Notes (Signed)
Jerry Benson Date of Birth:  01/25/1929 [address] [place], [place]  [id number] [phone]         Fax   [phone]  History of Present Illness: This pleasant 80 year old gentleman is seen for a scheduled followup office visit. He has a history of known ischemic heart disease. He had CABG in 1981 and a redo CABG by Dr. Laneta SimmersBartle in 2009. In March 2013 he underwent cardiac catheterization by Dr. Riley KillStuckey with a drug-eluting stent to the saphenous vein graft to the obtuse marginal because of crescendo angina. The patient has significant dyslipidemia and known diabetes and a history of hypertension.  Since last visit he has had no new cardiac symptoms. He has had some symptoms of GERD relieved by Pepcid.  The patient has diabetes and recently has had some problems with intertriginous candidiasis.  His most recent problem was acute herpes zoster and he was seen in the emergency room on 08/31/13.  He was treated with antiviral agents.  Current Outpatient Prescriptions  Medication Sig Dispense Refill  . aspirin 81 MG tablet Take 81 mg by mouth daily.        . benazepril (LOTENSIN) 20 MG tablet TAKE 1 TABLET BY MOUTH EVERY DAY  30 tablet  0  . clopidogrel (PLAVIX) 75 MG tablet TAKE 1 TABLET BY MOUTH DAILY  90 tablet  3  . famotidine (PEPCID) 40 MG tablet TAKE 1 TABLET BY MOUTH EVERY DAY  90 tablet  0  . glipiZIDE (GLUCOTROL XL) 10 MG 24 hr tablet Take 1 tablet (10 mg total) by mouth daily.  30 tablet  11  . isosorbide dinitrate (ISORDIL) 20 MG tablet TAKE 1 TABLET BY MOUTH THREE TIMES DAILY  90 tablet  0  . metFORMIN (GLUCOPHAGE) 500 MG tablet Take 1 tablet (500 mg total) by mouth 3 (three) times daily with meals.  270 tablet  3  . metoprolol (LOPRESSOR) 50 MG tablet Take 0.5 tablets (25 mg total) by mouth 2 (two) times daily.  30 tablet  6  . Multiple Vitamins-Iron (MULTIVITAMIN/IRON PO) Take by mouth.      . nitroGLYCERIN (NITROSTAT) 0.4 MG SL tablet ONE TABLET UNDER TONGUE AS  NEEDED FOR CHEST PAIN EVERY 5 MINUTES AS NEEDED FOR CHEST PAIN  25 tablet  prn  . nystatin-triamcinolone (MYCOLOG II) cream Apply topically 2 (two) times daily as needed.  30 g  1  . rosuvastatin (CRESTOR) 10 MG tablet Take 0.5 tablets (5 mg total) by mouth daily.  45 tablet  3  . gabapentin (NEURONTIN) 300 MG capsule Take 1 capsule (300 mg total) by mouth at bedtime as needed.  30 capsule  3  . [DISCONTINUED] benazepril-hydrochlorthiazide (LOTENSIN HCT) 20-12.5 MG per tablet Take 1 tablet by mouth daily.  30 tablet  11   No current facility-administered medications for this visit.    Allergies  Allergen Reactions  . Amlodipine     headache  . Lipitor [Atorvastatin Calcium]   . Lisinopril     Rash / cough. However patient is currently taking Lotensin with no problems    Patient Active Problem List   Diagnosis Date Noted  . Postherpetic neuralgia 09/25/2013  . Intertriginous candidiasis 01/12/2013  . Iron deficiency anemia 09/15/2012  . Malaise and fatigue 11/16/2011  . Unstable angina pectoris 09/19/2011  . CKD (chronic kidney disease) stage 3, GFR 30-59 ml/min 09/19/2011  . CAD (coronary artery disease) 01/21/2011  . Type II or unspecified type diabetes mellitus without mention  of complication, uncontrolled 10/17/2010  . Hypercholesterolemia 10/17/2010  . Hx of CABG 10/17/2010  . Benign hypertensive heart disease without heart failure 10/17/2010  . Dyspepsia 10/17/2010    History  Smoking status  . Never Smoker   Smokeless tobacco  . Never Used    History  Alcohol Use No    Family History  Problem Relation Age of Onset  . Stroke Daughter     09/2008  . Heart disease Father   . Heart attack Father   . Heart attack Brother   . Heart disease Mother     Review of Systems: Constitutional: no fever chills diaphoresis or fatigue or change in weight.  Head and neck: no hearing loss, no epistaxis, no photophobia or visual disturbance. Respiratory: No cough, shortness  of breath or wheezing. Cardiovascular: No chest pain peripheral edema, palpitations. Gastrointestinal: No abdominal distention, no abdominal pain, no change in bowel habits hematochezia or melena. Genitourinary: No dysuria, no frequency, no urgency, no nocturia. Musculoskeletal:No arthralgias, no back pain, no gait disturbance or myalgias. Neurological: No dizziness, no headaches, no numbness, no seizures, no syncope, no weakness, no tremors. Hematologic: No lymphadenopathy, no easy bruising. Psychiatric: No confusion, no hallucinations, no sleep disturbance.    Physical Exam: Filed Vitals:   09/25/13 1102  BP: 166/64  Pulse: 63   general reveals a well-developed in no distress.The head and neck exam reveals pupils equal and reactive.  Extraocular movements are full.  There is no scleral icterus.  The mouth and pharynx are normal.  The neck is supple.  The carotids reveal no bruits.  The jugular venous pressure is normal.  The  thyroid is not enlarged.  There is no lymphadenopathy.  The chest is clear to percussion and auscultation.  There are no rales or rhonchi.  Expansion of the chest is symmetrical.  The precordium is quiet.  The first heart sound is normal.  The second heart sound is physiologically split.  There is no murmur gallop rub or click.  There is no abnormal lift or heave.  The abdomen is soft and nontender.  The bowel sounds are normal.  The liver and spleen are not enlarged.  There are no abdominal masses.  There are no abdominal bruits.  Extremities reveal good pedal pulses.  There is no phlebitis or edema.  There is no cyanosis or clubbing.  Strength is normal and symmetrical in all extremities.  There is no lateralizing weakness.  There are no sensory deficits.    Assessment / Plan: Continue same medication.  Use Mycolog cream on a when necessary basis if rash recurs.  Recheck in 4 months for followup office visit, EKG, CBC, A1c lipid panel hepatic function panel and basal  metabolic panel.  Lab work today is pending.  The only new medication today is the gabapentin 300 mg one at at bedtime when necessary for postherpetic neuralgia.

## 2013-09-25 NOTE — Patient Instructions (Signed)
Will obtain labs today and call you with the results (LP/BMET/HFP/CBC/A1C)  START GABAPENTIN 300 MG DAILY AT BEDTIME AS NEEDED, [facility]  Your physician wants you to follow-up in: 4 months with fasting labs (lp/bmet/hfp/a1c) EKG You will receive a reminder letter in the mail two months in advance. If you don't receive a letter, please call our office to schedule the follow-up appointment.

## 2013-09-25 NOTE — Assessment & Plan Note (Signed)
The patient has not had to take any recent sublingual nitroglycerin.  He has not had any severe chest pain that he thought was from his heart.  He is having a lot of postherpetic neuralgia from the shingles which encompasses his left upper chest.

## 2013-09-25 NOTE — Progress Notes (Signed)
Quick Note:  Please report to patient. The recent labs are stable. Continue same medication and careful diet.A1C is better 6.9. ______

## 2013-09-25 NOTE — Assessment & Plan Note (Signed)
Blood pressure has been stable.  No headaches or dizzy spells.  No palpitations.

## 2013-09-25 NOTE — Assessment & Plan Note (Signed)
The patient has not been having any hypoglycemic episodes.  His appetite has been only fair since he had the shingles.

## 2013-09-25 NOTE — Assessment & Plan Note (Signed)
Despite the fact that he took antivirals, the patient still has a lot of postherpetic neuralgia pain in the left chest.  We will head gabapentin 300 mg at bedtime as necessary.

## 2013-09-26 ENCOUNTER — Telehealth: Payer: Self-pay | Admitting: *Deleted

## 2013-09-26 NOTE — Telephone Encounter (Signed)
Message copied by Burnell BlanksPRATT, Braedin Millhouse B on Tue Sep 26, 2013  2:22 PM ------      Message from: Cassell ClementBRACKBILL, THOMAS      Created: Mon Sep 25, 2013  9:22 PM       Please report to patient.  The recent labs are stable. Continue same medication and careful diet.A1C is better 6.9. ------

## 2013-09-26 NOTE — Telephone Encounter (Signed)
Mailed copy of labs and left message to call if any questions  

## 2013-10-05 ENCOUNTER — Other Ambulatory Visit: Payer: Self-pay | Admitting: Cardiology

## 2013-10-11 ENCOUNTER — Other Ambulatory Visit: Payer: Self-pay | Admitting: Cardiology

## 2013-11-09 ENCOUNTER — Other Ambulatory Visit: Payer: Self-pay | Admitting: Cardiology

## 2013-11-12 ENCOUNTER — Other Ambulatory Visit: Payer: Self-pay | Admitting: Cardiology

## 2013-11-24 ENCOUNTER — Other Ambulatory Visit: Payer: Self-pay | Admitting: Cardiology

## 2013-12-19 ENCOUNTER — Other Ambulatory Visit: Payer: Self-pay | Admitting: Cardiology

## 2014-01-02 ENCOUNTER — Other Ambulatory Visit: Payer: Self-pay | Admitting: Cardiology

## 2014-01-05 ENCOUNTER — Other Ambulatory Visit: Payer: Self-pay | Admitting: Cardiology

## 2014-01-22 ENCOUNTER — Other Ambulatory Visit: Payer: Self-pay | Admitting: Cardiology

## 2014-01-24 ENCOUNTER — Ambulatory Visit (INDEPENDENT_AMBULATORY_CARE_PROVIDER_SITE_OTHER): Payer: Medicare Other | Admitting: Cardiology

## 2014-01-24 ENCOUNTER — Other Ambulatory Visit: Payer: Medicare Other

## 2014-01-24 ENCOUNTER — Encounter: Payer: Self-pay | Admitting: Cardiology

## 2014-01-24 VITALS — BP 118/76 | HR 60 | Ht 65.0 in | Wt 136.0 lb

## 2014-01-24 DIAGNOSIS — E785 Hyperlipidemia, unspecified: Secondary | ICD-10-CM

## 2014-01-24 DIAGNOSIS — IMO0001 Reserved for inherently not codable concepts without codable children: Secondary | ICD-10-CM

## 2014-01-24 DIAGNOSIS — I119 Hypertensive heart disease without heart failure: Secondary | ICD-10-CM

## 2014-01-24 DIAGNOSIS — I2510 Atherosclerotic heart disease of native coronary artery without angina pectoris: Secondary | ICD-10-CM

## 2014-01-24 DIAGNOSIS — K3189 Other diseases of stomach and duodenum: Secondary | ICD-10-CM

## 2014-01-24 DIAGNOSIS — R1013 Epigastric pain: Secondary | ICD-10-CM

## 2014-01-24 DIAGNOSIS — I259 Chronic ischemic heart disease, unspecified: Secondary | ICD-10-CM

## 2014-01-24 DIAGNOSIS — I25118 Atherosclerotic heart disease of native coronary artery with other forms of angina pectoris: Secondary | ICD-10-CM

## 2014-01-24 DIAGNOSIS — E1165 Type 2 diabetes mellitus with hyperglycemia: Principal | ICD-10-CM

## 2014-01-24 DIAGNOSIS — I209 Angina pectoris, unspecified: Secondary | ICD-10-CM

## 2014-01-24 LAB — BASIC METABOLIC PANEL
BUN: 14 mg/dL (ref 6–23)
CALCIUM: 9.8 mg/dL (ref 8.4–10.5)
CHLORIDE: 104 meq/L (ref 96–112)
CO2: 26 meq/L (ref 19–32)
Creatinine, Ser: 1.2 mg/dL (ref 0.4–1.5)
GFR: 64.53 mL/min (ref >60.00)
GLUCOSE: 118 mg/dL — AB (ref 70–99)
POTASSIUM: 4.7 meq/L (ref 3.5–5.1)
SODIUM: 140 meq/L (ref 135–145)

## 2014-01-24 LAB — HEPATIC FUNCTION PANEL
ALT: 12 U/L (ref 0–53)
AST: 28 U/L (ref 0–37)
Albumin: 4.5 g/dL (ref 3.5–5.2)
Alkaline Phosphatase: 51 U/L (ref 39–117)
BILIRUBIN TOTAL: 1.3 mg/dL — AB (ref 0.2–1.2)
Bilirubin, Direct: 0.1 mg/dL (ref 0.0–0.3)
Total Protein: 7.3 g/dL (ref 6.0–8.3)

## 2014-01-24 LAB — LIPID PANEL
CHOL/HDL RATIO: 3
Cholesterol: 135 mg/dL (ref 0–200)
HDL: 43.5 mg/dL (ref >39.00)
LDL Cholesterol: 67 mg/dL (ref 0–99)
NONHDL: 91.5
Triglycerides: 121 mg/dL (ref 0.0–149.0)
VLDL: 24.2 mg/dL (ref 0.0–40.0)

## 2014-01-24 LAB — HEMOGLOBIN A1C: Hgb A1c MFr Bld: 6.8 % — ABNORMAL HIGH (ref 4.6–6.5)

## 2014-01-24 NOTE — Progress Notes (Signed)
Quick Note:  Please report to patient. The recent labs are stable. Continue same medication and careful diet. Hemoglobin A1c and blood sugar are better. ______

## 2014-01-24 NOTE — Assessment & Plan Note (Signed)
The patient reports that his dyspepsia symptoms have improved with current therapy

## 2014-01-24 NOTE — Assessment & Plan Note (Signed)
The patient has not been having any recent chest pain or palpitations.  No symptoms of CHF.

## 2014-01-24 NOTE — Progress Notes (Signed)
Jerry Benson Date of Birth:  11/21/1928 [facility] [address] [place], [place]  [id number] [phone]        Fax   [phone]   History of Present Illness: This pleasant 80 year old gentleman is seen for a scheduled followup office visit. He has a history of known ischemic heart disease. He had CABG in 1981 and a redo CABG by Dr. Laneta SimmersBartle in 2009. In March 2013 he underwent cardiac catheterization by Dr. Riley KillStuckey with a drug-eluting stent to the saphenous vein graft to the obtuse marginal because of crescendo angina. The patient has significant dyslipidemia and known diabetes and a history of hypertension.  Since last visit he has had no new cardiac symptoms. He has had some symptoms of GERD relieved by Pepcid. The patient has diabetes and recently has had some problems with intertriginous candidiasis. His most recent problem was acute herpes zoster and he was seen in the emergency room on 08/31/13. He was treated with antiviral agents. Since last visit he reports that his twin brother died.  His brother also has diabetes and vascular disease I believe.   Current Outpatient Prescriptions  Medication Sig Dispense Refill  . aspirin 81 MG tablet Take 81 mg by mouth daily.        . benazepril (LOTENSIN) 20 MG tablet TAKE 1 TABLET BY MOUTH EVERY DAY.  30 tablet  5  . clopidogrel (PLAVIX) 75 MG tablet TAKE 1 TABLET BY MOUTH DAILY  90 tablet  3  . famotidine (PEPCID) 40 MG tablet TAKE 1 TABLET BY MOUTH EVERY DAY  90 tablet  0  . gabapentin (NEURONTIN) 300 MG capsule Take 1 capsule (300 mg total) by mouth at bedtime as needed.  30 capsule  3  . glipiZIDE (GLUCOTROL XL) 10 MG 24 hr tablet TAKE 1 TABLET BY MOUTH EVERY DAY  30 tablet  6  . isosorbide dinitrate (ISORDIL) 20 MG tablet TAKE 1 TABLET BY MOUTH THREE TIMES DAILY  90 tablet  5  . metFORMIN (GLUCOPHAGE) 500 MG tablet TAKE 1 TABLET BY MOUTH THREE TIMES DAILY WITH MEALS  270 tablet  0  . metoprolol (LOPRESSOR) 50 MG  tablet Take 0.5 tablets (25 mg total) by mouth 2 (two) times daily.  30 tablet  6  . metoprolol (LOPRESSOR) 50 MG tablet TAKE 1/2 TABLET BY MOUTH TWICE DAILY  30 tablet  4  . Multiple Vitamins-Iron (MULTIVITAMIN/IRON PO) Take by mouth.      . nitroGLYCERIN (NITROSTAT) 0.4 MG SL tablet ONE TABLET UNDER TONGUE AS NEEDED FOR CHEST PAIN EVERY 5 MINUTES AS NEEDED FOR CHEST PAIN  25 tablet  prn  . nystatin-triamcinolone (MYCOLOG II) cream Apply topically 2 (two) times daily as needed.  30 g  1  . rosuvastatin (CRESTOR) 10 MG tablet Take 0.5 tablets (5 mg total) by mouth daily.  45 tablet  3  . [DISCONTINUED] benazepril-hydrochlorthiazide (LOTENSIN HCT) 20-12.5 MG per tablet Take 1 tablet by mouth daily.  30 tablet  11   No current facility-administered medications for this visit.    Allergies  Allergen Reactions  . Amlodipine     headache  . Lipitor [Atorvastatin Calcium]   . Lisinopril     Rash / cough. However patient is currently taking Lotensin with no problems    Patient Active Problem List   Diagnosis Date Noted  . Postherpetic neuralgia 09/25/2013  . Intertriginous candidiasis 01/12/2013  . Iron deficiency anemia 09/15/2012  . Malaise and fatigue 11/16/2011  .  Unstable angina pectoris 09/19/2011  . CKD (chronic kidney disease) stage 3, GFR 30-59 ml/min 09/19/2011  . CAD (coronary artery disease) 01/21/2011  . Type II or unspecified type diabetes mellitus without mention of complication, uncontrolled 10/17/2010  . Hypercholesterolemia 10/17/2010  . Hx of CABG 10/17/2010  . Benign hypertensive heart disease without heart failure 10/17/2010  . Dyspepsia 10/17/2010    History  Smoking status  . Never Smoker   Smokeless tobacco  . Never Used    History  Alcohol Use No    Family History  Problem Relation Age of Onset  . Stroke Daughter     09/2008  . Heart disease Father   . Heart attack Father   . Heart attack Brother   . Heart disease Mother     Review of  Systems: Constitutional: no fever chills diaphoresis or fatigue or change in weight.  Head and neck: no hearing loss, no epistaxis, no photophobia or visual disturbance. Respiratory: No cough, shortness of breath or wheezing. Cardiovascular: No chest pain peripheral edema, palpitations. Gastrointestinal: No abdominal distention, no abdominal pain, no change in bowel habits hematochezia or melena. Genitourinary: No dysuria, no frequency, no urgency, no nocturia. Musculoskeletal:No arthralgias, no back pain, no gait disturbance or myalgias. Neurological: No dizziness, no headaches, no numbness, no seizures, no syncope, no weakness, no tremors. Hematologic: No lymphadenopathy, no easy bruising. Psychiatric: No confusion, no hallucinations, no sleep disturbance.    Physical Exam: Filed Vitals:   01/24/14 0948  BP: 118/76  Pulse: 60   the general appearance reveals a well-developed well-nourished gentleman in no distress.The head and neck exam reveals pupils equal and reactive.  Extraocular movements are full.  There is no scleral icterus.  The mouth and pharynx are normal.  The neck is supple.  The carotids reveal no bruits.  The jugular venous pressure is normal.  The  thyroid is not enlarged.  There is no lymphadenopathy.  The chest is clear to percussion and auscultation.  There are no rales or rhonchi.  Expansion of the chest is symmetrical.  The precordium is quiet.  The first heart sound is accentuated because of the short PR interval.  The second heart sound is physiologically split.  There is no murmur gallop rub or click.  There is no abnormal lift or heave.  The abdomen is soft and nontender.  The bowel sounds are normal.  The liver and spleen are not enlarged.  There are no abdominal masses.  There are no abdominal bruits.  Extremities reveal good pedal pulses.  There is no phlebitis or edema.  There is no cyanosis or clubbing.  Strength is normal and symmetrical in all extremities.  There  is no lateralizing weakness.  There are no sensory deficits.  The skin is warm and dry.  There is no rash.  EKG today shows junctional rhythm unchanged from 08/31/13.   Assessment / Plan: 1. ischemic heart disease status post CABG in 1981 and redo CABG in 2009.  Most recent cardiac catheter in March 2013 with a drug-eluting stent to the saphenous vein graft to the obtuse marginal. 2. hypertensive heart disease without heart failure 3. diabetes mellitus adult onset 4. Dyslipidemia  Disposition continue same medication.  Recheck in 4 months for office visit lipid panel hepatic function panel basal metabolic panel and A1c

## 2014-01-24 NOTE — Assessment & Plan Note (Signed)
The patient is not having any hypoglycemic episodes.  We are checking and A1c today. 

## 2014-01-24 NOTE — Patient Instructions (Signed)
Will obtain labs today and call you with the results (bmet/hfp/lp/a1c)  Your physician recommends that you continue on your current medications as directed. Please refer to the Current Medication list given to you today.  Your physician recommends that you schedule a follow-up appointment in: 4 months with fasting labs (lp/bmet/hfp/a1c)

## 2014-01-24 NOTE — Assessment & Plan Note (Signed)
The patient denies any recurrent chest pain or angina.  He is walking short distances without symptoms.  He does have easy fatigue

## 2014-03-05 ENCOUNTER — Other Ambulatory Visit: Payer: Self-pay | Admitting: Cardiology

## 2014-03-29 ENCOUNTER — Other Ambulatory Visit: Payer: Self-pay | Admitting: Cardiology

## 2014-05-28 ENCOUNTER — Encounter: Payer: Self-pay | Admitting: Cardiology

## 2014-05-28 ENCOUNTER — Other Ambulatory Visit: Payer: Medicare Other | Admitting: *Deleted

## 2014-05-28 ENCOUNTER — Ambulatory Visit (INDEPENDENT_AMBULATORY_CARE_PROVIDER_SITE_OTHER): Payer: Medicare Other | Admitting: Cardiology

## 2014-05-28 VITALS — BP 148/80 | HR 59 | Ht 65.0 in | Wt 138.0 lb

## 2014-05-28 DIAGNOSIS — R413 Other amnesia: Secondary | ICD-10-CM | POA: Insufficient documentation

## 2014-05-28 DIAGNOSIS — E785 Hyperlipidemia, unspecified: Secondary | ICD-10-CM

## 2014-05-28 DIAGNOSIS — E1165 Type 2 diabetes mellitus with hyperglycemia: Secondary | ICD-10-CM

## 2014-05-28 DIAGNOSIS — I2510 Atherosclerotic heart disease of native coronary artery without angina pectoris: Secondary | ICD-10-CM

## 2014-05-28 DIAGNOSIS — I2583 Coronary atherosclerosis due to lipid rich plaque: Secondary | ICD-10-CM

## 2014-05-28 DIAGNOSIS — I119 Hypertensive heart disease without heart failure: Secondary | ICD-10-CM

## 2014-05-28 DIAGNOSIS — I259 Chronic ischemic heart disease, unspecified: Secondary | ICD-10-CM

## 2014-05-28 DIAGNOSIS — D509 Iron deficiency anemia, unspecified: Secondary | ICD-10-CM

## 2014-05-28 LAB — CBC WITH DIFFERENTIAL/PLATELET
BASOS ABS: 0 10*3/uL (ref 0.0–0.1)
Basophils Relative: 0.3 % (ref 0.0–3.0)
EOS ABS: 0.2 10*3/uL (ref 0.0–0.7)
Eosinophils Relative: 3.3 % (ref 0.0–5.0)
HCT: 41.2 % (ref 39.0–52.0)
Hemoglobin: 13.3 g/dL (ref 13.0–17.0)
LYMPHS PCT: 24.6 % (ref 12.0–46.0)
Lymphs Abs: 1.5 10*3/uL (ref 0.7–4.0)
MCHC: 32.2 g/dL (ref 30.0–36.0)
MCV: 91.6 fl (ref 78.0–100.0)
MONOS PCT: 5.9 % (ref 3.0–12.0)
Monocytes Absolute: 0.4 10*3/uL (ref 0.1–1.0)
Neutro Abs: 4.1 10*3/uL (ref 1.4–7.7)
Neutrophils Relative %: 65.9 % (ref 43.0–77.0)
Platelets: 127 10*3/uL — ABNORMAL LOW (ref 150.0–400.0)
RBC: 4.5 Mil/uL (ref 4.22–5.81)
RDW: 12.5 % (ref 11.5–15.5)
WBC: 6.2 10*3/uL (ref 4.0–10.5)

## 2014-05-28 LAB — BASIC METABOLIC PANEL
BUN: 18 mg/dL (ref 6–23)
CHLORIDE: 103 meq/L (ref 96–112)
CO2: 26 meq/L (ref 19–32)
Calcium: 9.4 mg/dL (ref 8.4–10.5)
Creatinine, Ser: 1.1 mg/dL (ref 0.4–1.5)
GFR: 70.83 mL/min (ref >60.00)
GLUCOSE: 128 mg/dL — AB (ref 70–99)
POTASSIUM: 4.4 meq/L (ref 3.5–5.1)
Sodium: 140 mEq/L (ref 135–145)

## 2014-05-28 LAB — LIPID PANEL
CHOL/HDL RATIO: 3
Cholesterol: 150 mg/dL (ref 0–200)
HDL: 43.4 mg/dL (ref >39.00)
LDL Cholesterol: 85 mg/dL (ref 0–99)
NonHDL: 106.6
Triglycerides: 107 mg/dL (ref 0.0–149.0)
VLDL: 21.4 mg/dL (ref 0.0–40.0)

## 2014-05-28 LAB — HEPATIC FUNCTION PANEL
ALK PHOS: 59 U/L (ref 39–117)
ALT: 17 U/L (ref 0–53)
AST: 22 U/L (ref 0–37)
Albumin: 4.4 g/dL (ref 3.5–5.2)
BILIRUBIN DIRECT: 0.1 mg/dL (ref 0.0–0.3)
TOTAL PROTEIN: 7.1 g/dL (ref 6.0–8.3)
Total Bilirubin: 0.7 mg/dL (ref 0.2–1.2)

## 2014-05-28 LAB — HEMOGLOBIN A1C: HEMOGLOBIN A1C: 7.9 % — AB (ref 4.6–6.5)

## 2014-05-28 MED ORDER — DONEPEZIL HCL 5 MG PO TABS: 5.0000 mg | ORAL_TABLET | Freq: Every day | ORAL | Status: DC

## 2014-05-28 NOTE — Assessment & Plan Note (Signed)
We will give the patient a trial of Aricept generic 5 mg at bedtime for his memory disorder.

## 2014-05-28 NOTE — Assessment & Plan Note (Signed)
Blood pressure is remaining stable on current therapy.  No symptoms of congestive heart failure.. 

## 2014-05-28 NOTE — Patient Instructions (Signed)
Will obtain labs today and call you with the results (LP/BMET/HFP/A1C/CBC)  START ARICEPT 5 MG AT BEDTIME, RX SENT TO YOUR PHARMACY   Your physician recommends that you schedule a follow-up appointment in: 4 MONTH OV/EKG/BMET

## 2014-05-28 NOTE — Progress Notes (Signed)
Jerry Benson Date of Birth:  02/14/1929 [facility] [address] [place], [place]  [id number] [phone]        Fax   [phone]   History of Present Illness: This pleasant 80 year old gentleman is seen for a scheduled followup office visit. He has a history of known ischemic heart disease. He had CABG in 1981 and a redo CABG by Dr. Laneta SimmersBartle in 2009. In March 2013 he underwent cardiac catheterization by Dr. Riley KillStuckey with a drug-eluting stent to the saphenous vein graft to the obtuse marginal because of crescendo angina. The patient has significant dyslipidemia and known diabetes and a history of hypertension.  Since last visit he has had no new cardiac symptoms. He has had some symptoms of GERD relieved by Pepcid. The patient has diabetes and recently has had some problems with intertriginous candidiasis. His most recent problem was acute herpes zoster and he was seen in the emergency room on 08/31/13. He was treated with antiviral agents. Since last visit he reports that his twin brother died.  Recently the patient has been having more problems with his short-term memory.  His sister insisted on driving him to the office today.   Current Outpatient Prescriptions  Medication Sig Dispense Refill  . aspirin 81 MG tablet Take 81 mg by mouth daily.      . benazepril (LOTENSIN) 20 MG tablet TAKE 1 TABLET BY MOUTH EVERY DAY. 30 tablet 5  . clopidogrel (PLAVIX) 75 MG tablet TAKE 1 TABLET BY MOUTH DAILY 90 tablet 3  . famotidine (PEPCID) 40 MG tablet TAKE 1 TABLET BY MOUTH EVERY DAY 90 tablet 0  . gabapentin (NEURONTIN) 300 MG capsule Take 1 capsule (300 mg total) by mouth at bedtime as needed. 30 capsule 3  . glipiZIDE (GLUCOTROL XL) 10 MG 24 hr tablet TAKE 1 TABLET BY MOUTH EVERY DAY 30 tablet 6  . isosorbide dinitrate (ISORDIL) 20 MG tablet TAKE 1 TABLET BY MOUTH THREE TIMES DAILY 90 tablet 5  . metFORMIN (GLUCOPHAGE) 500 MG tablet TAKE 1 TABLET BY MOUTH THREE TIMES DAILY  WITH MEALS 270 tablet 3  . metoprolol (LOPRESSOR) 50 MG tablet Take 0.5 tablets (25 mg total) by mouth 2 (two) times daily. 30 tablet 6  . Multiple Vitamins-Iron (MULTIVITAMIN/IRON PO) Take by mouth.    . nitroGLYCERIN (NITROSTAT) 0.4 MG SL tablet ONE TABLET UNDER TONGUE AS NEEDED FOR CHEST PAIN EVERY 5 MINUTES AS NEEDED FOR CHEST PAIN 25 tablet prn  . nystatin-triamcinolone (MYCOLOG II) cream Apply topically 2 (two) times daily as needed. 30 g 1  . rosuvastatin (CRESTOR) 10 MG tablet Take 0.5 tablets (5 mg total) by mouth daily. 45 tablet 3  . donepezil (ARICEPT) 5 MG tablet Take 1 tablet (5 mg total) by mouth at bedtime. 30 tablet 5  . [DISCONTINUED] benazepril-hydrochlorthiazide (LOTENSIN HCT) 20-12.5 MG per tablet Take 1 tablet by mouth daily. 30 tablet 11   No current facility-administered medications for this visit.    Allergies  Allergen Reactions  . Amlodipine     headache  . Lipitor [Atorvastatin Calcium]   . Lisinopril     Rash / cough. However patient is currently taking Lotensin with no problems    Patient Active Problem List   Diagnosis Date Noted  . Memory disorder 05/28/2014  . Postherpetic neuralgia 09/25/2013  . Intertriginous candidiasis 01/12/2013  . Iron deficiency anemia 09/15/2012  . Malaise and fatigue 11/16/2011  . Unstable angina pectoris 09/19/2011  . CKD (chronic  kidney disease) stage 3, GFR 30-59 ml/min 09/19/2011  . CAD (coronary artery disease) 01/21/2011  . Type II or unspecified type diabetes mellitus without mention of complication, uncontrolled 10/17/2010  . Hypercholesterolemia 10/17/2010  . Hx of CABG 10/17/2010  . Benign hypertensive heart disease without heart failure 10/17/2010  . Dyspepsia 10/17/2010    History  Smoking status  . Never Smoker   Smokeless tobacco  . Never Used    History  Alcohol Use No    Family History  Problem Relation Age of Onset  . Stroke Daughter     09/2008  . Heart disease Father   . Heart attack  Father   . Heart attack Brother   . Heart disease Mother     Review of Systems: Constitutional: no fever chills diaphoresis or fatigue or change in weight.  Head and neck: no hearing loss, no epistaxis, no photophobia or visual disturbance. Respiratory: No cough, shortness of breath or wheezing. Cardiovascular: No chest pain peripheral edema, palpitations. Gastrointestinal: No abdominal distention, no abdominal pain, no change in bowel habits hematochezia or melena. Genitourinary: No dysuria, no frequency, no urgency, no nocturia. Musculoskeletal:No arthralgias, no back pain, no gait disturbance or myalgias. Neurological: No dizziness, no headaches, no numbness, no seizures, no syncope, no weakness, no tremors. Hematologic: No lymphadenopathy, no easy bruising. Psychiatric: No confusion, no hallucinations, no sleep disturbance.    Physical Exam: Filed Vitals:   05/28/14 0839  BP: 148/80  Pulse: 59   the general appearance reveals a well-developed well-nourished gentleman in no distress.The head and neck exam reveals pupils equal and reactive.  Extraocular movements are full.  There is no scleral icterus.  The mouth and pharynx are normal.  The neck is supple.  The carotids reveal no bruits.  The jugular venous pressure is normal.  The  thyroid is not enlarged.  There is no lymphadenopathy.  The chest is clear to percussion and auscultation.  There are no rales or rhonchi.  Expansion of the chest is symmetrical.  The precordium is quiet.  The first heart sound is accentuated because of the short PR interval.  The second heart sound is physiologically split.  There is no murmur gallop rub or click.  There is no abnormal lift or heave.  The abdomen is soft and nontender.  The bowel sounds are normal.  The liver and spleen are not enlarged.  There are no abdominal masses.  There are no abdominal bruits.  Extremities reveal good pedal pulses.  There is no phlebitis or edema.  There is no cyanosis  or clubbing.  Strength is normal and symmetrical in all extremities.  There is no lateralizing weakness.  There are no sensory deficits.  The skin is warm and dry.  There is no rash.     Assessment / Plan: 1. ischemic heart disease status post CABG in 1981 and redo CABG in 2009.  Most recent cardiac catheter in March 2013 with a drug-eluting stent to the saphenous vein graft to the obtuse marginal. 2. hypertensive heart disease without heart failure 3. diabetes mellitus adult onset 4. Dyslipidemia 5.  Memory disorder. Disposition continue same medication.  Recheck in 4 months for office visit lipid panel hepatic function panel basal metabolic panel. Trial of Aricept 5 mg at at bedtime

## 2014-05-28 NOTE — Assessment & Plan Note (Signed)
The patient has not been experiencing any chest pain or angina. 

## 2014-06-06 ENCOUNTER — Telehealth: Payer: Self-pay | Admitting: *Deleted

## 2014-06-06 NOTE — Telephone Encounter (Signed)
New Message  Pt stated he has never received the flu shot and does not plan on taking one this year.

## 2014-06-14 ENCOUNTER — Encounter (HOSPITAL_COMMUNITY): Payer: Self-pay | Admitting: Cardiology

## 2014-06-15 ENCOUNTER — Other Ambulatory Visit: Payer: Self-pay | Admitting: Cardiology

## 2014-07-12 ENCOUNTER — Other Ambulatory Visit: Payer: Self-pay | Admitting: Cardiology

## 2014-07-24 DIAGNOSIS — H35353 Cystoid macular degeneration, bilateral: Secondary | ICD-10-CM | POA: Diagnosis not present

## 2014-07-24 DIAGNOSIS — E11321 Type 2 diabetes mellitus with mild nonproliferative diabetic retinopathy with macular edema: Secondary | ICD-10-CM | POA: Diagnosis not present

## 2014-07-31 DIAGNOSIS — E11321 Type 2 diabetes mellitus with mild nonproliferative diabetic retinopathy with macular edema: Secondary | ICD-10-CM | POA: Diagnosis not present

## 2014-08-08 ENCOUNTER — Other Ambulatory Visit: Payer: Self-pay | Admitting: Cardiology

## 2014-08-16 ENCOUNTER — Other Ambulatory Visit: Payer: Self-pay | Admitting: Cardiology

## 2014-09-04 DIAGNOSIS — E11321 Type 2 diabetes mellitus with mild nonproliferative diabetic retinopathy with macular edema: Secondary | ICD-10-CM | POA: Diagnosis not present

## 2014-09-06 DIAGNOSIS — E11321 Type 2 diabetes mellitus with mild nonproliferative diabetic retinopathy with macular edema: Secondary | ICD-10-CM | POA: Diagnosis not present

## 2014-09-10 ENCOUNTER — Other Ambulatory Visit: Payer: Self-pay | Admitting: Cardiology

## 2014-09-26 ENCOUNTER — Encounter: Payer: Self-pay | Admitting: Cardiology

## 2014-09-26 ENCOUNTER — Ambulatory Visit (INDEPENDENT_AMBULATORY_CARE_PROVIDER_SITE_OTHER): Payer: Medicare Other | Admitting: Cardiology

## 2014-09-26 VITALS — BP 134/72 | HR 64 | Ht 65.0 in | Wt 140.1 lb

## 2014-09-26 DIAGNOSIS — R413 Other amnesia: Secondary | ICD-10-CM | POA: Diagnosis not present

## 2014-09-26 DIAGNOSIS — I259 Chronic ischemic heart disease, unspecified: Secondary | ICD-10-CM

## 2014-09-26 DIAGNOSIS — I119 Hypertensive heart disease without heart failure: Secondary | ICD-10-CM | POA: Diagnosis not present

## 2014-09-26 DIAGNOSIS — E785 Hyperlipidemia, unspecified: Secondary | ICD-10-CM | POA: Diagnosis not present

## 2014-09-26 LAB — BASIC METABOLIC PANEL
BUN: 26 mg/dL — AB (ref 6–23)
CALCIUM: 9.5 mg/dL (ref 8.4–10.5)
CO2: 28 meq/L (ref 19–32)
CREATININE: 1.18 mg/dL (ref 0.40–1.50)
Chloride: 103 mEq/L (ref 96–112)
GFR: 62.54 mL/min (ref >60.00)
Glucose, Bld: 102 mg/dL — ABNORMAL HIGH (ref 70–99)
Potassium: 4 mEq/L (ref 3.5–5.1)
Sodium: 138 mEq/L (ref 135–145)

## 2014-09-26 NOTE — Patient Instructions (Signed)
Your physician recommends that you continue on your current medications as directed. Please refer to the Current Medication list given to you today.  Your physician wants you to follow-up in: 4 months with fasting labs (lp/bmet/hfp/A1C)  You will receive a reminder letter in the mail two months in advance. If you don't receive a letter, please call our office to schedule the follow-up appointment.  

## 2014-09-26 NOTE — Progress Notes (Signed)
Quick Note:  Please report to patient. The recent labs are stable. Continue same medication and careful diet. The blood sugar is better at 102. The kidneys are a little drier. Needs to drink plenty of water. ______

## 2014-09-26 NOTE — Progress Notes (Signed)
Cardiology Office Note   Date:  09/26/2014   ID:  Jerry PlentyRoy D Ghosh, DOB 03/21/1929, MRN [id number]  PCP:  Cassell Clementhomas Lovelace Cerveny, MD  Cardiologist:   Cassell Clementhomas Mariadejesus Cade, MD   No chief complaint on file.     History of Present Illness: Jerry Benson is a 81 y.o. male who presents for a four-month office follow-up  This pleasant 81 year old gentleman is seen for a scheduled followup office visit. He has a history of known ischemic heart disease. He had CABG in 1981 and a redo CABG by Dr. Laneta SimmersBartle in 2009. In March 2013 he underwent cardiac catheterization by Dr. Riley KillStuckey with a drug-eluting stent to the saphenous vein graft to the obtuse marginal because of crescendo angina. The patient has significant dyslipidemia and known diabetes and a history of hypertension.  Since last visit he has had no new cardiac symptoms. He has had some symptoms of GERD relieved by Pepcid. The patient has diabetes and recently has had some problems with intertriginous candidiasis. His most recent problem was acute herpes zoster and he was seen in the emergency room on 08/31/13. He was treated with antiviral agents. Since last visit he reports that his twin brother died. Recently the patient has been having more problems with his short-term memory. At his last visit we started him on generic Aricept.  He thinks that it may be helping somewhat. He has not had to take any recent sublingual nitroglycerin.  He is not having any symptoms of CHF.  He has noted a slight trembling of his hands with activity and states that this has run in his family.  Past Medical History  Diagnosis Date  . Coronary artery disease     a. Remote MI 1981;  b. Prior CABG per Dr. Hendricks MiloSewell Dixon in 1981;  c. redo CABG in 2009;  d. PCI to SVG to DX and OM in 2010;  e. PCI/DES VG-> OM (ISR) w/ 2.75x2122mm Resolute DES Mar. 2013  . Diabetes mellitus   . Hypertension   . Hyperlipidemia   . Chest pain   . GERD (gastroesophageal reflux disease)   . Status post  insertion of non-drug eluting coronary artery stent 10/05/2008  . CHF (congestive heart failure)   . CKD (chronic kidney disease) stage 3, GFR 30-59 ml/min     Past Surgical History  Procedure Laterality Date  . Coronary artery bypass graft      original surgery in in 1981 with redo surgery in 2009. His original surgery was by Dr. Hendricks MiloSewell Dixon and he had a SVG to the RCA, SVG to DX/OM, and SVG to DX/LAD; Redo surgery per Dr. Laneta SimmersBartle in 2009 with LIMA to LAD and SVG to PD  . Cardiac catheterization  10/11/2008    PCI to SVG to diagonal and OM in 2010  . Left heart catheterization with coronary/graft angiogram N/A 09/18/2011    Procedure: LEFT HEART CATHETERIZATION WITH Isabel CapriceORONARY/GRAFT ANGIOGRAM;  Surgeon: Herby Abrahamhomas D Stuckey, MD;  [facility];  Service: Cardiovascular;  Laterality: N/A;  . Percutaneous coronary stent intervention (pci-s) N/A 09/21/2011    Procedure: PERCUTANEOUS CORONARY STENT INTERVENTION (PCI-S);  Surgeon: Herby Abrahamhomas D Stuckey, MD;  [facility];  Service: Cardiovascular;  Laterality: N/A;     Current Outpatient Prescriptions  Medication Sig Dispense Refill  . aspirin 81 MG tablet Take 81 mg by mouth daily.      . benazepril (LOTENSIN) 20 MG tablet TAKE 1 TABLET BY MOUTH EVERY DAY 30 tablet 3  .  clopidogrel (PLAVIX) 75 MG tablet TAKE 1 TABLET BY MOUTH DAILY 90 tablet 3  . donepezil (ARICEPT) 5 MG tablet Take 1 tablet (5 mg total) by mouth at bedtime. 30 tablet 5  . famotidine (PEPCID) 40 MG tablet TAKE 1 TABLET BY MOUTH EVERY DAY 90 tablet 0  . gabapentin (NEURONTIN) 300 MG capsule Take 1 capsule (300 mg total) by mouth at bedtime as needed. 30 capsule 3  . glipiZIDE (GLUCOTROL XL) 10 MG 24 hr tablet TAKE 1 TABLET BY MOUTH EVERY DAY 30 tablet 6  . isosorbide dinitrate (ISORDIL) 20 MG tablet TAKE 1 TABLET BY MOUTH THREE TIMES DAILY. 90 tablet 0  . metFORMIN (GLUCOPHAGE) 500 MG tablet TAKE 1 TABLET BY MOUTH THREE TIMES DAILY WITH MEALS 270 tablet 3  . metoprolol  (LOPRESSOR) 50 MG tablet TAKE 1/2 TABLET BY MOUTH TWICE DAILY 30 tablet 3  . Multiple Vitamins-Iron (MULTIVITAMIN/IRON PO) Take by mouth.    . nitroGLYCERIN (NITROSTAT) 0.4 MG SL tablet ONE TABLET UNDER TONGUE AS NEEDED FOR CHEST PAIN EVERY 5 MINUTES AS NEEDED FOR CHEST PAIN 25 tablet prn  . nystatin-triamcinolone (MYCOLOG II) cream Apply topically 2 (two) times daily as needed. 30 g 1  . rosuvastatin (CRESTOR) 10 MG tablet Take 0.5 tablets (5 mg total) by mouth daily. 45 tablet 3  . [DISCONTINUED] benazepril-hydrochlorthiazide (LOTENSIN HCT) 20-12.5 MG per tablet Take 1 tablet by mouth daily. 30 tablet 11   No current facility-administered medications for this visit.    Allergies:   Amlodipine; Lipitor; and Lisinopril    Social History:  The patient  reports that he has never smoked. He has never used smokeless tobacco. He reports that he does not drink alcohol or use illicit drugs.   Family History:  The patient's family history includes Heart attack in his brother and father; Heart disease in his father and mother; Stroke in his daughter.    ROS:  Please see the history of present illness.   Otherwise, review of systems are positive for none.   All other systems are reviewed and negative.    PHYSICAL EXAM: VS:  BP 134/72 mmHg  Pulse 64  Ht  (1.651 m)  Wt 140 lb 1.9 oz (63.558 kg)  BMI 23.32 kg/m2 , BMI Body mass index is 23.32 kg/(m^2). GEN: Well nourished, well developed, in no acute distress HEENT: normal Neck: no JVD, carotid bruits, or masses Cardiac: RRR; no murmurs, rubs, or gallops,no edema  Respiratory:  clear to auscultation bilaterally, normal work of breathing GI: soft, nontender, nondistended, + BS MS: no deformity or atrophy Skin: warm and dry, no rash Neuro:  Strength and sensation are intact Psych: euthymic mood, full affect   EKG:  EKG is ordered today. The ekg ordered today demonstrates normal sinus rhythm at 64/m.  Occasional PVCs are  seen.   Recent Labs: 05/28/2014: ALT 17; BUN 18; Creatinine 1.1; Hemoglobin 13.3; Platelets 127.0 Repeated and verified X2.*; Potassium 4.4; Sodium 140    Lipid Panel    Component Value Date/Time   CHOL 150 05/28/2014 0959   TRIG 107.0 05/28/2014 0959   HDL 43.40 05/28/2014 0959   CHOLHDL 3 05/28/2014 0959   VLDL 21.4 05/28/2014 0959   LDLCALC 85 05/28/2014 0959      Wt Readings from Last 3 Encounters:  09/26/14 140 lb 1.9 oz (63.558 kg)  05/28/14 138 lb (62.596 kg)  01/24/14 136 lb (61.689 kg)       ASSESSMENT AND PLAN:  1. ischemic heart disease status  post CABG in 1981 and redo CABG in 2009. Most recent cardiac catheter in March 2013 with a drug-eluting stent to the saphenous vein graft to the obtuse marginal. 2. hypertensive heart disease without heart failure 3. diabetes mellitus adult onset 4. Dyslipidemia 5. Memory disorder. Disposition continue same medication. Recheck in 4 months for office visit lipid panel hepatic function panel basal metabolic panel. Continue Aricept 5 mg daily generic  Current medicines are reviewed at length with the patient today.  The patient does not have concerns regarding medicines.  The following changes have been made:  no change  Labs/ tests ordered today include:  No orders of the defined types were placed in this encounter.     Disposition: Stent on same medication.  Try to increase walking exercise if possible.  Recheck in 4 months for office visit lipid panel hepatic function panel basal metabolic panel and hemoglobin X0R.  He has not been having any hypoglycemic reactions   Signed, Cassell Clement, MD  09/26/2014 1:42 PM    [facility] [address] [place], [place], [place]  [address] Phone: [phone]; Fax: [phone]

## 2014-10-08 ENCOUNTER — Other Ambulatory Visit: Payer: Self-pay | Admitting: Cardiology

## 2014-10-09 DIAGNOSIS — E11321 Type 2 diabetes mellitus with mild nonproliferative diabetic retinopathy with macular edema: Secondary | ICD-10-CM | POA: Diagnosis not present

## 2014-10-11 DIAGNOSIS — E11321 Type 2 diabetes mellitus with mild nonproliferative diabetic retinopathy with macular edema: Secondary | ICD-10-CM | POA: Diagnosis not present

## 2014-10-12 ENCOUNTER — Other Ambulatory Visit: Payer: Self-pay | Admitting: Cardiology

## 2014-10-15 ENCOUNTER — Other Ambulatory Visit: Payer: Self-pay | Admitting: Cardiology

## 2014-11-06 DIAGNOSIS — E11321 Type 2 diabetes mellitus with mild nonproliferative diabetic retinopathy with macular edema: Secondary | ICD-10-CM | POA: Diagnosis not present

## 2014-11-08 DIAGNOSIS — E11321 Type 2 diabetes mellitus with mild nonproliferative diabetic retinopathy with macular edema: Secondary | ICD-10-CM | POA: Diagnosis not present

## 2014-11-09 ENCOUNTER — Other Ambulatory Visit: Payer: Self-pay | Admitting: Cardiology

## 2014-11-15 ENCOUNTER — Other Ambulatory Visit: Payer: Self-pay | Admitting: Cardiology

## 2014-11-21 ENCOUNTER — Encounter: Payer: Self-pay | Admitting: Cardiology

## 2014-12-04 DIAGNOSIS — E11321 Type 2 diabetes mellitus with mild nonproliferative diabetic retinopathy with macular edema: Secondary | ICD-10-CM | POA: Diagnosis not present

## 2014-12-04 DIAGNOSIS — H3562 Retinal hemorrhage, left eye: Secondary | ICD-10-CM | POA: Diagnosis not present

## 2014-12-06 DIAGNOSIS — E11321 Type 2 diabetes mellitus with mild nonproliferative diabetic retinopathy with macular edema: Secondary | ICD-10-CM | POA: Diagnosis not present

## 2014-12-13 DIAGNOSIS — H2512 Age-related nuclear cataract, left eye: Secondary | ICD-10-CM | POA: Diagnosis not present

## 2014-12-13 DIAGNOSIS — H25812 Combined forms of age-related cataract, left eye: Secondary | ICD-10-CM | POA: Diagnosis not present

## 2014-12-13 DIAGNOSIS — H25012 Cortical age-related cataract, left eye: Secondary | ICD-10-CM | POA: Diagnosis not present

## 2014-12-20 ENCOUNTER — Other Ambulatory Visit: Payer: Self-pay | Admitting: Cardiology

## 2014-12-27 NOTE — Addendum Note (Signed)
Addended by: Reesa Chew on: 12/27/2014 10:41 AM   Modules accepted: Orders

## 2015-01-15 ENCOUNTER — Encounter: Payer: Self-pay | Admitting: Cardiology

## 2015-01-15 ENCOUNTER — Ambulatory Visit (INDEPENDENT_AMBULATORY_CARE_PROVIDER_SITE_OTHER): Payer: Medicare Other | Admitting: Cardiology

## 2015-01-15 ENCOUNTER — Other Ambulatory Visit (INDEPENDENT_AMBULATORY_CARE_PROVIDER_SITE_OTHER): Payer: Medicare Other | Admitting: *Deleted

## 2015-01-15 VITALS — BP 139/80 | HR 55 | Ht 65.0 in | Wt 137.0 lb

## 2015-01-15 DIAGNOSIS — E1165 Type 2 diabetes mellitus with hyperglycemia: Secondary | ICD-10-CM

## 2015-01-15 DIAGNOSIS — I259 Chronic ischemic heart disease, unspecified: Secondary | ICD-10-CM

## 2015-01-15 DIAGNOSIS — R413 Other amnesia: Secondary | ICD-10-CM

## 2015-01-15 DIAGNOSIS — D509 Iron deficiency anemia, unspecified: Secondary | ICD-10-CM | POA: Diagnosis not present

## 2015-01-15 DIAGNOSIS — I119 Hypertensive heart disease without heart failure: Secondary | ICD-10-CM | POA: Diagnosis not present

## 2015-01-15 LAB — CBC WITH DIFFERENTIAL/PLATELET
BASOS PCT: 0.2 % (ref 0.0–3.0)
Basophils Absolute: 0 10*3/uL (ref 0.0–0.1)
EOS ABS: 0.2 10*3/uL (ref 0.0–0.7)
Eosinophils Relative: 3 % (ref 0.0–5.0)
HCT: 39.6 % (ref 39.0–52.0)
Hemoglobin: 13.1 g/dL (ref 13.0–17.0)
Lymphocytes Relative: 21.7 % (ref 12.0–46.0)
Lymphs Abs: 1.7 10*3/uL (ref 0.7–4.0)
MCHC: 33.2 g/dL (ref 30.0–36.0)
MCV: 90.3 fl (ref 78.0–100.0)
MONO ABS: 0.4 10*3/uL (ref 0.1–1.0)
Monocytes Relative: 5 % (ref 3.0–12.0)
Neutro Abs: 5.4 10*3/uL (ref 1.4–7.7)
Neutrophils Relative %: 70.1 % (ref 43.0–77.0)
Platelets: 128 10*3/uL — ABNORMAL LOW (ref 150.0–400.0)
RBC: 4.39 Mil/uL (ref 4.22–5.81)
RDW: 12.7 % (ref 11.5–15.5)
WBC: 7.8 10*3/uL (ref 4.0–10.5)

## 2015-01-15 LAB — HEPATIC FUNCTION PANEL
ALT: 13 U/L (ref 0–53)
AST: 20 U/L (ref 0–37)
Albumin: 4.3 g/dL (ref 3.5–5.2)
Alkaline Phosphatase: 56 U/L (ref 39–117)
BILIRUBIN TOTAL: 0.9 mg/dL (ref 0.2–1.2)
Bilirubin, Direct: 0.2 mg/dL (ref 0.0–0.3)
TOTAL PROTEIN: 6.9 g/dL (ref 6.0–8.3)

## 2015-01-15 LAB — LIPID PANEL
Cholesterol: 131 mg/dL (ref 0–200)
HDL: 41 mg/dL (ref >39.00)
LDL CALC: 65 mg/dL (ref 0–99)
NonHDL: 90
TRIGLYCERIDES: 126 mg/dL (ref 0.0–149.0)
Total CHOL/HDL Ratio: 3
VLDL: 25.2 mg/dL (ref 0.0–40.0)

## 2015-01-15 LAB — BASIC METABOLIC PANEL
BUN: 23 mg/dL (ref 6–23)
CALCIUM: 9.5 mg/dL (ref 8.4–10.5)
CO2: 29 meq/L (ref 19–32)
Chloride: 105 mEq/L (ref 96–112)
Creatinine, Ser: 1.02 mg/dL (ref 0.40–1.50)
GFR: 73.93 mL/min (ref >60.00)
GLUCOSE: 136 mg/dL — AB (ref 70–99)
Potassium: 4.6 mEq/L (ref 3.5–5.1)
Sodium: 140 mEq/L (ref 135–145)

## 2015-01-15 LAB — HEMOGLOBIN A1C: HEMOGLOBIN A1C: 6.7 % — AB (ref 4.6–6.5)

## 2015-01-15 MED ORDER — NITROGLYCERIN 0.4 MG SL SUBL: SUBLINGUAL_TABLET | SUBLINGUAL | Status: DC

## 2015-01-15 NOTE — Progress Notes (Signed)
Quick Note:  Please report to patient. The recent labs are stable. Continue same medication and careful diet. There was no anemia. The hemoglobin A1c is improved. ______

## 2015-01-15 NOTE — Patient Instructions (Signed)
Medication Instructions:  Your physician recommends that you continue on your current medications as directed. Please refer to the Current Medication list given to you today.  Labwork: Lp/bmet/hfp/cbc/a1c  Testing/Procedures: none  Follow-Up: Your physician wants you to follow-up in: 4 months with fasting labs (lp/bmet/hfp/cbc) and ekg  You will receive a reminder letter in the mail two months in advance. If you don't receive a letter, please call our office to schedule the follow-up appointment.

## 2015-01-15 NOTE — Progress Notes (Signed)
Cardiology Office Note   Date:  01/15/2015   ID:  Jerry Benson, DOB 08/05/1928, MRN [id number]  PCP:  Jerry Benson, Tom, MD  Cardiologist: Jerry Clementhomas Vira Chaplin MD  Chief Complaint  Patient presents with  . Irregular Heart Beat      History of Present Illness: Jerry Benson is a 81 y.o. male who presents for scheduled follow-up office visit  This pleasant 81 year old gentleman is seen for a scheduled followup office visit. He has a history of known ischemic heart disease. He had CABG in 1981 and a redo CABG by Dr. Laneta Benson in 2009. In March 2013 he underwent cardiac catheterization by Dr. Riley Benson with a drug-eluting stent to the saphenous vein graft to the obtuse marginal because of crescendo angina. The patient has significant dyslipidemia and known diabetes and a history of hypertension.  Since last visit he has had no new cardiac symptoms.  He continues to have occasional chest pain and takes sublingual nitroglycerin most days but not every day.  He has had some symptoms of GERD relieved by Pepcid. The patient has diabetes.  He has had occasional mild hypoglycemic episodes.. Recently the patient has been having more problems with his short-term memory. The patient is now on generic Aricept.  He thinks it may have helped some. The patient has had worsening vision.  He is no longer able to drive.  His sister brought him today. The patient has had decreased energy and feels tired.  About 2 weeks ago he had some dark stools.  We will check a CBC today.  Past Medical History  Diagnosis Date  . Coronary artery disease     a. Remote MI 1981;  b. Prior CABG per Dr. Hendricks MiloSewell Benson in 1981;  c. redo CABG in 2009;  d. PCI to SVG to DX and OM in 2010;  e. PCI/DES VG-> OM (ISR) w/ 2.75x4222mm Resolute DES Mar. 2013  . Diabetes mellitus   . Hypertension   . Hyperlipidemia   . Chest pain   . GERD (gastroesophageal reflux disease)   . Status post insertion of non-drug eluting coronary artery stent  10/05/2008  . CHF (congestive heart failure)   . CKD (chronic kidney disease) stage 3, GFR 30-59 ml/min     Past Surgical History  Procedure Laterality Date  . Coronary artery bypass graft      original surgery in in 1981 with redo surgery in 2009. His original surgery was by Dr. Hendricks MiloSewell Benson and he had a SVG to the RCA, SVG to DX/OM, and SVG to DX/LAD; Redo surgery per Dr. Laneta Benson in 2009 with LIMA to LAD and SVG to PD  . Cardiac catheterization  10/11/2008    PCI to SVG to diagonal and OM in 2010  . Left heart catheterization with coronary/graft angiogram N/A 09/18/2011    Procedure: LEFT HEART CATHETERIZATION WITH Isabel CapriceORONARY/GRAFT ANGIOGRAM;  Surgeon: Jerry Abrahamhomas D Stuckey, MD;  [facility];  Service: Cardiovascular;  Laterality: N/A;  . Percutaneous coronary stent intervention (pci-s) N/A 09/21/2011    Procedure: PERCUTANEOUS CORONARY STENT INTERVENTION (PCI-S);  Surgeon: Jerry Abrahamhomas D Stuckey, MD;  [facility];  Service: Cardiovascular;  Laterality: N/A;     Current Outpatient Prescriptions  Medication Sig Dispense Refill  . aspirin 81 MG tablet Take 81 mg by mouth daily.      . benazepril (LOTENSIN) 20 MG tablet TAKE 1 TABLET BY MOUTH EVERY DAY. 30 tablet 3  . clopidogrel (PLAVIX) 75 MG tablet TAKE 1 TABLET BY  MOUTH DAILY 90 tablet 1  . CRESTOR 10 MG tablet TAKE 1/2 TABLET BY MOUTH ONCE DAILY 45 tablet 0  . donepezil (ARICEPT) 5 MG tablet Take 1 tablet (5 mg total) by mouth at bedtime. 30 tablet 5  . DUREZOL 0.05 % EMUL PLACE 1 GTT OS TID  0  . famotidine (PEPCID) 40 MG tablet TAKE 1 TABLET BY MOUTH EVERY DAY. 90 tablet 0  . glipiZIDE (GLUCOTROL XL) 10 MG 24 hr tablet TAKE 1 TABLET BY MOUTH EVERY DAY 30 tablet 5  . isosorbide dinitrate (ISORDIL) 20 MG tablet TAKE 1 TABLET BY MOUTH THREE TIMES DAILY 90 tablet 1  . metFORMIN (GLUCOPHAGE) 500 MG tablet TAKE 1 TABLET BY MOUTH THREE TIMES DAILY WITH MEALS 270 tablet 3  . metoprolol (LOPRESSOR) 50 MG tablet TAKE 1/2 TABLET BY MOUTH  TWICE DAILY 90 tablet 3  . Multiple Vitamins-Iron (MULTIVITAMIN/IRON PO) Take by mouth.    . nitroGLYCERIN (NITROSTAT) 0.4 MG SL tablet PLACE 1 TABLET UNDER THE TONGUE EVERY 5 MINUTES AS NEEDED FOR CHEST PAIN 100 tablet prn  . [DISCONTINUED] benazepril-hydrochlorthiazide (LOTENSIN HCT) 20-12.5 MG per tablet Take 1 tablet by mouth daily. 30 tablet 11   No current facility-administered medications for this visit.    Allergies:   Amlodipine; Lipitor; and Lisinopril    Social History:  The patient  reports that he has never smoked. He has never used smokeless tobacco. He reports that he does not drink alcohol or use illicit drugs.   Family History:  The patient's family history includes Heart attack in his brother and father; Heart disease in his father and mother; Stroke in his daughter.    ROS:  Please see the history of present illness.   Otherwise, review of systems are positive for none.   All other systems are reviewed and negative.    PHYSICAL EXAM: VS:  BP 139/80 mmHg  Pulse 55  Ht  (1.651 m)  Wt 137 lb (62.143 kg)  BMI 22.80 kg/m2 , BMI Body mass index is 22.8 kg/(m^2). GEN: Well nourished, well developed, in no acute distress HEENT: normal Neck: no JVD, there is a right carotid bruit., or masses Cardiac: RRR; no murmurs, rubs, or gallops,no edema  Respiratory:  clear to auscultation bilaterally, normal work of breathing GI: soft, nontender, nondistended, + BS MS: no deformity or atrophy Skin: warm and dry, no rash Neuro:  Strength and sensation are intact Psych: euthymic mood, full affect   EKG:  EKG is not ordered today.    Recent Labs: 05/28/2014: ALT 17; Hemoglobin 13.3; Platelets 127.0 Repeated and verified X2.* 09/26/2014: BUN 26*; Creatinine, Ser 1.18; Potassium 4.0; Sodium 138    Lipid Panel    Component Value Date/Time   CHOL 150 05/28/2014 0959   TRIG 107.0 05/28/2014 0959   HDL 43.40 05/28/2014 0959   CHOLHDL 3 05/28/2014 0959   VLDL 21.4  05/28/2014 0959   LDLCALC 85 05/28/2014 0959      Wt Readings from Last 3 Encounters:  01/15/15 137 lb (62.143 kg)  09/26/14 140 lb 1.9 oz (63.558 kg)  05/28/14 138 lb (62.596 kg)         ASSESSMENT AND PLAN:  1. ischemic heart disease status post CABG in 1981 and redo CABG in 2009. Most recent cardiac catheter in March 2013 with a drug-eluting stent to the saphenous vein graft to the obtuse marginal.  Occasional angina pectoris which he describes as heartburn, relieved by sublingual nitroglycerin 2. hypertensive heart disease without heart  failure 3. diabetes mellitus adult onset 4. Dyslipidemia 5. Memory disorder.  Disposition: continue same medication. Recheck in 4 months for office visit, EKG, lipid panel hepatic function panel basal metabolic panel.  And CBC Continue Aricept 5 mg daily generic   Current medicines are reviewed at length with the patient today.  The patient does not have concerns regarding medicines.  The following changes have been made:  no change  Labs/ tests ordered today include:   Orders Placed This Encounter  Procedures  . Lipid panel  . Hepatic function panel  . Basic metabolic panel  . CBC with Differential/Platelet  . Hemoglobin A1c  . Lipid panel  . Hepatic function panel  . Basic metabolic panel  . CBC with Differential/Platelet    Disposition: Continue current medication.  Recheck in 4 months for office visit EKG CBC lipid panel hepatic function panel and basal metabolic panel  Signed, Jerry Clement MD 01/15/2015 2:37 PM    [facility] [address] [place], [place], [place]  [address] Phone: [phone]; Fax: [phone]

## 2015-01-16 ENCOUNTER — Other Ambulatory Visit: Payer: Self-pay | Admitting: Cardiology

## 2015-01-21 ENCOUNTER — Other Ambulatory Visit: Payer: Self-pay | Admitting: Cardiology

## 2015-01-22 DIAGNOSIS — E11321 Type 2 diabetes mellitus with mild nonproliferative diabetic retinopathy with macular edema: Secondary | ICD-10-CM | POA: Diagnosis not present

## 2015-01-24 DIAGNOSIS — E11321 Type 2 diabetes mellitus with mild nonproliferative diabetic retinopathy with macular edema: Secondary | ICD-10-CM | POA: Diagnosis not present

## 2015-01-30 ENCOUNTER — Encounter (HOSPITAL_COMMUNITY): Payer: Self-pay | Admitting: Emergency Medicine

## 2015-01-30 ENCOUNTER — Emergency Department (HOSPITAL_COMMUNITY)
Admission: EM | Admit: 2015-01-30 | Discharge: 2015-01-30 | Disposition: A | Discharge status: Home / Self Care | Payer: Medicare Other | Attending: Emergency Medicine | Admitting: Emergency Medicine

## 2015-01-30 DIAGNOSIS — E785 Hyperlipidemia, unspecified: Secondary | ICD-10-CM | POA: Diagnosis not present

## 2015-01-30 DIAGNOSIS — Y9289 Other specified places as the place of occurrence of the external cause: Secondary | ICD-10-CM | POA: Insufficient documentation

## 2015-01-30 DIAGNOSIS — E119 Type 2 diabetes mellitus without complications: Secondary | ICD-10-CM | POA: Insufficient documentation

## 2015-01-30 DIAGNOSIS — I252 Old myocardial infarction: Secondary | ICD-10-CM | POA: Diagnosis not present

## 2015-01-30 DIAGNOSIS — N183 Chronic kidney disease, stage 3 (moderate): Secondary | ICD-10-CM | POA: Insufficient documentation

## 2015-01-30 DIAGNOSIS — Z7982 Long term (current) use of aspirin: Secondary | ICD-10-CM | POA: Diagnosis not present

## 2015-01-30 DIAGNOSIS — Y9389 Activity, other specified: Secondary | ICD-10-CM | POA: Diagnosis not present

## 2015-01-30 DIAGNOSIS — Z951 Presence of aortocoronary bypass graft: Secondary | ICD-10-CM | POA: Diagnosis not present

## 2015-01-30 DIAGNOSIS — K222 Esophageal obstruction: Secondary | ICD-10-CM | POA: Diagnosis not present

## 2015-01-30 DIAGNOSIS — Y998 Other external cause status: Secondary | ICD-10-CM | POA: Diagnosis not present

## 2015-01-30 DIAGNOSIS — T18128A Food in esophagus causing other injury, initial encounter: Secondary | ICD-10-CM

## 2015-01-30 DIAGNOSIS — K219 Gastro-esophageal reflux disease without esophagitis: Secondary | ICD-10-CM | POA: Diagnosis not present

## 2015-01-30 DIAGNOSIS — Z7902 Long term (current) use of antithrombotics/antiplatelets: Secondary | ICD-10-CM | POA: Diagnosis not present

## 2015-01-30 DIAGNOSIS — I129 Hypertensive chronic kidney disease with stage 1 through stage 4 chronic kidney disease, or unspecified chronic kidney disease: Secondary | ICD-10-CM | POA: Insufficient documentation

## 2015-01-30 DIAGNOSIS — X58XXXA Exposure to other specified factors, initial encounter: Secondary | ICD-10-CM | POA: Insufficient documentation

## 2015-01-30 DIAGNOSIS — I2510 Atherosclerotic heart disease of native coronary artery without angina pectoris: Secondary | ICD-10-CM | POA: Insufficient documentation

## 2015-01-30 DIAGNOSIS — Z9861 Coronary angioplasty status: Secondary | ICD-10-CM | POA: Insufficient documentation

## 2015-01-30 DIAGNOSIS — Z9889 Other specified postprocedural states: Secondary | ICD-10-CM | POA: Insufficient documentation

## 2015-01-30 DIAGNOSIS — Z79899 Other long term (current) drug therapy: Secondary | ICD-10-CM | POA: Insufficient documentation

## 2015-01-30 DIAGNOSIS — I509 Heart failure, unspecified: Secondary | ICD-10-CM | POA: Diagnosis not present

## 2015-01-30 DIAGNOSIS — T17390A Other foreign object in larynx causing asphyxiation, initial encounter: Secondary | ICD-10-CM | POA: Diagnosis not present

## 2015-01-30 MED ORDER — GLUCAGON HCL RDNA (DIAGNOSTIC) 1 MG IJ SOLR
1.0000 mg | Freq: Once | INTRAMUSCULAR | Status: AC
  Administered 2015-01-30: 1 mg via INTRAVENOUS
  Filled 2015-01-30: qty 1

## 2015-01-30 MED ORDER — SODIUM CHLORIDE 0.9 % IV BOLUS (SEPSIS)
500.0000 mL | Freq: Once | INTRAVENOUS | Status: AC
  Administered 2015-01-30: 500 mL via INTRAVENOUS

## 2015-01-30 MED ORDER — NITROGLYCERIN 0.4 MG SL SUBL
0.4000 mg | SUBLINGUAL_TABLET | Freq: Once | SUBLINGUAL | Status: AC
  Administered 2015-01-30: 0.4 mg via SUBLINGUAL
  Filled 2015-01-30: qty 1

## 2015-01-30 NOTE — Discharge Instructions (Signed)
Esophageal Stricture °The esophagus is the long, narrow tube which carries food and liquid from the mouth to the stomach. Sometimes a part of the esophagus becomes narrow and makes it difficult, painful, or even impossible to swallow. This is called an esophageal stricture.  °CAUSES  °Common causes of blockage or strictures of the esophagus are: °· Exposure of the lower esophagus to the acid from the stomach may cause narrowing. °· Hiatal hernia in which a small part of the stomach bulges up through the diaphragm can cause a narrowing in the bottom of the esophagus. °· Scleroderma is a tissue disorder that affects the esophagus and makes swallowing difficult. °· Achalasia is an absence of nerves in the lower esophagus and to the esophageal sphincter. This absence of nerves may be congenital (present since birth). This can cause irregular spasms which do not allow food and fluid through. °· Strictures may develop from swallowing materials which damage the esophagus. Examples are acids or alkalis such as lye. °· Schatzki's Ring is a narrow ring of non-cancerous tissue which narrows the lower esophagus. The cause of this is unknown. °· Growths can block the esophagus. °SYMPTOMS  °Some of the problems are difficulty swallowing or pain with swallowing. °DIAGNOSIS  °Your caregiver often suspects this problem by taking a medical history. They will also do a physical exam. They may then take X-rays and/or perform an endoscopy. Endoscopy is an exam in which a tube like a small flexible telescope is used to look at your esophagus.  °TREATMENT °· One form of treatment is to dilate the narrow area. This means to stretch it. °· When this is not successful, chest surgery may be required. This is a much more extensive form of treatment with a longer recovery time. °Both of the above treatments make the passage of food and water into the stomach easier. They also make it easier for stomach contents to bubble back into the  esophagus. Special medications may be used following the procedure to help prevent further narrowing. Medications may be used to lower the amount of acid in the stomach juice.  °SEEK IMMEDIATE MEDICAL CARE IF:  °· Your swallowing is becoming more painful, difficult, or you are unable to swallow. °· You vomit up blood. °· You develop black tarry stools. °· You develop chills. °· You have a fever. °· You develop chest or abdominal pain. °· You develop shortness of breath, feel lightheaded, or faint. °Follow up with medical care as your caregiver suggests. °Document Released: 03/02/2006 Document Revised: 09/14/2011 Document Reviewed: 11/01/2013 °ExitCare® Patient Information ©2015 [employer]. This information is not intended to replace advice given to you by your health care provider. Make sure you discuss any questions you have with your health care provider. ° °

## 2015-01-30 NOTE — ED Notes (Signed)
EDP at bedside  

## 2015-01-30 NOTE — ED Provider Notes (Signed)
CSN: [id number]     Arrival date & time 01/30/15  1904 History   First MD Initiated Contact with Patient 01/30/15 1904     No chief complaint on file.     The history is provided by the patient.   Patient reports he has food stuck in his esophagus.  He ate a bite of bread and he is unable to swallow this.  Is now spitting his secretions into a emesis basin.  He states over the past several years he is intermittently had issues swallowing certain foods.  He states they've always passed on his own.  He has no prior history of upper endoscopy.  He states he's had a history of GERD.  No history of cancer. Patient reports his symptoms are moderate in severity  Past Medical History  Diagnosis Date  . Coronary artery disease     a. Remote MI 1981;  b. Prior CABG per Dr. Hendricks Milo in 1981;  c. redo CABG in 2009;  d. PCI to SVG to DX and OM in 2010;  e. PCI/DES VG-> OM (ISR) w/ 2.75x72mm Resolute DES Mar. 2013  . Diabetes mellitus   . Hypertension   . Hyperlipidemia   . Chest pain   . GERD (gastroesophageal reflux disease)   . Status post insertion of non-drug eluting coronary artery stent 10/05/2008  . CHF (congestive heart failure)   . CKD (chronic kidney disease) stage 3, GFR 30-59 ml/min    Past Surgical History  Procedure Laterality Date  . Coronary artery bypass graft      original surgery in in 1981 with redo surgery in 2009. His original surgery was by Dr. Hendricks Milo and he had a SVG to the RCA, SVG to DX/OM, and SVG to DX/LAD; Redo surgery per Dr. Laneta Simmers in 2009 with LIMA to LAD and SVG to PD  . Cardiac catheterization  10/11/2008    PCI to SVG to diagonal and OM in 2010  . Left heart catheterization with coronary/graft angiogram N/A 09/18/2011    Procedure: LEFT HEART CATHETERIZATION WITH Isabel Caprice;  Surgeon: Herby Abraham, MD;  Location: [facility] CATH LAB;  Service: Cardiovascular;  Laterality: N/A;  . Percutaneous coronary stent intervention (pci-s) N/A 09/21/2011     Procedure: PERCUTANEOUS CORONARY STENT INTERVENTION (PCI-S);  Surgeon: Herby Abraham, MD;  Location: [facility] CATH LAB;  Service: Cardiovascular;  Laterality: N/A;   Family History  Problem Relation Age of Onset  . Stroke Daughter     09/2008  . Heart disease Father   . Heart attack Father   . Heart attack Brother   . Heart disease Mother    History  Substance Use Topics  . Smoking status: Never Smoker   . Smokeless tobacco: Never Used  . Alcohol Use: No    Review of Systems  All other systems reviewed and are negative.     Allergies  Amlodipine; Lipitor; and Lisinopril  Home Medications   Prior to Admission medications   Medication Sig Start Date End Date Taking? Authorizing Provider  aspirin 81 MG tablet Take 81 mg by mouth daily.      Historical Provider, MD  benazepril (LOTENSIN) 20 MG tablet TAKE 1 TABLET BY MOUTH EVERY DAY. 11/09/14   Cassell Clement, MD  clopidogrel (PLAVIX) 75 MG tablet TAKE 1 TABLET BY MOUTH DAILY 10/09/14   Cassell Clement, MD  donepezil (ARICEPT) 5 MG tablet Take 1 tablet (5 mg total) by mouth at bedtime. 05/28/14   Cassell Clement, MD  DUREZOL 0.05 % EMUL PLACE 1 GTT OS TID 12/04/14   Historical Provider, MD  famotidine (PEPCID) 40 MG tablet TAKE 1 TABLET BY MOUTH EVERY DAY. 10/12/14   Cassell Clement, MD  glipiZIDE (GLUCOTROL XL) 10 MG 24 hr tablet TAKE 1 TABLET BY MOUTH EVERY DAY 10/12/14   Cassell Clement, MD  isosorbide dinitrate (ISORDIL) 20 MG tablet TAKE 1 TABLET BY MOUTH THREE TIMES DAILY 01/21/15   Cassell Clement, MD  metFORMIN (GLUCOPHAGE) 500 MG tablet TAKE 1 TABLET BY MOUTH THREE TIMES DAILY WITH MEALS 03/29/14   Cassell Clement, MD  metoprolol (LOPRESSOR) 50 MG tablet TAKE 1/2 TABLET BY MOUTH TWICE DAILY 12/21/14   Cassell Clement, MD  Multiple Vitamins-Iron (MULTIVITAMIN/IRON PO) Take by mouth.    Historical Provider, MD  nitroGLYCERIN (NITROSTAT) 0.4 MG SL tablet PLACE 1 TABLET UNDER THE TONGUE EVERY 5 MINUTES AS NEEDED FOR CHEST  PAIN 01/15/15   Cassell Clement, MD  rosuvastatin (CRESTOR) 10 MG tablet TAKE 1/2 TABLET BY MOUTH EVERY DAY 01/17/15   Cassell Clement, MD   SpO2 98% Physical Exam  Constitutional: He is oriented to person, place, and time. He appears well-developed and well-nourished.  HENT:  Head: Normocephalic and atraumatic.  Spitting, unable to keep secretions down  Eyes: EOM are normal.  Neck: Normal range of motion.  Cardiovascular: Normal rate, regular rhythm, normal heart sounds and intact distal pulses.   Pulmonary/Chest: Effort normal and breath sounds normal. No respiratory distress.  Abdominal: Soft. He exhibits no distension. There is no tenderness.  Musculoskeletal: Normal range of motion.  Neurological: He is alert and oriented to person, place, and time.  Skin: Skin is warm and dry.  Psychiatric: He has a normal mood and affect. Judgment normal.  Nursing note and vitals reviewed.   ED Course  Procedures (including critical care time) Labs Review Labs Reviewed - No data to display  Imaging Review No results found.   EKG Interpretation None      MDM   Final diagnoses:  None    We'll attempt glucagon and sublingual nitroglycerin.  500 cc bolus at this time.  If this does not resolve on its own patient will need endoscopy tonight to person the food bolus through.  He will need esophageal dilation in the future.  8:24 PM Patient was able to pass the food bolus on his own after glucagon and sublingual nitroglycerin.  The patient feels much better.  Is able to drink an entire cup at this time.  The patient was instructed NA.  Diet only.  He understands that he needs to follow-up with the gastroenterologist for likely esophageal dilation.  Azalia Bilis, MD 01/30/15 2025

## 2015-01-30 NOTE — ED Notes (Signed)
Pt in EMS. Eating bread 1hr ago, choked on bread and feels like it has been stuck in throat ever since. Pt presents with emesis and hiccups. Vitals stable en route.

## 2015-02-01 ENCOUNTER — Other Ambulatory Visit: Payer: Medicare Other

## 2015-02-01 ENCOUNTER — Ambulatory Visit: Payer: Medicare Other | Admitting: Cardiology

## 2015-02-19 DIAGNOSIS — E11321 Type 2 diabetes mellitus with mild nonproliferative diabetic retinopathy with macular edema: Secondary | ICD-10-CM | POA: Diagnosis not present

## 2015-03-05 DIAGNOSIS — E11321 Type 2 diabetes mellitus with mild nonproliferative diabetic retinopathy with macular edema: Secondary | ICD-10-CM | POA: Diagnosis not present

## 2015-03-13 ENCOUNTER — Other Ambulatory Visit: Payer: Self-pay | Admitting: Cardiology

## 2015-03-19 DIAGNOSIS — E11321 Type 2 diabetes mellitus with mild nonproliferative diabetic retinopathy with macular edema: Secondary | ICD-10-CM | POA: Diagnosis not present

## 2015-04-13 ENCOUNTER — Other Ambulatory Visit: Payer: Self-pay | Admitting: Cardiology

## 2015-04-15 ENCOUNTER — Other Ambulatory Visit: Payer: Self-pay | Admitting: *Deleted

## 2015-04-15 MED ORDER — GLIPIZIDE ER 10 MG PO TB24: 10.0000 mg | ORAL_TABLET | Freq: Every day | ORAL | Status: DC

## 2015-04-15 NOTE — Telephone Encounter (Signed)
Refilled Glipizide as requested by pharmacy

## 2015-04-23 DIAGNOSIS — E113211 Type 2 diabetes mellitus with mild nonproliferative diabetic retinopathy with macular edema, right eye: Secondary | ICD-10-CM | POA: Diagnosis not present

## 2015-04-29 ENCOUNTER — Other Ambulatory Visit: Payer: Self-pay | Admitting: Cardiology

## 2015-04-29 DIAGNOSIS — E1165 Type 2 diabetes mellitus with hyperglycemia: Secondary | ICD-10-CM

## 2015-05-16 ENCOUNTER — Other Ambulatory Visit: Payer: Self-pay | Admitting: Cardiology

## 2015-05-21 ENCOUNTER — Encounter: Payer: Self-pay | Admitting: Cardiology

## 2015-05-27 DIAGNOSIS — H109 Unspecified conjunctivitis: Secondary | ICD-10-CM | POA: Diagnosis not present

## 2015-05-27 DIAGNOSIS — J019 Acute sinusitis, unspecified: Secondary | ICD-10-CM | POA: Diagnosis not present

## 2015-05-28 DIAGNOSIS — E113211 Type 2 diabetes mellitus with mild nonproliferative diabetic retinopathy with macular edema, right eye: Secondary | ICD-10-CM | POA: Diagnosis not present

## 2015-06-03 ENCOUNTER — Ambulatory Visit: Payer: Self-pay | Admitting: Cardiology

## 2015-06-06 DIAGNOSIS — H2511 Age-related nuclear cataract, right eye: Secondary | ICD-10-CM | POA: Diagnosis not present

## 2015-06-06 DIAGNOSIS — H25811 Combined forms of age-related cataract, right eye: Secondary | ICD-10-CM | POA: Diagnosis not present

## 2015-06-10 ENCOUNTER — Ambulatory Visit: Payer: Self-pay | Admitting: Cardiology

## 2015-06-11 ENCOUNTER — Other Ambulatory Visit: Payer: Self-pay | Admitting: Cardiology

## 2015-06-25 DIAGNOSIS — E113211 Type 2 diabetes mellitus with mild nonproliferative diabetic retinopathy with macular edema, right eye: Secondary | ICD-10-CM | POA: Diagnosis not present

## 2015-07-11 ENCOUNTER — Encounter: Payer: Self-pay | Admitting: Cardiology

## 2015-07-11 ENCOUNTER — Ambulatory Visit (INDEPENDENT_AMBULATORY_CARE_PROVIDER_SITE_OTHER): Payer: Medicare Other | Admitting: Cardiology

## 2015-07-11 VITALS — BP 160/94 | HR 74 | Ht 65.0 in | Wt 138.1 lb

## 2015-07-11 DIAGNOSIS — I259 Chronic ischemic heart disease, unspecified: Secondary | ICD-10-CM | POA: Diagnosis not present

## 2015-07-11 DIAGNOSIS — G459 Transient cerebral ischemic attack, unspecified: Secondary | ICD-10-CM

## 2015-07-11 DIAGNOSIS — R413 Other amnesia: Secondary | ICD-10-CM | POA: Diagnosis not present

## 2015-07-11 DIAGNOSIS — E785 Hyperlipidemia, unspecified: Secondary | ICD-10-CM | POA: Diagnosis not present

## 2015-07-11 NOTE — Progress Notes (Signed)
Cardiology Office Note   Date:  07/11/2015   ID:  Jerry Benson, DOB 10/05/1928, MRN [id number]  PCP:  Ronny FlurryBrackbill, Tom, MD  Cardiologist: Cassell Clementhomas Dayna Geurts MD  Chief Complaint  Patient presents with  . Follow-up    denies cp/sob/le edema      History of Present Illness: Jerry Benson is a 82 y.o. male who presents for scheduled follow-up visit   He has a history of known ischemic heart disease. He had CABG in 1981 and a redo CABG by Dr. Laneta SimmersBartle in 2009. In March 2013 he underwent cardiac catheterization by Dr. Riley KillStuckey with a drug-eluting stent to the saphenous vein graft to the obtuse marginal because of crescendo angina. The patient has significant dyslipidemia and known diabetes and a history of hypertension.  Since last visit he has had no new cardiac symptoms. He continues to have occasional chest pain and takes sublingual nitroglycerin most days but not every day. He has had some symptoms of GERD relieved by Pepcid. The patient has diabetes. He has had occasional mild hypoglycemic episodes.. Recently the patient has been having more problems with his short-term memory. The patient is now on generic Aricept. He thinks it may have helped some. The patient has had worsening vision. He is no longer able to drive. His sister brought him today. Several weeks ago he thinks that he may have had a slight TIA.  He had some transient difficulty speaking and there was also some transient numbness of the right hand.  It lasted just a few minutes.  The patient is already on dual antiplatelet therapy with aspirin and clopidogrel.  We have not done carotid Dopplers recently. His EKG shows normal sinus rhythm.  There is no history of atrial fibrillation. Past Medical History  Diagnosis Date  . Coronary artery disease     a. Remote MI 1981;  b. Prior CABG per Dr. Hendricks MiloSewell Dixon in 1981;  c. redo CABG in 2009;  d. PCI to SVG to DX and OM in 2010;  e. PCI/DES VG-> OM (ISR) w/ 2.75x6122mm Resolute DES  Mar. 2013  . Diabetes mellitus   . Hypertension   . Hyperlipidemia   . Chest pain   . GERD (gastroesophageal reflux disease)   . Status post insertion of non-drug eluting coronary artery stent 10/05/2008  . CHF (congestive heart failure) (HCC)   . CKD (chronic kidney disease) stage 3, GFR 30-59 ml/min     Past Surgical History  Procedure Laterality Date  . Coronary artery bypass graft      original surgery in in 1981 with redo surgery in 2009. His original surgery was by Dr. Hendricks MiloSewell Dixon and he had a SVG to the RCA, SVG to DX/OM, and SVG to DX/LAD; Redo surgery per Dr. Laneta SimmersBartle in 2009 with LIMA to LAD and SVG to PD  . Cardiac catheterization  10/11/2008    PCI to SVG to diagonal and OM in 2010  . Left heart catheterization with coronary/graft angiogram N/A 09/18/2011    Procedure: LEFT HEART CATHETERIZATION WITH Isabel CapriceORONARY/GRAFT ANGIOGRAM;  Surgeon: Herby Abrahamhomas D Stuckey, MD;  [facility];  Service: Cardiovascular;  Laterality: N/A;  . Percutaneous coronary stent intervention (pci-s) N/A 09/21/2011    Procedure: PERCUTANEOUS CORONARY STENT INTERVENTION (PCI-S);  Surgeon: Herby Abrahamhomas D Stuckey, MD;  [facility];  Service: Cardiovascular;  Laterality: N/A;     Current Outpatient Prescriptions  Medication Sig Dispense Refill  . aspirin 81 MG tablet Take 81 mg by mouth  daily.      . benazepril (LOTENSIN) 20 MG tablet TAKE 1 TABLET BY MOUTH EVERY DAY 30 tablet 5  . clopidogrel (PLAVIX) 75 MG tablet TAKE 1 TABLET BY MOUTH DAILY 90 tablet 0  . donepezil (ARICEPT) 5 MG tablet Take 1 tablet (5 mg total) by mouth at bedtime. 30 tablet 5  . famotidine (PEPCID) 40 MG tablet TAKE 1 TABLET BY MOUTH EVERY DAY. 90 tablet 0  . glipiZIDE (GLUCOTROL XL) 10 MG 24 hr tablet Take 1 tablet (10 mg total) by mouth daily. 30 tablet 5  . isosorbide dinitrate (ISORDIL) 20 MG tablet TAKE 1 TABLET BY MOUTH THREE TIMES DAILY 90 tablet 1  . metFORMIN (GLUCOPHAGE) 500 MG tablet TAKE 1 TABLET BY MOUTH THREE  TIMES DAILY WITH MEALS 270 tablet 0  . metoprolol (LOPRESSOR) 50 MG tablet TAKE 1/2 TABLET BY MOUTH TWICE DAILY 90 tablet 3  . Multiple Vitamins-Iron (MULTIVITAMIN/IRON PO) Take 1 tablet by mouth daily.     . nitroGLYCERIN (NITROSTAT) 0.4 MG SL tablet PLACE 1 TABLET UNDER THE TONGUE EVERY 5 MINUTES AS NEEDED FOR CHEST PAIN 100 tablet prn  . rosuvastatin (CRESTOR) 10 MG tablet TAKE 1/2 TABLET BY MOUTH EVERY DAY 45 tablet 1  . [DISCONTINUED] benazepril-hydrochlorthiazide (LOTENSIN HCT) 20-12.5 MG per tablet Take 1 tablet by mouth daily. 30 tablet 11   No current facility-administered medications for this visit.    Allergies:   Amlodipine; Lipitor; and Lisinopril    Social History:  The patient  reports that he has never smoked. He has never used smokeless tobacco. He reports that he does not drink alcohol or use illicit drugs.   Family History:  The patient's family history includes Heart attack in his brother and father; Heart disease in his father and mother; Stroke in his daughter.    ROS:  Please see the history of present illness.   Otherwise, review of systems are positive for none.   All other systems are reviewed and negative.    PHYSICAL EXAM: VS:  BP 160/94 mmHg  Pulse 74  Ht 5\' 5"  (1.651 m)  Wt 138 lb 1.9 oz (62.651 kg)  BMI 22.98 kg/m2 , BMI Body mass index is 22.98 kg/(m^2). GEN: Well nourished, well developed, in no acute distress HEENT: normal Neck: no JVD, carotid bruits, or masses Cardiac: RRR; no murmurs, rubs, or gallops,no edema  Respiratory:  clear to auscultation bilaterally, normal work of breathing GI: soft, nontender, nondistended, + BS MS: no deformity or atrophy Skin: warm and dry, no rash Neuro:  Strength and sensation are intact Psych: euthymic mood, full affect   EKG:  EKG is ordered today. The ekg ordered today demonstrates normal sinus rhythm at 73 bpm with a short PR interval.  There is left ventricular hypertrophy with RS widening and strain  pattern there is pattern of lateral infarct with Q waves in 1 and aVL.  Since previous tracing of 09/26/14, no significant change.   Recent Labs: 01/15/2015: ALT 13; BUN 23; Creatinine, Ser 1.02; Hemoglobin 13.1; Platelets 128.0*; Potassium 4.6; Sodium 140    Lipid Panel    Component Value Date/Time   CHOL 131 01/15/2015 1233   TRIG 126.0 01/15/2015 1233   HDL 41.00 01/15/2015 1233   CHOLHDL 3 01/15/2015 1233   VLDL 25.2 01/15/2015 1233   LDLCALC 65 01/15/2015 1233      Wt Readings from Last 3 Encounters:  07/11/15 138 lb 1.9 oz (62.651 kg)  01/15/15 137 lb (62.143 kg)  09/26/14 140  lb 1.9 oz (63.558 kg)       ASSESSMENT AND PLAN:  1. ischemic heart disease status post CABG in 1981 and redo CABG in 2009. Most recent cardiac catheter in March 2013 with a drug-eluting stent to the saphenous vein graft to the obtuse marginal. Occasional angina pectoris which he describes as heartburn, relieved by sublingual nitroglycerin although sometimes he takes famotidine for it as well. 2. hypertensive heart disease without heart failure 3. diabetes mellitus adult onset 4. Dyslipidemia 5. Memory disorder. 6.  Possible recent TIA.  I do not hear any carotid bruits.   Current medicines are reviewed at length with the patient today.  The patient does not have concerns regarding medicines.  The following changes have been made:  no change  Labs/ tests ordered today include:   Orders Placed This Encounter  Procedures  . EKG 12-Lead   Disposition: He will return soon for carotid Doppler ultrasound.  Continue with dual antiplatelet therapy.  Continue other medicines as is.  Recheck in 4 months for office visit with Dr. Eldridge Dace   Signed, Cassell Clement MD 07/11/2015 4:52 PM    [facility] [address] [place], [place], [place]  [address] Phone: [phone]; Fax: [phone]

## 2015-07-11 NOTE — Patient Instructions (Signed)
Medication Instructions:  Your physician recommends that you continue on your current medications as directed. Please refer to the Current Medication list given to you today.  Labwork: none  Testing/Procedures: Your physician has requested that you have a carotid duplex. This test is an ultrasound of the carotid arteries in your neck. It looks at blood flow through these arteries that supply the brain with blood. Allow one hour for this exam. There are no restrictions or special instructions. [facility]   Follow-Up: Your physician wants you to follow-up in: 4 MONTH OV WITH DR Eldridge DaceVARANASI  You will receive a reminder letter in the mail two months in advance. If you don't receive a letter, please call our office to schedule the follow-up appointment.  If you need a refill on your cardiac medications before your next appointment, please call your pharmacy.

## 2015-07-16 ENCOUNTER — Ambulatory Visit (HOSPITAL_COMMUNITY): Admission: RE | Admit: 2015-07-16 | Payer: Medicare Other | Source: Ambulatory Visit

## 2015-07-17 ENCOUNTER — Other Ambulatory Visit: Payer: Self-pay | Admitting: Cardiology

## 2015-07-23 ENCOUNTER — Ambulatory Visit (HOSPITAL_COMMUNITY)
Admission: RE | Admit: 2015-07-23 | Discharge: 2015-07-23 | Disposition: A | Discharge status: Home / Self Care | Payer: Medicare Other | Source: Ambulatory Visit | Attending: Cardiology | Admitting: Cardiology

## 2015-07-23 DIAGNOSIS — I6523 Occlusion and stenosis of bilateral carotid arteries: Secondary | ICD-10-CM | POA: Diagnosis not present

## 2015-07-23 DIAGNOSIS — I2510 Atherosclerotic heart disease of native coronary artery without angina pectoris: Secondary | ICD-10-CM | POA: Insufficient documentation

## 2015-07-23 DIAGNOSIS — G459 Transient cerebral ischemic attack, unspecified: Secondary | ICD-10-CM | POA: Insufficient documentation

## 2015-07-23 DIAGNOSIS — E785 Hyperlipidemia, unspecified: Secondary | ICD-10-CM | POA: Insufficient documentation

## 2015-07-23 DIAGNOSIS — R269 Unspecified abnormalities of gait and mobility: Secondary | ICD-10-CM | POA: Diagnosis not present

## 2015-07-23 DIAGNOSIS — I10 Essential (primary) hypertension: Secondary | ICD-10-CM | POA: Insufficient documentation

## 2015-07-23 DIAGNOSIS — R209 Unspecified disturbances of skin sensation: Secondary | ICD-10-CM | POA: Diagnosis not present

## 2015-07-30 DIAGNOSIS — E113212 Type 2 diabetes mellitus with mild nonproliferative diabetic retinopathy with macular edema, left eye: Secondary | ICD-10-CM | POA: Diagnosis not present

## 2015-07-30 DIAGNOSIS — E113211 Type 2 diabetes mellitus with mild nonproliferative diabetic retinopathy with macular edema, right eye: Secondary | ICD-10-CM | POA: Diagnosis not present

## 2015-08-14 ENCOUNTER — Other Ambulatory Visit: Payer: Self-pay | Admitting: Cardiology

## 2015-08-20 ENCOUNTER — Other Ambulatory Visit: Payer: Self-pay | Admitting: Cardiology

## 2015-08-29 ENCOUNTER — Other Ambulatory Visit: Payer: Self-pay

## 2015-08-29 ENCOUNTER — Other Ambulatory Visit: Payer: Self-pay | Admitting: Cardiology

## 2015-08-29 MED ORDER — FAMOTIDINE 40 MG PO TABS: 40.0000 mg | ORAL_TABLET | Freq: Every day | ORAL | Status: DC

## 2015-09-24 DIAGNOSIS — E113211 Type 2 diabetes mellitus with mild nonproliferative diabetic retinopathy with macular edema, right eye: Secondary | ICD-10-CM | POA: Diagnosis not present

## 2015-09-29 ENCOUNTER — Other Ambulatory Visit: Payer: Self-pay | Admitting: Cardiology

## 2015-10-01 DIAGNOSIS — Z961 Presence of intraocular lens: Secondary | ICD-10-CM | POA: Diagnosis not present

## 2015-10-29 ENCOUNTER — Other Ambulatory Visit: Payer: Self-pay | Admitting: *Deleted

## 2015-10-29 NOTE — Telephone Encounter (Signed)
Please refer to PCP. Thanks. 

## 2015-10-29 NOTE — Telephone Encounter (Signed)
Pharmacy calling for patient to see if the patient's new Dr. Evaristo Buryan refill his glipizide. It was previously refilled from Dr. Patty SermonsBrackbill. I let the pharmacist know last rx had stated to refill further through PCP. Pharmacist stated patient does not have a PCP and does not have anymore refills for this medication. I let him know I will forward this message and refill to the patient's new Dr.

## 2015-10-30 ENCOUNTER — Telehealth: Payer: Self-pay | Admitting: Interventional Cardiology

## 2015-10-30 NOTE — Telephone Encounter (Signed)
Returned call to pharmacy about Mr. Jerry MaskerMorris Benson. Dr. Eldridge DaceVaranasi does not refill diabetic medications.  To our understanding when Dr. Patty SermonsBrackbill told his patients of his retirement it was also known they should seek a PCP as well due to our cardiologist doing only cardiology and cardiology medications. Notifications have been sent to pharmacy via efax rx. All previous diabetic refills (3) dating 2017 up to now have been noted to be refilled by PCP per [name]. Only one was refill personally by [name] and [name] with the note:  metFORMIN (GLUCOPHAGE) 500 MG tablet 270 tablet 0 08/16/2015      Sig:  TAKE 1 TABLET BY MOUTH THREE TIMES DAILY WITH MEALS    Class:  Normal    DAW:  No    Comment:  Additional refills from Primary Care Physician, please put note on bag    Authorizing Provider:  Cassell Clementhomas [name], MD    Ordering User:  Burnell BlanksMelinda B Pratt    Pharmacy says they have never received these notes but said they asked the patient about his PCP refilling the medications. I was unable to reach patient to clarify if in fact they were given these messages.

## 2015-10-30 NOTE — Telephone Encounter (Signed)
New message       *STAT* If patient is at the pharmacy, call can be transferred to refill team.   1. Which medications need to be refilled? (please list name of each medication and dose if known) glipizide 10mg ----pt is out of rx-- 2. Which pharmacy/location (including street and city if local pharmacy) is medication to be sent to? [facility]----pt is there at pharmacy 3. Do they need a 30 day or 90 day supply? 30

## 2015-11-19 ENCOUNTER — Encounter: Payer: Self-pay | Admitting: Interventional Cardiology

## 2015-11-19 ENCOUNTER — Ambulatory Visit (INDEPENDENT_AMBULATORY_CARE_PROVIDER_SITE_OTHER): Payer: Medicare Other | Admitting: Interventional Cardiology

## 2015-11-19 VITALS — BP 160/70 | HR 60 | Ht 65.0 in | Wt 137.0 lb

## 2015-11-19 DIAGNOSIS — I2510 Atherosclerotic heart disease of native coronary artery without angina pectoris: Secondary | ICD-10-CM

## 2015-11-19 DIAGNOSIS — Z794 Long term (current) use of insulin: Secondary | ICD-10-CM

## 2015-11-19 DIAGNOSIS — Z951 Presence of aortocoronary bypass graft: Secondary | ICD-10-CM

## 2015-11-19 DIAGNOSIS — E119 Type 2 diabetes mellitus without complications: Secondary | ICD-10-CM | POA: Diagnosis not present

## 2015-11-19 DIAGNOSIS — E7800 Pure hypercholesterolemia, unspecified: Secondary | ICD-10-CM

## 2015-11-19 DIAGNOSIS — R413 Other amnesia: Secondary | ICD-10-CM

## 2015-11-19 DIAGNOSIS — I2583 Coronary atherosclerosis due to lipid rich plaque: Secondary | ICD-10-CM

## 2015-11-19 LAB — COMPREHENSIVE METABOLIC PANEL
ALBUMIN: 4.3 g/dL (ref 3.6–5.1)
ALK PHOS: 57 U/L (ref 40–115)
ALT: 11 U/L (ref 9–46)
AST: 19 U/L (ref 10–35)
BILIRUBIN TOTAL: 1.1 mg/dL (ref 0.2–1.2)
BUN: 21 mg/dL (ref 7–25)
CO2: 27 mmol/L (ref 20–31)
CREATININE: 1.24 mg/dL — AB (ref 0.70–1.11)
Calcium: 9.2 mg/dL (ref 8.6–10.3)
Chloride: 102 mmol/L (ref 98–110)
Glucose, Bld: 195 mg/dL — ABNORMAL HIGH (ref 65–99)
Potassium: 4.4 mmol/L (ref 3.5–5.3)
SODIUM: 139 mmol/L (ref 135–146)
TOTAL PROTEIN: 6.5 g/dL (ref 6.1–8.1)

## 2015-11-19 LAB — LIPID PANEL
CHOLESTEROL: 127 mg/dL (ref 125–200)
HDL: 45 mg/dL (ref >=40)
LDL CALC: 62 mg/dL (ref <130)
TRIGLYCERIDES: 98 mg/dL (ref <150)
Total CHOL/HDL Ratio: 2.8 Ratio (ref <=5.0)
VLDL: 20 mg/dL (ref <30)

## 2015-11-19 LAB — HEMOGLOBIN A1C
Hgb A1c MFr Bld: 7.4 % — ABNORMAL HIGH (ref <5.7)
Mean Plasma Glucose: 166 mg/dL

## 2015-11-19 MED ORDER — GLIPIZIDE 10 MG PO TABS: 10.0000 mg | ORAL_TABLET | Freq: Every day | ORAL | Status: DC

## 2015-11-19 NOTE — Progress Notes (Signed)
Patient ID: Jerry Benson, male   DOB: October 20, 1928, 82 y.o.   MRN: [id number]     Cardiology Office Note   Date:  11/19/2015   ID:  [name], DOB [birth year], MRN [id number]  PCP:  Ronny Flurry, MD    No chief complaint on file. f/u CAD   Wt Readings from Last 3 Encounters:  11/19/15 137 lb (62.143 kg)  07/11/15 138 lb 1.9 oz (62.651 kg)  01/15/15 137 lb (62.143 kg)       History of Present Illness: JEQUAN SHAHIN is a 82 y.o. male  Who has had extensive CAD in the past.  He has ha dCABG in 1981 and redo surgery in 2009.  He had a DES in 2013.  He admits that his memory is poor.  He lives with his wife and daughter.  THe daughter has some health challenges as well.    THe patient occasionally has heart burn.  He occasionally take SL NTG.    He does not walk much. He complains of leg weakness. He has fallen.    Past Medical History  Diagnosis Date  . Coronary artery disease     a. Remote MI 1981;  b. Prior CABG per Dr. Hendricks Milo in 1981;  c. redo CABG in 2009;  d. PCI to SVG to DX and OM in 2010;  e. PCI/DES VG-> OM (ISR) w/ 2.75x50mm Resolute DES Mar. 2013  . Diabetes mellitus   . Hypertension   . Hyperlipidemia   . Chest pain   . GERD (gastroesophageal reflux disease)   . Status post insertion of non-drug eluting coronary artery stent 10/05/2008  . CHF (congestive heart failure) (HCC)   . CKD (chronic kidney disease) stage 3, GFR 30-59 ml/min     Past Surgical History  Procedure Laterality Date  . Coronary artery bypass graft      original surgery in in 1981 with redo surgery in 2009. His original surgery was by Dr. Hendricks Milo and he had a SVG to the RCA, SVG to DX/OM, and SVG to DX/LAD; Redo surgery per Dr. Laneta Simmers in 2009 with LIMA to LAD and SVG to PD  . Cardiac catheterization  10/11/2008    PCI to SVG to diagonal and OM in 2010  . Left heart catheterization with coronary/graft angiogram N/A 09/18/2011    Procedure: LEFT HEART CATHETERIZATION WITH  Isabel Caprice;  Surgeon: Herby Abraham, MD;  Location: [facility] CATH LAB;  Service: Cardiovascular;  Laterality: N/A;  . Percutaneous coronary stent intervention (pci-s) N/A 09/21/2011    Procedure: PERCUTANEOUS CORONARY STENT INTERVENTION (PCI-S);  Surgeon: Herby Abraham, MD;  Location: [facility] CATH LAB;  Service: Cardiovascular;  Laterality: N/A;     Current Outpatient Prescriptions  Medication Sig Dispense Refill  . aspirin 81 MG tablet Take 81 mg by mouth daily.      . benazepril (LOTENSIN) 20 MG tablet TAKE 1 TABLET BY MOUTH EVERY DAY 30 tablet 5  . clopidogrel (PLAVIX) 75 MG tablet TAKE 1 TABLET BY MOUTH DAILY 90 tablet 3  . donepezil (ARICEPT) 5 MG tablet Take 1 tablet (5 mg total) by mouth at bedtime. 30 tablet 5  . famotidine (PEPCID) 40 MG tablet Take 1 tablet (40 mg total) by mouth daily. 90 tablet 3  . isosorbide dinitrate (ISORDIL) 20 MG tablet TAKE 1 TABLET BY MOUTH THREE TIMES DAILY 90 tablet 10  . metFORMIN (GLUCOPHAGE) 500 MG tablet TAKE 1 TABLET BY MOUTH THREE TIMES DAILY WITH MEALS 270  tablet 0  . metoprolol (LOPRESSOR) 50 MG tablet TAKE 1/2 TABLET BY MOUTH TWICE DAILY 90 tablet 3  . Multiple Vitamins-Iron (MULTIVITAMIN/IRON PO) Take 1 tablet by mouth daily.     . nitroGLYCERIN (NITROSTAT) 0.4 MG SL tablet Place 0.4 mg under the tongue every 5 (five) minutes as needed for chest pain (UP TO 3 DOSES BEFORE CALLING EMS).    . rosuvastatin (CRESTOR) 10 MG tablet TAKE 1/2 TABLET BY MOUTH EVERY DAY 45 tablet 3  . [DISCONTINUED] benazepril-hydrochlorthiazide (LOTENSIN HCT) 20-12.5 MG per tablet Take 1 tablet by mouth daily. 30 tablet 11   No current facility-administered medications for this visit.    Allergies:   Amlodipine; Lipitor; and Lisinopril    Social History:  The patient  reports that he has never smoked. He has never used smokeless tobacco. He reports that he does not drink alcohol or use illicit drugs.   Family History:  The patient's *family history includes  Heart attack in his brother and father; Heart disease in his father and mother; Stroke in his daughter.    ROS:  Please see the history of present illness.   Otherwise, review of systems are positive for leg edema.   All other systems are reviewed and negative.    PHYSICAL EXAM: VS:  BP 160/70 mmHg  Pulse 60  Ht  (1.651 m)  Wt 137 lb (62.143 kg)  BMI 22.80 kg/m2 , BMI Body mass index is 22.8 kg/(m^2). GEN: Frail HEENT: normal Neck: no JVD, carotid bruits, or masses Cardiac: RRR; no murmurs, rubs, or gallops,no edema  Respiratory:  clear to auscultation bilaterally, normal work of breathing GI: soft, nontender, nondistended, + BS MS: no deformity or atrophy Skin: warm and dry, no rash Neuro:  Strength and sensation are intact Psych: euthymic mood, flat affect, poor memory   EKG:   The ekg ordered in January 2017 demonstrates normal sinus rhythm   Recent Labs: 01/15/2015: ALT 13; BUN 23; Creatinine, Ser 1.02; Hemoglobin 13.1; Platelets 128.0*; Potassium 4.6; Sodium 140   Lipid Panel    Component Value Date/Time   CHOL 131 01/15/2015 1233   TRIG 126.0 01/15/2015 1233   HDL 41.00 01/15/2015 1233   CHOLHDL 3 01/15/2015 1233   VLDL 25.2 01/15/2015 1233   LDLCALC 65 01/15/2015 1233     Other studies Reviewed: Additional studies/ records that were reviewed today with results demonstrating: .   ASSESSMENT AND PLAN:  1. CAD : Extensive coronary artery disease. No clear angina. He does have reflux for which she takes sublingual nitroglycerin occasionally. Given his dementia, it is difficult to know exactly how much relief he is having. He clearly is not limited by anginal symptoms. His physical activity is more limited by leg weakness.  Given his dementia, I would not pursue much invasive testing unless there were extenuating circumstances. 2. DM: We'll refill glipizide today. He is in the process of trying to get an appointment with Dr. Roselind Messier to be his new primary  care doctor. 3. Hyperlipidemia: Lipids well controlled in 2016. Will recheck. Continue current lipid-lowering therapy. 4. HTN:  Elevated today. I've asked him to check his blood pressure at home. He does have a blood pressure cuff. He'll also follow-up with primary care doctor. 5. Leg edema: He needs to elevate his legs. He would benefit from compression stockings as well. I spoke to his sister about this. She will try to get him 10-15 mmHg compression stockings.   Current medicines are reviewed at length  with the patient today.  The patient concerns regarding his medicines were addressed.  The following changes have been made:  No change  Labs/ tests ordered today include:  No orders of the defined types were placed in this encounter.    Recommend 150 minutes/week of aerobic exercise Low fat, low carb, high fiber diet recommended  Disposition:   FU in 1 year   Signed, Lance MussJayadeep Archibald Marchetta, MD  11/19/2015 11:38 AM    [facility] [address], [place], [place]  [address] Phone: [phone](336) [phone]; Fax: [phone]) [phone]

## 2015-11-19 NOTE — Patient Instructions (Signed)
Medication Instructions:  Same-no changes  Labwork: Lipids, CMET and HGB A1c today  Testing/Procedures: None  Follow-Up: Your physician wants you to follow-up in: 1 year. You will receive a reminder letter in the mail two months in advance. If you don't receive a letter, please call our office to schedule the follow-up appointment.     If you need a refill on your cardiac medications before your next appointment, please call your pharmacy.    

## 2015-11-21 ENCOUNTER — Encounter: Payer: Self-pay | Admitting: *Deleted

## 2015-11-21 ENCOUNTER — Telehealth: Payer: Self-pay | Admitting: Interventional Cardiology

## 2015-11-21 NOTE — Telephone Encounter (Signed)
Spoke with pt's wife, DPR on file, and informed her of lab results. Wife verbalized understanding and was in agreement with this plan. Wife states that pt is not yet established with Dr. Kevan NyGates but she will be in contact with office to see if they will take him as a pt. Wife states they have another Doctor in mind if Dr. Kevan NyGates can't take him. Advised wife to make sure he is seen soon and that I will mail a copy of these labs to her so they will have a copy to take to his new PCP when they decide who he will see. Wife appreciative for call back.

## 2015-11-21 NOTE — Telephone Encounter (Signed)
Follow Up   Pt wife request a call back to discuss the labs.

## 2015-11-25 ENCOUNTER — Other Ambulatory Visit: Payer: Self-pay | Admitting: *Deleted

## 2015-11-25 MED ORDER — BENAZEPRIL HCL 20 MG PO TABS: 20.0000 mg | ORAL_TABLET | Freq: Every day | ORAL | Status: DC

## 2015-12-24 ENCOUNTER — Other Ambulatory Visit: Payer: Self-pay

## 2015-12-24 DIAGNOSIS — E119 Type 2 diabetes mellitus without complications: Secondary | ICD-10-CM | POA: Diagnosis not present

## 2016-02-12 ENCOUNTER — Other Ambulatory Visit: Payer: Self-pay | Admitting: Interventional Cardiology

## 2016-03-16 ENCOUNTER — Other Ambulatory Visit: Payer: Self-pay

## 2016-03-16 ENCOUNTER — Other Ambulatory Visit: Payer: Self-pay | Admitting: Interventional Cardiology

## 2016-03-16 MED ORDER — NITROGLYCERIN 0.4 MG SL SUBL: 0.4000 mg | SUBLINGUAL_TABLET | SUBLINGUAL | 3 refills | Status: DC | PRN

## 2016-04-01 DIAGNOSIS — I679 Cerebrovascular disease, unspecified: Secondary | ICD-10-CM | POA: Diagnosis not present

## 2016-04-01 DIAGNOSIS — I2510 Atherosclerotic heart disease of native coronary artery without angina pectoris: Secondary | ICD-10-CM | POA: Diagnosis not present

## 2016-04-01 DIAGNOSIS — Z23 Encounter for immunization: Secondary | ICD-10-CM | POA: Diagnosis not present

## 2016-04-01 DIAGNOSIS — M6281 Muscle weakness (generalized): Secondary | ICD-10-CM | POA: Diagnosis not present

## 2016-04-01 DIAGNOSIS — D509 Iron deficiency anemia, unspecified: Secondary | ICD-10-CM | POA: Diagnosis not present

## 2016-04-01 DIAGNOSIS — R209 Unspecified disturbances of skin sensation: Secondary | ICD-10-CM | POA: Diagnosis not present

## 2016-04-01 DIAGNOSIS — E119 Type 2 diabetes mellitus without complications: Secondary | ICD-10-CM | POA: Diagnosis not present

## 2016-04-01 DIAGNOSIS — Z125 Encounter for screening for malignant neoplasm of prostate: Secondary | ICD-10-CM | POA: Diagnosis not present

## 2016-04-01 DIAGNOSIS — E559 Vitamin D deficiency, unspecified: Secondary | ICD-10-CM | POA: Diagnosis not present

## 2016-04-01 DIAGNOSIS — E1140 Type 2 diabetes mellitus with diabetic neuropathy, unspecified: Secondary | ICD-10-CM | POA: Diagnosis not present

## 2016-04-14 ENCOUNTER — Observation Stay (HOSPITAL_COMMUNITY)
Admission: EM | Admit: 2016-04-14 | Discharge: 2016-04-16 | Disposition: A | Discharge status: Home / Self Care | Payer: Medicare Other | Attending: Internal Medicine | Admitting: Internal Medicine

## 2016-04-14 ENCOUNTER — Emergency Department (HOSPITAL_COMMUNITY): Payer: Medicare Other

## 2016-04-14 ENCOUNTER — Encounter (HOSPITAL_COMMUNITY): Payer: Self-pay

## 2016-04-14 DIAGNOSIS — Z951 Presence of aortocoronary bypass graft: Secondary | ICD-10-CM | POA: Diagnosis not present

## 2016-04-14 DIAGNOSIS — I6529 Occlusion and stenosis of unspecified carotid artery: Secondary | ICD-10-CM | POA: Diagnosis not present

## 2016-04-14 DIAGNOSIS — R410 Disorientation, unspecified: Secondary | ICD-10-CM | POA: Diagnosis not present

## 2016-04-14 DIAGNOSIS — I700 Atherosclerosis of aorta: Secondary | ICD-10-CM | POA: Diagnosis not present

## 2016-04-14 DIAGNOSIS — N183 Chronic kidney disease, stage 3 unspecified: Secondary | ICD-10-CM | POA: Diagnosis present

## 2016-04-14 DIAGNOSIS — R030 Elevated blood-pressure reading, without diagnosis of hypertension: Secondary | ICD-10-CM | POA: Diagnosis not present

## 2016-04-14 DIAGNOSIS — R4701 Aphasia: Secondary | ICD-10-CM | POA: Diagnosis present

## 2016-04-14 DIAGNOSIS — E1159 Type 2 diabetes mellitus with other circulatory complications: Secondary | ICD-10-CM | POA: Diagnosis present

## 2016-04-14 DIAGNOSIS — Z7982 Long term (current) use of aspirin: Secondary | ICD-10-CM | POA: Diagnosis not present

## 2016-04-14 DIAGNOSIS — I771 Stricture of artery: Secondary | ICD-10-CM | POA: Insufficient documentation

## 2016-04-14 DIAGNOSIS — I6523 Occlusion and stenosis of bilateral carotid arteries: Secondary | ICD-10-CM | POA: Diagnosis not present

## 2016-04-14 DIAGNOSIS — I25110 Atherosclerotic heart disease of native coronary artery with unstable angina pectoris: Secondary | ICD-10-CM | POA: Insufficient documentation

## 2016-04-14 DIAGNOSIS — E1122 Type 2 diabetes mellitus with diabetic chronic kidney disease: Secondary | ICD-10-CM | POA: Insufficient documentation

## 2016-04-14 DIAGNOSIS — I4891 Unspecified atrial fibrillation: Secondary | ICD-10-CM | POA: Insufficient documentation

## 2016-04-14 DIAGNOSIS — G4489 Other headache syndrome: Secondary | ICD-10-CM | POA: Diagnosis not present

## 2016-04-14 DIAGNOSIS — Z7902 Long term (current) use of antithrombotics/antiplatelets: Secondary | ICD-10-CM | POA: Insufficient documentation

## 2016-04-14 DIAGNOSIS — I252 Old myocardial infarction: Secondary | ICD-10-CM | POA: Insufficient documentation

## 2016-04-14 DIAGNOSIS — F0390 Unspecified dementia without behavioral disturbance: Secondary | ICD-10-CM | POA: Diagnosis not present

## 2016-04-14 DIAGNOSIS — I130 Hypertensive heart and chronic kidney disease with heart failure and stage 1 through stage 4 chronic kidney disease, or unspecified chronic kidney disease: Secondary | ICD-10-CM | POA: Diagnosis not present

## 2016-04-14 DIAGNOSIS — E1151 Type 2 diabetes mellitus with diabetic peripheral angiopathy without gangrene: Secondary | ICD-10-CM | POA: Diagnosis not present

## 2016-04-14 DIAGNOSIS — G459 Transient cerebral ischemic attack, unspecified: Secondary | ICD-10-CM | POA: Diagnosis not present

## 2016-04-14 DIAGNOSIS — I6502 Occlusion and stenosis of left vertebral artery: Secondary | ICD-10-CM | POA: Insufficient documentation

## 2016-04-14 DIAGNOSIS — Z888 Allergy status to other drugs, medicaments and biological substances status: Secondary | ICD-10-CM | POA: Insufficient documentation

## 2016-04-14 DIAGNOSIS — Z955 Presence of coronary angioplasty implant and graft: Secondary | ICD-10-CM | POA: Diagnosis not present

## 2016-04-14 DIAGNOSIS — I2510 Atherosclerotic heart disease of native coronary artery without angina pectoris: Secondary | ICD-10-CM | POA: Diagnosis present

## 2016-04-14 DIAGNOSIS — E785 Hyperlipidemia, unspecified: Secondary | ICD-10-CM | POA: Insufficient documentation

## 2016-04-14 DIAGNOSIS — Z7984 Long term (current) use of oral hypoglycemic drugs: Secondary | ICD-10-CM | POA: Insufficient documentation

## 2016-04-14 DIAGNOSIS — R4789 Other speech disturbances: Secondary | ICD-10-CM | POA: Insufficient documentation

## 2016-04-14 DIAGNOSIS — K219 Gastro-esophageal reflux disease without esophagitis: Secondary | ICD-10-CM | POA: Diagnosis not present

## 2016-04-14 DIAGNOSIS — I509 Heart failure, unspecified: Secondary | ICD-10-CM | POA: Insufficient documentation

## 2016-04-14 DIAGNOSIS — I10 Essential (primary) hypertension: Secondary | ICD-10-CM | POA: Diagnosis present

## 2016-04-14 DIAGNOSIS — G9340 Encephalopathy, unspecified: Principal | ICD-10-CM | POA: Diagnosis present

## 2016-04-14 LAB — DIFFERENTIAL
BASOS ABS: 0 10*3/uL (ref 0.0–0.1)
BASOS PCT: 0 %
Eosinophils Absolute: 0.2 10*3/uL (ref 0.0–0.7)
Eosinophils Relative: 2 %
Lymphocytes Relative: 12 %
Lymphs Abs: 1.1 10*3/uL (ref 0.7–4.0)
MONOS PCT: 4 %
Monocytes Absolute: 0.3 10*3/uL (ref 0.1–1.0)
NEUTROS ABS: 7.9 10*3/uL — AB (ref 1.7–7.7)
NEUTROS PCT: 82 %

## 2016-04-14 LAB — CBC
HEMATOCRIT: 40.9 % (ref 39.0–52.0)
Hemoglobin: 13.5 g/dL (ref 13.0–17.0)
MCH: 29.5 pg (ref 26.0–34.0)
MCHC: 33 g/dL (ref 30.0–36.0)
MCV: 89.3 fL (ref 78.0–100.0)
PLATELETS: 124 10*3/uL — AB (ref 150–400)
RBC: 4.58 MIL/uL (ref 4.22–5.81)
RDW: 12.5 % (ref 11.5–15.5)
WBC: 9.5 10*3/uL (ref 4.0–10.5)

## 2016-04-14 LAB — RAPID URINE DRUG SCREEN, HOSP PERFORMED
AMPHETAMINES: NOT DETECTED
BARBITURATES: NOT DETECTED
BENZODIAZEPINES: NOT DETECTED
COCAINE: NOT DETECTED
Opiates: NOT DETECTED
Tetrahydrocannabinol: NOT DETECTED

## 2016-04-14 LAB — GLUCOSE, CAPILLARY: GLUCOSE-CAPILLARY: 82 mg/dL (ref 65–99)

## 2016-04-14 LAB — URINE MICROSCOPIC-ADD ON
BACTERIA UA: NONE SEEN
SQUAMOUS EPITHELIAL / LPF: NONE SEEN

## 2016-04-14 LAB — I-STAT CHEM 8, ED
BUN: 22 mg/dL — ABNORMAL HIGH (ref 6–20)
CHLORIDE: 102 mmol/L (ref 101–111)
Calcium, Ion: 1.07 mmol/L — ABNORMAL LOW (ref 1.15–1.40)
Creatinine, Ser: 0.9 mg/dL (ref 0.61–1.24)
GLUCOSE: 86 mg/dL (ref 65–99)
HCT: 42 % (ref 39.0–52.0)
Hemoglobin: 14.3 g/dL (ref 13.0–17.0)
POTASSIUM: 4.6 mmol/L (ref 3.5–5.1)
Sodium: 138 mmol/L (ref 135–145)
TCO2: 27 mmol/L (ref 0–100)

## 2016-04-14 LAB — COMPREHENSIVE METABOLIC PANEL
ALBUMIN: 4.5 g/dL (ref 3.5–5.0)
ALT: 14 U/L — AB (ref 17–63)
AST: 25 U/L (ref 15–41)
Alkaline Phosphatase: 58 U/L (ref 38–126)
Anion gap: 9 (ref 5–15)
BUN: 13 mg/dL (ref 6–20)
CHLORIDE: 104 mmol/L (ref 101–111)
CO2: 26 mmol/L (ref 22–32)
CREATININE: 1.01 mg/dL (ref 0.61–1.24)
Calcium: 9.9 mg/dL (ref 8.9–10.3)
GFR calc Af Amer: >60 mL/min (ref >60)
GFR calc non Af Amer: >60 mL/min (ref >60)
GLUCOSE: 91 mg/dL (ref 65–99)
POTASSIUM: 3.9 mmol/L (ref 3.5–5.1)
SODIUM: 139 mmol/L (ref 135–145)
Total Bilirubin: 1.4 mg/dL — ABNORMAL HIGH (ref 0.3–1.2)
Total Protein: 6.8 g/dL (ref 6.5–8.1)

## 2016-04-14 LAB — URINALYSIS, ROUTINE W REFLEX MICROSCOPIC
BILIRUBIN URINE: NEGATIVE
GLUCOSE, UA: NEGATIVE mg/dL
HGB URINE DIPSTICK: NEGATIVE
Ketones, ur: NEGATIVE mg/dL
Leukocytes, UA: NEGATIVE
Nitrite: NEGATIVE
PROTEIN: 30 mg/dL — AB
Specific Gravity, Urine: 1.01 (ref 1.005–1.030)
pH: 7.5 (ref 5.0–8.0)

## 2016-04-14 LAB — PROTIME-INR
INR: 1.01
Prothrombin Time: 13.4 seconds (ref 11.4–15.2)

## 2016-04-14 LAB — TSH: TSH: 0.699 u[IU]/mL (ref 0.350–4.500)

## 2016-04-14 LAB — I-STAT TROPONIN, ED: Troponin i, poc: 0.01 ng/mL (ref 0.00–0.08)

## 2016-04-14 LAB — T4, FREE: FREE T4: 0.81 ng/dL (ref 0.61–1.12)

## 2016-04-14 LAB — SEDIMENTATION RATE: SED RATE: 5 mm/h (ref 0–16)

## 2016-04-14 LAB — CBG MONITORING, ED: GLUCOSE-CAPILLARY: 104 mg/dL — AB (ref 65–99)

## 2016-04-14 LAB — VITAMIN B12: VITAMIN B 12: 294 pg/mL (ref 180–914)

## 2016-04-14 LAB — ETHANOL

## 2016-04-14 LAB — APTT: APTT: 31 s (ref 24–36)

## 2016-04-14 LAB — AMMONIA: Ammonia: 12 umol/L (ref 9–35)

## 2016-04-14 MED ORDER — MORPHINE SULFATE (PF) 2 MG/ML IV SOLN
2.0000 mg | Freq: Once | INTRAVENOUS | Status: AC
  Administered 2016-04-14: 2 mg via INTRAVENOUS
  Filled 2016-04-14: qty 1

## 2016-04-14 MED ORDER — ISOSORBIDE DINITRATE 20 MG PO TABS
20.0000 mg | ORAL_TABLET | Freq: Three times a day (TID) | ORAL | Status: DC
  Administered 2016-04-14 – 2016-04-15 (×4): 20 mg via ORAL
  Filled 2016-04-14 (×6): qty 1

## 2016-04-14 MED ORDER — INSULIN ASPART 100 UNIT/ML ~~LOC~~ SOLN
0.0000 [IU] | Freq: Three times a day (TID) | SUBCUTANEOUS | Status: DC
  Administered 2016-04-15: 1 [IU] via SUBCUTANEOUS
  Administered 2016-04-15: 2 [IU] via SUBCUTANEOUS

## 2016-04-14 MED ORDER — DONEPEZIL HCL 5 MG PO TABS
5.0000 mg | ORAL_TABLET | Freq: Every day | ORAL | Status: DC
  Administered 2016-04-14 – 2016-04-15 (×2): 5 mg via ORAL
  Filled 2016-04-14 (×2): qty 1

## 2016-04-14 MED ORDER — DEXTROSE 5 % IV SOLN
2.0000 g | Freq: Once | INTRAVENOUS | Status: AC
  Administered 2016-04-15: 2 g via INTRAVENOUS
  Filled 2016-04-14: qty 2

## 2016-04-14 MED ORDER — DEXTROSE 5 % IV SOLN
2.0000 g | Freq: Two times a day (BID) | INTRAVENOUS | Status: DC
  Filled 2016-04-14 (×2): qty 2

## 2016-04-14 MED ORDER — VANCOMYCIN HCL 10 G IV SOLR
1250.0000 mg | Freq: Once | INTRAVENOUS | Status: AC
  Administered 2016-04-15: 1250 mg via INTRAVENOUS
  Filled 2016-04-14: qty 1250

## 2016-04-14 MED ORDER — VANCOMYCIN HCL IN DEXTROSE 1-5 GM/200ML-% IV SOLN: 1000.0000 mg | Freq: Two times a day (BID) | INTRAVENOUS | Status: DC

## 2016-04-14 MED ORDER — ACETAMINOPHEN 325 MG PO TABS: 650.0000 mg | ORAL_TABLET | Freq: Four times a day (QID) | ORAL | Status: DC | PRN

## 2016-04-14 MED ORDER — ASPIRIN EC 81 MG PO TBEC
81.0000 mg | DELAYED_RELEASE_TABLET | Freq: Every day | ORAL | Status: DC
  Administered 2016-04-15: 81 mg via ORAL
  Filled 2016-04-14 (×2): qty 1

## 2016-04-14 MED ORDER — SODIUM CHLORIDE 0.9 % IV SOLN
INTRAVENOUS | Status: AC
  Administered 2016-04-14: 23:00:00 via INTRAVENOUS

## 2016-04-14 MED ORDER — METOPROLOL TARTRATE 25 MG PO TABS
25.0000 mg | ORAL_TABLET | Freq: Two times a day (BID) | ORAL | Status: DC
  Administered 2016-04-14 – 2016-04-15 (×3): 25 mg via ORAL
  Filled 2016-04-14 (×4): qty 1

## 2016-04-14 MED ORDER — FAMOTIDINE 20 MG PO TABS: 40.0000 mg | ORAL_TABLET | Freq: Every day | ORAL | Status: DC

## 2016-04-14 MED ORDER — SODIUM CHLORIDE 0.9 % IV SOLN
2.0000 g | INTRAVENOUS | Status: DC
  Administered 2016-04-14 – 2016-04-15 (×4): 2 g via INTRAVENOUS
  Filled 2016-04-14 (×7): qty 2000

## 2016-04-14 MED ORDER — NITROGLYCERIN 0.4 MG SL SUBL: 0.4000 mg | SUBLINGUAL_TABLET | SUBLINGUAL | Status: DC | PRN

## 2016-04-14 MED ORDER — VANCOMYCIN HCL IN DEXTROSE 1-5 GM/200ML-% IV SOLN: 1000.0000 mg | Freq: Once | INTRAVENOUS | Status: DC

## 2016-04-14 MED ORDER — CLOPIDOGREL BISULFATE 75 MG PO TABS
75.0000 mg | ORAL_TABLET | Freq: Every day | ORAL | Status: DC
  Administered 2016-04-15: 75 mg via ORAL
  Filled 2016-04-14 (×2): qty 1

## 2016-04-14 MED ORDER — ROSUVASTATIN CALCIUM 10 MG PO TABS
10.0000 mg | ORAL_TABLET | Freq: Every day | ORAL | Status: DC
  Administered 2016-04-14 – 2016-04-15 (×2): 10 mg via ORAL
  Filled 2016-04-14: qty 1
  Filled 2016-04-14: qty 2
  Filled 2016-04-14: qty 1
  Filled 2016-04-14: qty 2

## 2016-04-14 MED ORDER — ONDANSETRON HCL 4 MG/2ML IJ SOLN
4.0000 mg | Freq: Once | INTRAMUSCULAR | Status: AC
  Administered 2016-04-14: 4 mg via INTRAVENOUS
  Filled 2016-04-14: qty 2

## 2016-04-14 MED ORDER — BENAZEPRIL HCL 20 MG PO TABS
20.0000 mg | ORAL_TABLET | Freq: Every day | ORAL | Status: DC
  Administered 2016-04-15: 20 mg via ORAL
  Filled 2016-04-14 (×2): qty 1
  Filled 2016-04-14: qty 0.5

## 2016-04-14 MED ORDER — CEFTRIAXONE SODIUM 1 G IJ SOLR
1.0000 g | Freq: Once | INTRAMUSCULAR | Status: DC
Start: 2016-04-14 — End: 2016-04-14

## 2016-04-14 MED ORDER — IOPAMIDOL (ISOVUE-370) INJECTION 76%
INTRAVENOUS | Status: AC
  Filled 2016-04-14: qty 100

## 2016-04-14 MED ORDER — VANCOMYCIN HCL 500 MG IV SOLR
500.0000 mg | Freq: Two times a day (BID) | INTRAVENOUS | Status: DC
  Administered 2016-04-15: 500 mg via INTRAVENOUS
  Filled 2016-04-14 (×2): qty 500

## 2016-04-14 MED ORDER — DEXTROSE 5 % IV SOLN
620.0000 mg | Freq: Three times a day (TID) | INTRAVENOUS | Status: DC
  Administered 2016-04-15: 620 mg via INTRAVENOUS
  Filled 2016-04-14 (×3): qty 12.4

## 2016-04-14 MED ORDER — DEXTROSE 5 % IV SOLN
10.0000 mg/kg | Freq: Once | INTRAVENOUS | Status: AC
  Administered 2016-04-14: 620 mg via INTRAVENOUS
  Filled 2016-04-14: qty 12.4

## 2016-04-14 NOTE — ED Provider Notes (Signed)
[facility]-EMERGENCY DEPT Provider Note   CSN: [id number] Arrival date & time: 04/14/16  1550     History   Chief Complaint Chief Complaint  Patient presents with  . Altered Mental Status  . Headache    HPI  There were no vitals taken for this visit.  Jerry Benson is a 82 y.o. male with past medical history significant for CAD, CHF, hyperlipidemia, diabetes and mild dementia complaining of expressive aphasia intermittent waxing and waning last seen normal last night before bed, states he woke up with symptoms as per his wife. Patient is accompanied by family members who supplied most of the history. Patient takes Plavix, denies trauma, reports headache and cervicalgia.   HPI  Past Medical History:  Diagnosis Date  . Chest pain   . CHF (congestive heart failure) (HCC)   . CKD (chronic kidney disease) stage 3, GFR 30-59 ml/min   . Coronary artery disease    a. Remote MI 1981;  b. Prior CABG per Dr. Hendricks Milo in 1981;  c. redo CABG in 2009;  d. PCI to SVG to DX and OM in 2010;  e. PCI/DES VG-> OM (ISR) w/ 2.75x74mm Resolute DES Mar. 2013  . Diabetes mellitus   . GERD (gastroesophageal reflux disease)   . Hyperlipidemia   . Hypertension   . Status post insertion of non-drug eluting coronary artery stent 10/05/2008    Patient Active Problem List   Diagnosis Date Noted  . Word finding difficulty 04/14/2016  . TIA (transient ischemic attack) 07/11/2015  . Memory disorder 05/28/2014  . Postherpetic neuralgia 09/25/2013  . Intertriginous candidiasis 01/12/2013  . Iron deficiency anemia 09/15/2012  . Malaise and fatigue 11/16/2011  . Unstable angina pectoris (HCC) 09/19/2011  . CKD (chronic kidney disease) stage 3, GFR 30-59 ml/min 09/19/2011  . CAD (coronary artery disease) 01/21/2011  . Type II or unspecified type diabetes mellitus without mention of complication, uncontrolled 10/17/2010  . Hypercholesterolemia 10/17/2010  . Hx of CABG 10/17/2010  . Benign hypertensive  heart disease without heart failure 10/17/2010  . Dyspepsia 10/17/2010    Past Surgical History:  Procedure Laterality Date  . CARDIAC CATHETERIZATION  10/11/2008   PCI to SVG to diagonal and OM in 2010  . CORONARY ARTERY BYPASS GRAFT     original surgery in in 1981 with redo surgery in 2009. His original surgery was by Dr. Hendricks Milo and he had a SVG to the RCA, SVG to DX/OM, and SVG to DX/LAD; Redo surgery per Dr. Laneta Simmers in 2009 with LIMA to LAD and SVG to PD  . LEFT HEART CATHETERIZATION WITH CORONARY/GRAFT ANGIOGRAM N/A 09/18/2011   Procedure: LEFT HEART CATHETERIZATION WITH Isabel Caprice;  Surgeon: Herby Abraham, MD;  Location: [facility] CATH LAB;  Service: Cardiovascular;  Laterality: N/A;  . PERCUTANEOUS CORONARY STENT INTERVENTION (PCI-S) N/A 09/21/2011   Procedure: PERCUTANEOUS CORONARY STENT INTERVENTION (PCI-S);  Surgeon: Herby Abraham, MD;  Location: [facility] CATH LAB;  Service: Cardiovascular;  Laterality: N/A;       Home Medications    Prior to Admission medications   Medication Sig Start Date End Date Taking? Authorizing Provider  aspirin 81 MG tablet Take 81 mg by mouth daily.     Yes Historical Provider, MD  benazepril (LOTENSIN) 20 MG tablet Take 1 tablet (20 mg total) by mouth daily. 11/25/15  Yes Corky Crafts, MD  clopidogrel (PLAVIX) 75 MG tablet TAKE 1 TABLET BY MOUTH DAILY 07/17/15  Yes Cassell Clement, MD  glipiZIDE (GLUCOTROL) 10 MG  tablet Take 1 tablet (10 mg total) by mouth daily before breakfast. 11/19/15  Yes Corky Crafts, MD  isosorbide dinitrate (ISORDIL) 20 MG tablet TAKE 1 TABLET BY MOUTH THREE TIMES DAILY 08/21/15  Yes Cassell Clement, MD  metFORMIN (GLUCOPHAGE) 500 MG tablet TAKE 1 TABLET BY MOUTH THREE TIMES DAILY WITH MEALS 08/16/15  Yes Cassell Clement, MD  metoprolol (LOPRESSOR) 50 MG tablet TAKE 1/2 TABLET BY MOUTH TWICE DAILY 02/12/16  Yes Corky Crafts, MD  Multiple Vitamins-Iron (MULTIVITAMIN/IRON PO) Take 1 tablet by mouth  daily.    Yes Historical Provider, MD  nitroGLYCERIN (NITROSTAT) 0.4 MG SL tablet PLACE 1 TABLET ONE TABLET UNDER TONGUE AS NEEDED FOR CHEST PAIN 03/16/16  Yes Corky Crafts, MD  rosuvastatin (CRESTOR) 10 MG tablet TAKE 1/2 TABLET BY MOUTH EVERY DAY Patient taking differently: TAKE 1 TABLET BY MOUTH EVERY DAY 07/17/15  Yes Cassell Clement, MD  donepezil (ARICEPT) 5 MG tablet Take 1 tablet (5 mg total) by mouth at bedtime. Patient not taking: Reported on 04/14/2016 05/28/14   Cassell Clement, MD  famotidine (PEPCID) 40 MG tablet Take 1 tablet (40 mg total) by mouth daily. Patient not taking: Reported on 04/14/2016 08/29/15   Cassell Clement, MD    Family History Family History  Problem Relation Age of Onset  . Heart disease Father   . Heart attack Father   . Heart disease Mother   . Stroke Daughter     09/2008  . Heart attack Brother     Social History Social History  Substance Use Topics  . Smoking status: Never Smoker  . Smokeless tobacco: Never Used  . Alcohol use No     Allergies   Amlodipine; Lipitor [atorvastatin calcium]; and Lisinopril   Review of Systems Review of Systems  10 systems reviewed and found to be negative, except as noted in the HPI.   Physical Exam Updated Vital Signs BP 163/93 (BP Location: Right Arm)   Pulse 82   Temp 99.5 F (37.5 C) (Oral)   Resp 20   SpO2 95%   Physical Exam  Constitutional: He is oriented to person, place, and time. He appears well-developed and well-nourished. No distress.  HENT:  Head: Normocephalic and atraumatic.  Mouth/Throat: Oropharynx is clear and moist.  Eyes: Conjunctivae and EOM are normal. Pupils are equal, round, and reactive to light.  Neck: Normal range of motion.  No midline C-spine  tenderness to palpation or step-offs appreciated. Patient has full range of motion without pain.    Cardiovascular: Normal rate, regular rhythm and intact distal pulses.   Pulmonary/Chest: Effort normal and  breath sounds normal. No respiratory distress. He has no wheezes. He has no rales. He exhibits no tenderness.  Abdominal: Soft. He exhibits no distension and no mass. There is no tenderness. There is no rebound and no guarding. No hernia.  Musculoskeletal: Normal range of motion.  Neurological: He is alert and oriented to person, place, and time.  Intermittently garbled speech, mild confusion, moving all extremities, can follow commands with coaching. Tongue extension midline, positive gag reflex, pupils equal round and reactive. Negative pronator drift.  Skin: Capillary refill takes less than 2 seconds. He is not diaphoretic.  Psychiatric: He has a normal mood and affect.  Nursing note and vitals reviewed.    ED Treatments / Results  Labs (all labs ordered are listed, but only abnormal results are displayed) Labs Reviewed  CBC - Abnormal; Notable for the following:       Result  Value   Platelets 124 (*)    All other components within normal limits  DIFFERENTIAL - Abnormal; Notable for the following:    Neutro Abs 7.9 (*)    All other components within normal limits  COMPREHENSIVE METABOLIC PANEL - Abnormal; Notable for the following:    ALT 14 (*)    Total Bilirubin 1.4 (*)    All other components within normal limits  URINALYSIS, ROUTINE W REFLEX MICROSCOPIC (NOT AT [facility]) - Abnormal; Notable for the following:    Protein, ur 30 (*)    All other components within normal limits  CBG MONITORING, ED - Abnormal; Notable for the following:    Glucose-Capillary 104 (*)    All other components within normal limits  I-STAT CHEM 8, ED - Abnormal; Notable for the following:    BUN 22 (*)    Calcium, Ion 1.07 (*)    All other components within normal limits  CULTURE, BLOOD (ROUTINE X 2)  CULTURE, BLOOD (ROUTINE X 2)  ETHANOL  PROTIME-INR  APTT  RAPID URINE DRUG SCREEN, HOSP PERFORMED  URINE MICROSCOPIC-ADD ON  SEDIMENTATION RATE  I-STAT TROPOININ, ED    EKG  EKG  Interpretation  Date/Time:  Tuesday April 14 2016 15:52:55 EDT Ventricular Rate:  75 PR Interval:    QRS Duration: 114 QT Interval:  406 QTC Calculation: 454 R Axis:   3 Text Interpretation:  Sinus rhythm Borderline short PR interval LVH with IVCD and secondary repol abnrm Probable lateral infarct, age indeterminate Anterior ST elevation, probably due to LVH since last tracing no significant change Confirmed by Effie Shy  MD, ELLIOTT [phone]) on 04/14/2016 5:19:03 PM       Radiology Ct Angio Head W Or Wo Contrast  Result Date: 04/14/2016 CLINICAL DATA:  Pain and aphasia EXAM: CT ANGIOGRAPHY HEAD AND NECK TECHNIQUE: Multidetector CT imaging of the head and neck was performed using the standard protocol during bolus administration of intravenous contrast. Multiplanar CT image reconstructions and MIPs were obtained to evaluate the vascular anatomy. Carotid stenosis measurements (when applicable) are obtained utilizing NASCET criteria, using the distal internal carotid diameter as the denominator. CONTRAST:  100 mL Isovue 370 IV COMPARISON:  None. FINDINGS: CT HEAD FINDINGS Brain: No mass lesion, intraparenchymal hemorrhage or extra-axial collection. No evidence of acute cortical infarct. There is periventricular hypoattenuation compatible with chronic microvascular disease. Vascular: Left vertebral artery calcification. No hyperdense vessel sign. Skull: Normal visualized skull base, calvarium and extracranial soft tissues. Sinuses/Orbits: Status post left mastoidectomy. Paranasal sinuses are clear. Normal orbits. Review of the MIP images confirms the above findings CTA NECK FINDINGS Aortic arch: There is a bovine variant aortic arch branching pattern with the brachiocephalic and left common carotid artery is sharing a common origin. There is atherosclerotic calcification within the aortic arch. There is severe stenosis at the origin of the right subclavian artery. Left subclavian artery is unremarkable.  Right carotid system: There is atherosclerotic calcification at the bifurcation with stenosis measuring approximately 50%. The internal carotid artery follows a medial course at the level of the hypopharynx. Left carotid system: There is atherosclerotic calcification of the left carotid bifurcation with proximal internal carotid artery stenosis measuring 55%. The left internal carotid artery also follows a medial course of the level of the hypopharynx. Vertebral arteries: There is moderate narrowing of the left vertebral artery origin. The right vertebral artery origin is normal. The vertebral artery systems is left dominant. Both vertebral arteries are normal to their confluence with the basilar artery. Skeleton: No  acute abnormality. Other neck: Unremarkable Upper chest: No pulmonary nodule or pleural effusion. Review of the MIP images confirms the above findings CTA HEAD FINDINGS Anterior circulation: --Intracranial internal carotid arteries: Normal. --Anterior cerebral arteries: Normal. --Middle cerebral arteries: Normal. --Posterior communicating arteries: Small bilaterally. Posterior circulation: --Posterior cerebral arteries: Normal. --Superior cerebellar arteries: Normal. --Basilar artery: Normal. --Anterior inferior cerebellar arteries: Normal. --Posterior inferior cerebellar arteries: Normal. Venous sinuses: As permitted by contrast timing, patent. Anatomic variants: None Delayed phase: No parenchymal contrast enhancement Review of the MIP images confirms the above findings IMPRESSION: 1. No acute intracranial abnormality. 2. No intracranial occlusion or high-grade stenosis. 3. Bilateral proximal internal carotid artery narrowing, measuring 50-55%. 4. Severe stenosis of the proximal right subclavian artery. 5. Moderate narrowing of the left vertebral artery origin. 6. Aortic atherosclerosis. Electronically Signed   By: Deatra RobinsonKevin  Herman M.D.   On: 04/14/2016 18:47   Ct Angio Neck W And/or Wo  Contrast  Result Date: 04/14/2016 CLINICAL DATA:  Pain and aphasia EXAM: CT ANGIOGRAPHY HEAD AND NECK TECHNIQUE: Multidetector CT imaging of the head and neck was performed using the standard protocol during bolus administration of intravenous contrast. Multiplanar CT image reconstructions and MIPs were obtained to evaluate the vascular anatomy. Carotid stenosis measurements (when applicable) are obtained utilizing NASCET criteria, using the distal internal carotid diameter as the denominator. CONTRAST:  100 mL Isovue 370 IV COMPARISON:  None. FINDINGS: CT HEAD FINDINGS Brain: No mass lesion, intraparenchymal hemorrhage or extra-axial collection. No evidence of acute cortical infarct. There is periventricular hypoattenuation compatible with chronic microvascular disease. Vascular: Left vertebral artery calcification. No hyperdense vessel sign. Skull: Normal visualized skull base, calvarium and extracranial soft tissues. Sinuses/Orbits: Status post left mastoidectomy. Paranasal sinuses are clear. Normal orbits. Review of the MIP images confirms the above findings CTA NECK FINDINGS Aortic arch: There is a bovine variant aortic arch branching pattern with the brachiocephalic and left common carotid artery is sharing a common origin. There is atherosclerotic calcification within the aortic arch. There is severe stenosis at the origin of the right subclavian artery. Left subclavian artery is unremarkable. Right carotid system: There is atherosclerotic calcification at the bifurcation with stenosis measuring approximately 50%. The internal carotid artery follows a medial course at the level of the hypopharynx. Left carotid system: There is atherosclerotic calcification of the left carotid bifurcation with proximal internal carotid artery stenosis measuring 55%. The left internal carotid artery also follows a medial course of the level of the hypopharynx. Vertebral arteries: There is moderate narrowing of the left  vertebral artery origin. The right vertebral artery origin is normal. The vertebral artery systems is left dominant. Both vertebral arteries are normal to their confluence with the basilar artery. Skeleton: No acute abnormality. Other neck: Unremarkable Upper chest: No pulmonary nodule or pleural effusion. Review of the MIP images confirms the above findings CTA HEAD FINDINGS Anterior circulation: --Intracranial internal carotid arteries: Normal. --Anterior cerebral arteries: Normal. --Middle cerebral arteries: Normal. --Posterior communicating arteries: Small bilaterally. Posterior circulation: --Posterior cerebral arteries: Normal. --Superior cerebellar arteries: Normal. --Basilar artery: Normal. --Anterior inferior cerebellar arteries: Normal. --Posterior inferior cerebellar arteries: Normal. Venous sinuses: As permitted by contrast timing, patent. Anatomic variants: None Delayed phase: No parenchymal contrast enhancement Review of the MIP images confirms the above findings IMPRESSION: 1. No acute intracranial abnormality. 2. No intracranial occlusion or high-grade stenosis. 3. Bilateral proximal internal carotid artery narrowing, measuring 50-55%. 4. Severe stenosis of the proximal right subclavian artery. 5. Moderate narrowing of the left vertebral artery origin. 6.  Aortic atherosclerosis. Electronically Signed   By: Deatra Robinson M.D.   On: 04/14/2016 18:47    Procedures Procedures (including critical care time)  Medications Ordered in ED Medications  iopamidol (ISOVUE-370) 76 % injection (not administered)  vancomycin (VANCOCIN) IVPB 1000 mg/200 mL premix (not administered)  acyclovir (ZOVIRAX) 620 mg in dextrose 5 % 100 mL IVPB (not administered)  cefTRIAXone (ROCEPHIN) 2 g in dextrose 5 % 50 mL IVPB (not administered)  morphine 2 MG/ML injection 2 mg (2 mg Intravenous Given 04/14/16 1627)  ondansetron (ZOFRAN) injection 4 mg (4 mg Intravenous Given 04/14/16 1627)     Initial Impression /  Assessment and Plan / ED Course  I have reviewed the triage vital signs and the nursing notes.  Pertinent labs & imaging results that were available during my care of the patient were reviewed by me and considered in my medical decision making (see chart for details).  Clinical Course    Vitals:   04/14/16 1815 04/14/16 1830 04/14/16 1854 04/14/16 2014  BP: 181/86 161/85  163/93  Pulse: 85 84  82  Resp: 25 18  20   Temp:   98.6 F (37 C) 99.5 F (37.5 C)  TempSrc:   Oral Oral  SpO2: 97% 94%  95%    Medications  iopamidol (ISOVUE-370) 76 % injection (not administered)  vancomycin (VANCOCIN) IVPB 1000 mg/200 mL premix (not administered)  acyclovir (ZOVIRAX) 620 mg in dextrose 5 % 100 mL IVPB (not administered)  cefTRIAXone (ROCEPHIN) 2 g in dextrose 5 % 50 mL IVPB (not administered)  morphine 2 MG/ML injection 2 mg (2 mg Intravenous Given 04/14/16 1627)  ondansetron (ZOFRAN) injection 4 mg (4 mg Intravenous Given 04/14/16 1627)    Jerry Benson is 82 y.o. male presenting with Intermittent dysarthria and trouble word finding. Other numbness the neurologic exam is nonfocal, he does have difficulty following commands. But he is redirectable with coaching. He is reporting a headache and neck pain, this is not a code stroke. Onset of symptoms sometime between last night and when he woke up today. Patient takes Plavix, no recent falls or trauma. CT angio neck and head are negative, blood work otherwise reassuring.  Neurology consult from Dr. Roxy Manns appreciated: Has evaluated the patient and is unclear on the possible etiology, states this may be a small stroke, could be seizures or possible encephalitis. Would not recommend proceeding forward with LP based on his Plavix. Will start prophylactic antibiotics and will need a medical admission.  Discussed with Dr. Toniann Fail who accepts observation admission, request blood cultures and sedimentation rate.  This is a shared visit with the  attending physician who personally evaluated the patient and agrees with the care plan.    Final Clinical Impressions(s) / ED Diagnoses   Final diagnoses:  Word finding difficulty    New Prescriptions New Prescriptions   No medications on file     Wynetta Emery, PA-C 04/14/16 2023    Mancel Bale, MD 04/16/16 1315

## 2016-04-14 NOTE — ED Provider Notes (Signed)
  Face-to-face evaluation   History: He presents for evaluation of garbled speech, confusion and head and neck pain, which apparently started yesterday evening. His wife noticed it today. His family members called EMS. We transferred her here. He takes Plavix for atrial fibrillation.   Physical exam: Elderly man. Occasional word finding problems with somewhat garbled speech, and periods of clear speech. Able to extend arms and legs off the stretcher independently, without dysmetria. Pupils equal, round, reactive to light.  This patients CHA2DS2-VASc Score and unadjusted Ischemic Stroke Rate (% per year) is equal to 9.7 % stroke rate/year from a score of 6  Above score calculated as 1 point each if present [CHF, HTN, DM, Vascular=MI/PAD/Aortic Plaque, Age if 65-74, or Male] Above score calculated as 2 points each if present [Age > 75, or Stroke/TIA/TE]       Medical screening examination/treatment/procedure(s) were conducted as a shared visit with non-physician practitioner(s) and myself.  I personally evaluated the patient during the encounter   Mancel BaleElliott Lilymarie Scroggins, MD 04/16/16 1315

## 2016-04-14 NOTE — Progress Notes (Signed)
Patient arrived via ED stretcher with belongings and family at bedside.  Vitals taken, tele placed and verified. Continue to monitor.

## 2016-04-14 NOTE — ED Notes (Signed)
Waiting antibiotics from pharmacy.

## 2016-04-14 NOTE — H&P (Signed)
History and Physical    Jerry Benson ZOX:[id number] DOB: [birth year] DOA: 04/14/2016  PCP: Ronny Flurry, MD  Patient coming from: Home  Chief Complaint: Confusion and difficulty speaking  HPI: Jerry Benson is a 82 y.o. male with history of CAD, HTN, DM, and dementia was noticed to be confused and having difficulty expressing words since this morning by the family. As per patient's grandson, patient did tell him about the word difficulty last night. Patient also had associated occipital and frontal headache, which is new, with no associated nausea, vomiting, blurred vision, or any weakness of the extremities. Patient was taken to his PCP and was referred to the ER. Neurology was consulted. Patient had CT head and Ct angiogram of the head and neck , which were unremarkable. Headache improved with morphine. On my exam, patient appears non focal, follows commands, is oriented to name, and is able to recognize his grandson.    ED Course: See HPI. CT head was unremarkable. Followed by CT angiogram of head and neck ,which were also unremarkable. Neuorlogy was consulted.   Review of Systems: As per HPI, rest all negative.   Past Medical History:  Diagnosis Date  . Chest pain   . CHF (congestive heart failure) (HCC)   . CKD (chronic kidney disease) stage 3, GFR 30-59 ml/min   . Coronary artery disease    a. Remote MI 1981;  b. Prior CABG per Dr. Hendricks Milo in 1981;  c. redo CABG in 2009;  d. PCI to SVG to DX and OM in 2010;  e. PCI/DES VG-> OM (ISR) w/ 2.75x33mm Resolute DES Mar. 2013  . Diabetes mellitus   . GERD (gastroesophageal reflux disease)   . Hyperlipidemia   . Hypertension   . Status post insertion of non-drug eluting coronary artery stent 10/05/2008    Past Surgical History:  Procedure Laterality Date  . CARDIAC CATHETERIZATION  10/11/2008   PCI to SVG to diagonal and OM in 2010  . CORONARY ARTERY BYPASS GRAFT     original surgery in in 1981 with redo surgery in 2009. His  original surgery was by Dr. Hendricks Milo and he had a SVG to the RCA, SVG to DX/OM, and SVG to DX/LAD; Redo surgery per Dr. Laneta Simmers in 2009 with LIMA to LAD and SVG to PD  . LEFT HEART CATHETERIZATION WITH CORONARY/GRAFT ANGIOGRAM N/A 09/18/2011   Procedure: LEFT HEART CATHETERIZATION WITH Isabel Caprice;  Surgeon: Herby Abraham, MD;  Location: [facility] CATH LAB;  Service: Cardiovascular;  Laterality: N/A;  . PERCUTANEOUS CORONARY STENT INTERVENTION (PCI-S) N/A 09/21/2011   Procedure: PERCUTANEOUS CORONARY STENT INTERVENTION (PCI-S);  Surgeon: Herby Abraham, MD;  Location: [facility] CATH LAB;  Service: Cardiovascular;  Laterality: N/A;     reports that he has never smoked. He has never used smokeless tobacco. He reports that he does not drink alcohol or use drugs.  Allergies  Allergen Reactions  . Amlodipine Other (See Comments)    headache  . Lipitor [Atorvastatin Calcium] Other (See Comments)    UNKNOWN BY PATIENT  . Lisinopril Cough    Rash / cough. However patient is currently taking Lotensin with no problems    Family History  Problem Relation Age of Onset  . Heart disease Father   . Heart attack Father   . Heart disease Mother   . Stroke Daughter     09/2008  . Heart attack Brother     Prior to Admission medications   Medication Sig Start Date End  Date Taking? Authorizing Provider  aspirin 81 MG tablet Take 81 mg by mouth daily.     Yes Historical Provider, MD  benazepril (LOTENSIN) 20 MG tablet Take 1 tablet (20 mg total) by mouth daily. 11/25/15  Yes Corky Crafts, MD  clopidogrel (PLAVIX) 75 MG tablet TAKE 1 TABLET BY MOUTH DAILY 07/17/15  Yes Cassell Clement, MD  glipiZIDE (GLUCOTROL) 10 MG tablet Take 1 tablet (10 mg total) by mouth daily before breakfast. 11/19/15  Yes Corky Crafts, MD  isosorbide dinitrate (ISORDIL) 20 MG tablet TAKE 1 TABLET BY MOUTH THREE TIMES DAILY 08/21/15  Yes Cassell Clement, MD  metFORMIN (GLUCOPHAGE) 500 MG tablet TAKE 1 TABLET BY  MOUTH THREE TIMES DAILY WITH MEALS 08/16/15  Yes Cassell Clement, MD  metoprolol (LOPRESSOR) 50 MG tablet TAKE 1/2 TABLET BY MOUTH TWICE DAILY 02/12/16  Yes Corky Crafts, MD  Multiple Vitamins-Iron (MULTIVITAMIN/IRON PO) Take 1 tablet by mouth daily.    Yes Historical Provider, MD  nitroGLYCERIN (NITROSTAT) 0.4 MG SL tablet PLACE 1 TABLET ONE TABLET UNDER TONGUE AS NEEDED FOR CHEST PAIN 03/16/16  Yes Corky Crafts, MD  rosuvastatin (CRESTOR) 10 MG tablet TAKE 1/2 TABLET BY MOUTH EVERY DAY Patient taking differently: TAKE 1 TABLET BY MOUTH EVERY DAY 07/17/15  Yes Cassell Clement, MD  donepezil (ARICEPT) 5 MG tablet Take 1 tablet (5 mg total) by mouth at bedtime. Patient not taking: Reported on 04/14/2016 05/28/14   Cassell Clement, MD  famotidine (PEPCID) 40 MG tablet Take 1 tablet (40 mg total) by mouth daily. Patient not taking: Reported on 04/14/2016 08/29/15   Cassell Clement, MD    Physical Exam: Vitals:   04/14/16 1815 04/14/16 1830 04/14/16 1854 04/14/16 2014  BP: 181/86 161/85  163/93  Pulse: 85 84  82  Resp: 25 18  20   Temp:   98.6 F (37 C) 99.5 F (37.5 C)  TempSrc:   Oral Oral  SpO2: 97% 94%  95%    Constitutional: Appears calm and comfortable, moderately built  Eyes: Anicteric, no pallor  ENMT: No discharge from the ears, eyes, nose, and mouth. Neck: No JVD, no neck rigidity. No mass. Respiratory: CTA, no wheezes or rhonchi Cardiovascular: No murmurs, rubs, or gallops  Abdomen: Soft, non tender, and non distended  Musculoskeletal: good ROM, moves all extremities  Skin: No rashes, lesions, or ulcers  Neurological:  Alert, awake, oriented to name and place. Moves all extremities 5/5. PERLLA positive. No facial asymmetry.  Psychiatric: Normal affect. Has mild difficulty recalling things.     Vitals:   04/14/16 1815 04/14/16 1830 04/14/16 1854 04/14/16 2014  BP: 181/86 161/85  163/93  Pulse: 85 84  82  Resp: 25 18  20   Temp:   98.6 F (37 C) 99.5 F  (37.5 C)  TempSrc:   Oral Oral  SpO2: 97% 94%  95%    Labs on Admission: I have personally reviewed following labs and imaging studies  CBC:  Recent Labs Lab 04/14/16 1643 04/14/16 1706  WBC 9.5  --   NEUTROABS 7.9*  --   HGB 13.5 14.3  HCT 40.9 42.0  MCV 89.3  --   PLT 124*  --    Basic Metabolic Panel:  Recent Labs Lab 04/14/16 1643 04/14/16 1706  NA 139 138  K 3.9 4.6  CL 104 102  CO2 26  --   GLUCOSE 91 86  BUN 13 22*  CREATININE 1.01 0.90  CALCIUM 9.9  --    GFR:  CrCl cannot be calculated (Unknown ideal weight.). Liver Function Tests:  Recent Labs Lab 04/14/16 1643  AST 25  ALT 14*  ALKPHOS 58  BILITOT 1.4*  PROT 6.8  ALBUMIN 4.5   No results for input(s): LIPASE, AMYLASE in the last 168 hours. No results for input(s): AMMONIA in the last 168 hours. Coagulation Profile:  Recent Labs Lab 04/14/16 1643  INR 1.01   Cardiac Enzymes: No results for input(s): CKTOTAL, CKMB, CKMBINDEX, TROPONINI in the last 168 hours. BNP (last 3 results) No results for input(s): PROBNP in the last 8760 hours. HbA1C: No results for input(s): HGBA1C in the last 72 hours. CBG:  Recent Labs Lab 04/14/16 1559  GLUCAP 104*   Lipid Profile: No results for input(s): CHOL, HDL, LDLCALC, TRIG, CHOLHDL, LDLDIRECT in the last 72 hours. Thyroid Function Tests: No results for input(s): TSH, T4TOTAL, FREET4, T3FREE, THYROIDAB in the last 72 hours. Anemia Panel: No results for input(s): VITAMINB12, FOLATE, FERRITIN, TIBC, IRON, RETICCTPCT in the last 72 hours. Urine analysis:    Component Value Date/Time   COLORURINE YELLOW 04/14/2016 1622   APPEARANCEUR CLEAR 04/14/2016 1622   LABSPEC 1.010 04/14/2016 1622   PHURINE 7.5 04/14/2016 1622   GLUCOSEU NEGATIVE 04/14/2016 1622   HGBUR NEGATIVE 04/14/2016 1622   BILIRUBINUR NEGATIVE 04/14/2016 1622   KETONESUR NEGATIVE 04/14/2016 1622   PROTEINUR 30 (A) 04/14/2016 1622   NITRITE NEGATIVE 04/14/2016 1622    LEUKOCYTESUR NEGATIVE 04/14/2016 1622   Sepsis Labs: @LABRCNTIP (procalcitonin:4,lacticidven:4) )No results found for this or any previous visit (from the past 240 hour(s)).   Radiological Exams on Admission: Ct Angio Head W Or Wo Contrast  Result Date: 04/14/2016 CLINICAL DATA:  Pain and aphasia EXAM: CT ANGIOGRAPHY HEAD AND NECK TECHNIQUE: Multidetector CT imaging of the head and neck was performed using the standard protocol during bolus administration of intravenous contrast. Multiplanar CT image reconstructions and MIPs were obtained to evaluate the vascular anatomy. Carotid stenosis measurements (when applicable) are obtained utilizing NASCET criteria, using the distal internal carotid diameter as the denominator. CONTRAST:  100 mL Isovue 370 IV COMPARISON:  None. FINDINGS: CT HEAD FINDINGS Brain: No mass lesion, intraparenchymal hemorrhage or extra-axial collection. No evidence of acute cortical infarct. There is periventricular hypoattenuation compatible with chronic microvascular disease. Vascular: Left vertebral artery calcification. No hyperdense vessel sign. Skull: Normal visualized skull base, calvarium and extracranial soft tissues. Sinuses/Orbits: Status post left mastoidectomy. Paranasal sinuses are clear. Normal orbits. Review of the MIP images confirms the above findings CTA NECK FINDINGS Aortic arch: There is a bovine variant aortic arch branching pattern with the brachiocephalic and left common carotid artery is sharing a common origin. There is atherosclerotic calcification within the aortic arch. There is severe stenosis at the origin of the right subclavian artery. Left subclavian artery is unremarkable. Right carotid system: There is atherosclerotic calcification at the bifurcation with stenosis measuring approximately 50%. The internal carotid artery follows a medial course at the level of the hypopharynx. Left carotid system: There is atherosclerotic calcification of the left  carotid bifurcation with proximal internal carotid artery stenosis measuring 55%. The left internal carotid artery also follows a medial course of the level of the hypopharynx. Vertebral arteries: There is moderate narrowing of the left vertebral artery origin. The right vertebral artery origin is normal. The vertebral artery systems is left dominant. Both vertebral arteries are normal to their confluence with the basilar artery. Skeleton: No acute abnormality. Other neck: Unremarkable Upper chest: No pulmonary nodule or pleural effusion. Review of  the MIP images confirms the above findings CTA HEAD FINDINGS Anterior circulation: --Intracranial internal carotid arteries: Normal. --Anterior cerebral arteries: Normal. --Middle cerebral arteries: Normal. --Posterior communicating arteries: Small bilaterally. Posterior circulation: --Posterior cerebral arteries: Normal. --Superior cerebellar arteries: Normal. --Basilar artery: Normal. --Anterior inferior cerebellar arteries: Normal. --Posterior inferior cerebellar arteries: Normal. Venous sinuses: As permitted by contrast timing, patent. Anatomic variants: None Delayed phase: No parenchymal contrast enhancement Review of the MIP images confirms the above findings IMPRESSION: 1. No acute intracranial abnormality. 2. No intracranial occlusion or high-grade stenosis. 3. Bilateral proximal internal carotid artery narrowing, measuring 50-55%. 4. Severe stenosis of the proximal right subclavian artery. 5. Moderate narrowing of the left vertebral artery origin. 6. Aortic atherosclerosis. Electronically Signed   By: Deatra RobinsonKevin  Herman M.D.   On: 04/14/2016 18:47   Ct Angio Neck W And/or Wo Contrast  Result Date: 04/14/2016 CLINICAL DATA:  Pain and aphasia EXAM: CT ANGIOGRAPHY HEAD AND NECK TECHNIQUE: Multidetector CT imaging of the head and neck was performed using the standard protocol during bolus administration of intravenous contrast. Multiplanar CT image reconstructions  and MIPs were obtained to evaluate the vascular anatomy. Carotid stenosis measurements (when applicable) are obtained utilizing NASCET criteria, using the distal internal carotid diameter as the denominator. CONTRAST:  100 mL Isovue 370 IV COMPARISON:  None. FINDINGS: CT HEAD FINDINGS Brain: No mass lesion, intraparenchymal hemorrhage or extra-axial collection. No evidence of acute cortical infarct. There is periventricular hypoattenuation compatible with chronic microvascular disease. Vascular: Left vertebral artery calcification. No hyperdense vessel sign. Skull: Normal visualized skull base, calvarium and extracranial soft tissues. Sinuses/Orbits: Status post left mastoidectomy. Paranasal sinuses are clear. Normal orbits. Review of the MIP images confirms the above findings CTA NECK FINDINGS Aortic arch: There is a bovine variant aortic arch branching pattern with the brachiocephalic and left common carotid artery is sharing a common origin. There is atherosclerotic calcification within the aortic arch. There is severe stenosis at the origin of the right subclavian artery. Left subclavian artery is unremarkable. Right carotid system: There is atherosclerotic calcification at the bifurcation with stenosis measuring approximately 50%. The internal carotid artery follows a medial course at the level of the hypopharynx. Left carotid system: There is atherosclerotic calcification of the left carotid bifurcation with proximal internal carotid artery stenosis measuring 55%. The left internal carotid artery also follows a medial course of the level of the hypopharynx. Vertebral arteries: There is moderate narrowing of the left vertebral artery origin. The right vertebral artery origin is normal. The vertebral artery systems is left dominant. Both vertebral arteries are normal to their confluence with the basilar artery. Skeleton: No acute abnormality. Other neck: Unremarkable Upper chest: No pulmonary nodule or pleural  effusion. Review of the MIP images confirms the above findings CTA HEAD FINDINGS Anterior circulation: --Intracranial internal carotid arteries: Normal. --Anterior cerebral arteries: Normal. --Middle cerebral arteries: Normal. --Posterior communicating arteries: Small bilaterally. Posterior circulation: --Posterior cerebral arteries: Normal. --Superior cerebellar arteries: Normal. --Basilar artery: Normal. --Anterior inferior cerebellar arteries: Normal. --Posterior inferior cerebellar arteries: Normal. Venous sinuses: As permitted by contrast timing, patent. Anatomic variants: None Delayed phase: No parenchymal contrast enhancement Review of the MIP images confirms the above findings IMPRESSION: 1. No acute intracranial abnormality. 2. No intracranial occlusion or high-grade stenosis. 3. Bilateral proximal internal carotid artery narrowing, measuring 50-55%. 4. Severe stenosis of the proximal right subclavian artery. 5. Moderate narrowing of the left vertebral artery origin. 6. Aortic atherosclerosis. Electronically Signed   By: Deatra RobinsonKevin  Herman M.D.   On: 04/14/2016  18:47    EKG: Independently reviewed. Normal sinus rhythm, LVH, nonspecific ST changes.  Assessment/Plan Active Problems:   Word finding difficulty    1. Acute anchephalopathy with expressive aphasia - Appreciate neurolgoy consult. Among the differentials, stroke for which we will get MRI brain. Since patient also had a headache, neurology recommended antibiotics and antivirals. Unable to do LP because patient is on plavix. Will check sed rate and blood cultures. EEG, ammonia levels, B 12, thyroid function, and further recommendations based on tests ordered. 2. CAD - Continue aspirin and Plavix. If patient can take orally, continue metoprolol. Otherwise, will be changing metoprolol to IV. On statins. 3. HTN - Will continue present medications. llow for permissive hypertension until MRI negative for stroke. 4. Dementia - On  Aricept. 5. Diabetes Mellitus type 2  - Keep patient on sliding scale insulin until he can take orals reliably.   DVT prophylaxis: SCDs Code Status: Full code Family Communication: Grandson at bedside Disposition Plan: Home Consults called: Neurology Admission status: Observation   Queen Slough, MD  [facility] Pager [phone].  If 7PM-7AM, please contact night-coverage [email or web address] Password [email or web address]  04/14/2016, 8:40 PM   By signing my name below, I, Nikki Dom, attest that this documentation has been prepared under the direction and in the presence of Midge Minium, MD. Electronically signed: Nikki Dom, Scribe. 04/14/16 8:47 PM

## 2016-04-14 NOTE — Consult Note (Signed)
Neurology Consult Note  Reason for Consultation: Abnormal speech  Requesting provider: Wynetta Emery, PA  CC: Headache  HPI: This is an 82 year old right-handed man was brought to the emergency department by family after that he noticed that he was having some difficulty speaking and appeared to be more confused. The patient appears to be encephalopathic and has a baseline dementia, making him a limited historian. His history is supplemented by information provided by his grandson who is present at the bedside.  The patient's main complaint right now is headache. He indicates pain at the base of his skull. He says that the pain does not increase with movement of the head or neck. He denies radiation of the pain. He has tenderness with palpation of the area but this seems to improve with gentle massage. He denies any associated nausea or vomiting. He states that he has had headaches in the past but feels like this one is worse than usual. Otherwise he does not offer any specific details regarding the headache.  His grandson reports that the family has noticed that the patient has been having difficulty expressing himself today. He says that the patient first himself told him that he noticed this sometime last night before he went to bed. However, his wife did not recognize any problems until this morning when he woke up around 10:00. His grandson describes that normally his speech is quite fluent and clear, though he does have significant memory problems related to his dementia. Today, however, he has struggled to come up with words. At one point earlier in the afternoon, his grandson reports that he repeated the same 2 words over and over again for about 20 minutes when he is trying to answer a question. Overall, though, his grandson feels like his speech problems have improved over the course of the day. Now they seem to wax and wane. They have not appreciated any focal weakness. His grandson reports  that he has had problems with frequent falls over the past several months, so reported abnormalities with his gait are not necessarily new. However, there is no report of a fall last night or today.  As noted, the patient has a ground diagnosis of dementia for which he takes donepezil. His grandson reports that his memory is quite poor. He says that although the patient has lived on the same [address] for most of his life, he now has difficulty recognizing his surroundings. It sounds as if he also requires assistance with some of his activities of daily living, in part because of unsteadiness with gait and in part because of memory trouble.  Pertinent labs in the ED: Serum ethanol less than 5 CMP notable for total bilirubin 1.4 Troponin 0.01 CBC notable for platelets 124,000 PTT 31 INR 1.01 Urinalysis notable for protein 30 Urine drug screen negative  PMH:  Past Medical History:  Diagnosis Date  . Chest pain   . CHF (congestive heart failure) (HCC)   . CKD (chronic kidney disease) stage 3, GFR 30-59 ml/min   . Coronary artery disease    a. Remote MI 1981;  b. Prior CABG per Dr. Hendricks Milo in 1981;  c. redo CABG in 2009;  d. PCI to SVG to DX and OM in 2010;  e. PCI/DES VG-> OM (ISR) w/ 2.75x99mm Resolute DES Mar. 2013  . Diabetes mellitus   . GERD (gastroesophageal reflux disease)   . Hyperlipidemia   . Hypertension   . Status post insertion of non-drug eluting coronary artery stent  10/05/2008    PSH:  Past Surgical History:  Procedure Laterality Date  . CARDIAC CATHETERIZATION  10/11/2008   PCI to SVG to diagonal and OM in 2010  . CORONARY ARTERY BYPASS GRAFT     original surgery in in 1981 with redo surgery in 2009. His original surgery was by Dr. Hendricks Milo and he had a SVG to the RCA, SVG to DX/OM, and SVG to DX/LAD; Redo surgery per Dr. Laneta Simmers in 2009 with LIMA to LAD and SVG to PD  . LEFT HEART CATHETERIZATION WITH CORONARY/GRAFT ANGIOGRAM N/A 09/18/2011   Procedure: LEFT  HEART CATHETERIZATION WITH Isabel Caprice;  Surgeon: Herby Abraham, MD;  Location: [facility] CATH LAB;  Service: Cardiovascular;  Laterality: N/A;  . PERCUTANEOUS CORONARY STENT INTERVENTION (PCI-S) N/A 09/21/2011   Procedure: PERCUTANEOUS CORONARY STENT INTERVENTION (PCI-S);  Surgeon: Herby Abraham, MD;  Location: [facility] CATH LAB;  Service: Cardiovascular;  Laterality: N/A;    Family history: Family History  Problem Relation Age of Onset  . Heart disease Father   . Heart attack Father   . Heart disease Mother   . Stroke Daughter     09/2008  . Heart attack Brother     Social history:  Social History   Social History  . Marital status: Married    Spouse name: N/A  . Number of children: N/A  . Years of education: N/A   Occupational History  . Not on file.   Social History Main Topics  . Smoking status: Never Smoker  . Smokeless tobacco: Never Used  . Alcohol use No  . Drug use: No  . Sexual activity: No   Other Topics Concern  . Not on file   Social History Narrative  . No narrative on file    Current outpatient meds: Current Meds  Medication Sig  . aspirin 81 MG tablet Take 81 mg by mouth daily.    . benazepril (LOTENSIN) 20 MG tablet Take 1 tablet (20 mg total) by mouth daily.  . clopidogrel (PLAVIX) 75 MG tablet TAKE 1 TABLET BY MOUTH DAILY  . glipiZIDE (GLUCOTROL) 10 MG tablet Take 1 tablet (10 mg total) by mouth daily before breakfast.  . isosorbide dinitrate (ISORDIL) 20 MG tablet TAKE 1 TABLET BY MOUTH THREE TIMES DAILY  . metFORMIN (GLUCOPHAGE) 500 MG tablet TAKE 1 TABLET BY MOUTH THREE TIMES DAILY WITH MEALS  . metoprolol (LOPRESSOR) 50 MG tablet TAKE 1/2 TABLET BY MOUTH TWICE DAILY  . Multiple Vitamins-Iron (MULTIVITAMIN/IRON PO) Take 1 tablet by mouth daily.   . nitroGLYCERIN (NITROSTAT) 0.4 MG SL tablet PLACE 1 TABLET ONE TABLET UNDER TONGUE AS NEEDED FOR CHEST PAIN  . rosuvastatin (CRESTOR) 10 MG tablet TAKE 1/2 TABLET BY MOUTH EVERY DAY (Patient  taking differently: TAKE 1 TABLET BY MOUTH EVERY DAY)  . [DISCONTINUED] metoprolol tartrate (LOPRESSOR) 25 MG tablet Take 12.5 mg by mouth 2 (two) times daily.    Current inpatient meds:  Current Facility-Administered Medications  Medication Dose Route Frequency Provider Last Rate Last Dose  . acyclovir (ZOVIRAX) 620 mg in dextrose 5 % 100 mL IVPB  10 mg/kg (Order-Specific) Intravenous Once [employer], PA-C      . cefTRIAXone (ROCEPHIN) 1 g in dextrose 5 % 50 mL IVPB  1 g Intravenous Once [employer], PA-C      . iopamidol (ISOVUE-370) 76 % injection           . vancomycin (VANCOCIN) IVPB 1000 mg/200 mL premix  1,000 mg Intravenous Once Joni Reining  Pisciotta, PA-C       Current Outpatient Prescriptions  Medication Sig Dispense Refill  . aspirin 81 MG tablet Take 81 mg by mouth daily.      . benazepril (LOTENSIN) 20 MG tablet Take 1 tablet (20 mg total) by mouth daily. 30 tablet 11  . clopidogrel (PLAVIX) 75 MG tablet TAKE 1 TABLET BY MOUTH DAILY 90 tablet 3  . glipiZIDE (GLUCOTROL) 10 MG tablet Take 1 tablet (10 mg total) by mouth daily before breakfast. 30 tablet 6  . isosorbide dinitrate (ISORDIL) 20 MG tablet TAKE 1 TABLET BY MOUTH THREE TIMES DAILY 90 tablet 10  . metFORMIN (GLUCOPHAGE) 500 MG tablet TAKE 1 TABLET BY MOUTH THREE TIMES DAILY WITH MEALS 270 tablet 0  . metoprolol (LOPRESSOR) 50 MG tablet TAKE 1/2 TABLET BY MOUTH TWICE DAILY 90 tablet 2  . Multiple Vitamins-Iron (MULTIVITAMIN/IRON PO) Take 1 tablet by mouth daily.     . nitroGLYCERIN (NITROSTAT) 0.4 MG SL tablet PLACE 1 TABLET ONE TABLET UNDER TONGUE AS NEEDED FOR CHEST PAIN 25 tablet 3  . rosuvastatin (CRESTOR) 10 MG tablet TAKE 1/2 TABLET BY MOUTH EVERY DAY (Patient taking differently: TAKE 1 TABLET BY MOUTH EVERY DAY) 45 tablet 3  . donepezil (ARICEPT) 5 MG tablet Take 1 tablet (5 mg total) by mouth at bedtime. (Patient not taking: Reported on 04/14/2016) 30 tablet 5  . famotidine (PEPCID) 40 MG tablet Take 1  tablet (40 mg total) by mouth daily. (Patient not taking: Reported on 04/14/2016) 90 tablet 3    Allergies: Allergies  Allergen Reactions  . Amlodipine Other (See Comments)    headache  . Lipitor [Atorvastatin Calcium] Other (See Comments)    UNKNOWN BY PATIENT  . Lisinopril Cough    Rash / cough. However patient is currently taking Lotensin with no problems    ROS: As per HPI. A full 14-point review of systems was performed and is otherwise unremarkable.This may not be completely reliable given the patient's encephalopathy. He has had frequent urination today in the emergency department.  PE:  BP 161/85   Pulse 84   Temp 98.6 F (37 C) (Oral)   Resp 18   SpO2 94%   General: W[facility] [place] ED gurney. He is intermittently restless but redirectable. He is alert, oriented to self only. Speech is notable for mild dysarthria. He has frequent difficulties with word finding and at times appears dysfluent. Comprehension, repetition, and naming appear to be intact for the most part. Sustained attention, however, is limited. Comportment is normal.  HEENT: Normocephalic. Neck supple without LAD. He has some mild tenderness to palpation around the occipital protuberance. The greater occipital nerves are nontender. MM slightly dry, OP clear. Sclerae anicteric. No conjunctival injection.  CV: Regular, no murmur. Carotid pulses full and symmetric, no bruits. Distal pulses 2+ and symmetric.  Lungs: CTAB.  Abdomen: Soft, non-distended, non-tender. Bowel sounds present x4.  Extremities: No C/C/E. Neuro: This is somewhat limited by poor sustained attention. CN: Pupils are equal and round. They are symmetrically reactive from 3-->2 mm. visual fields appear to be full to confrontation. EOMI notable for breakup of smooth pursuits without nystagmus. Facial sensation appears to be intact to light touch. Face is symmetric at rest with normal strength and mobility. Hearing is intact to conversational voice. Palate  elevates symmetrically and uvula is midline. Voice is normal in tone, pitch and quality. Bilateral SCM and trapezii are 5/5. Tongue is midline with normal bulk and mobility.  Motor: Normal bulk for age.  He has mitgehen paratonia with normal underlying tone. Confrontational strength testing appears to be normal. No tremor or other abnormal movements. No drift.  Sensation: Appears to be intact to light touch but limited by attention deficits.  DTRs: 2+, symmetric. Toes downgoing bilaterally. Several frontal release signs are present.  Coordination: Finger-to-nose is without dysmetria. Finger taps are slow but symmetric, no decrement.     Labs:  Lab Results  Component Value Date   WBC 9.5 04/14/2016   HGB 14.3 04/14/2016   HCT 42.0 04/14/2016   PLT 124 (L) 04/14/2016   GLUCOSE 86 04/14/2016   CHOL 127 11/19/2015   TRIG 98 11/19/2015   HDL 45 11/19/2015   LDLCALC 62 11/19/2015   ALT 14 (L) 04/14/2016   AST 25 04/14/2016   NA 138 04/14/2016   K 4.6 04/14/2016   CL 102 04/14/2016   CREATININE 0.90 04/14/2016   BUN 22 (H) 04/14/2016   CO2 26 04/14/2016   TSH 1.79 11/16/2011   INR 1.01 04/14/2016   HGBA1C 7.4 (H) 11/19/2015   Serum ethanol less than 5 Troponin 0.01 PTT 31 Urinalysis notable for protein 30 Urine drug screen negative  Imaging:  I have personally and independently reviewed the CTH without contrast from today. This shows moderate diffuse generalized atrophy. A moderate burden of chronic small vessel ischemic change is noted in the bihemispheric white matter. CT pathology is present. There are atherosclerotic calcifications within the intracranial vessels.  I have personally and independently reviewed CT angiogram of the head and neck from today. Other do not appear to be any significant stenoses or occlusions in the intracranial vessels. There is mild proximal stenosis of both internal carotid arteries, measured by radiology at 50-55 percent. There is also significant  stenosis in the proximal segment of the right subclavian artery and the left vertebral artery. Moderate diffuse atherosclerotic changes are noted.  Impression: 1. Acute encephalopathy: The etiology of his symptoms today is unclear. On examination, while he does appear to have some difficulty with word finding and fluency, I'm not certain that this is a true aphasia given that repetition and naming are intact. His restlessness and poor sustained attention would support encephalopathy. He has a baseline dementia which places him at increased risk of encephalopathy from all causes and which would likely result in delayed recovery from same. Main considerations at this time would include unwitnessed fall with a blow to his head resulting in concussion/postconcussive symptoms, possible ischemic infarction, possible unwitnessed seizure, and possible CNS infection given complaints of headache. There are no obvious metabolic derangements on labs so far that would explain his presentation. Urinalysis was clear, excluding urinary tract infection. Generally I would recommend lumbar puncture in this case to exclude CNS infection, particularly given that meningitis in the elderly requires a high index of suspicion as it often presents as confusion without fever, meningismus, or other peripheral manifestations. Unfortunately, he is on Plavix for his underlying cardiac disease. This would need to be discontinued for several days before lumbar puncture could be safely performed. I am not sure that the risk of discontinuation outweighs the benefits at this point. I would favor empiric antibiotic therapy to cover possible infection for now.   Recommendations:   Empiric antibiotic coverage with ceftriaxone 2 g IV every 12 hours, acyclovir 10 mg/kg IV every 8 hours, vancomycin with weight-based dosing, and ampicillin 2 g IV every 4 hours  Check serum ammonia, TSH, free T4, vitamin B12, RPR  MRI brain with and without  contrast; hopefully this can be obtained without significant sedation  EEG in a.m.  Avoid CNS active medications, specifically benzodiazepines, opiates, and anything with strong anticholinergic properties  Optimize circadian rhythms to preserve sleep-wake cycles as much as possible, keeping his room bright and active during the day and quiet and dark at night; care should be clustered at night as much as possible to minimize nighttime awakenings  This was discussed with the patient's grandson at the bedside. He is in agreement with the plan as noted. He was given the opportunity to ask any questions and these were addressed to his satisfaction.  I also discussed my impression with Ms. Lynetta Mare at the time of my consultation.  Thank you for the opportunity to participate in this patient's care. Please feel free to call any questions or concerns. Neurology will continue to follow with you.

## 2016-04-14 NOTE — ED Notes (Signed)
Patient presented to the ed for waxing and waning of altered mental status with slurred speech, the patient will speak in full sentences one minute and the next have garbled speech that you cannot understand.  The patient was seen by neuro and will be admitted for mri and eeg.  Pt passed swallow screen. Patient is alert and oriented at this time. Complaints of vague pain in his lower head and neck and is receiving antibiotics precautionary but does not need to be on isolation per er pa. Phone number [phone]

## 2016-04-14 NOTE — Progress Notes (Signed)
Pharmacy Antibiotic Note  87 YOM to start empiric vancomycin per pharmacy for r/o meningitis. Neuro following. CT angiogram of head and neck unremarkable. Deferring LP for now as pt is on plavix PTA. Plan for MRI brain and EEG in AM. HIV, RPR, and ammonia levels pending. Pt currently afebrile, WBC WNL, SCr 0.90.    Plan: -Vancomycin 1250mg  x1 followed 500mg  q12h -Continue Rocephin 2g q12h -Continue ampicillin 2g q4h - may need to adjust if renal fxn worsens -Continue acyclovir 10mg /kg q8h -F/U cultures, MRI, LP when appropriate -Monitor renal fxn, clinical course, LOT   Height: 5\' 4"  (162.6 cm) Weight: 130 lb 8.2 oz (59.2 kg) IBW/kg (Calculated) : 59.2  Temp (24hrs), Avg:99.2 F (37.3 C), Min:98.6 F (37 C), Max:99.6 F (37.6 C)   Recent Labs Lab 04/14/16 1643 04/14/16 1706  WBC 9.5  --   CREATININE 1.01 0.90    Estimated Creatinine Clearance: 50.2 mL/min (by C-G formula based on SCr of 0.9 mg/dL).    Allergies  Allergen Reactions  . Amlodipine Other (See Comments)    headache  . Lipitor [Atorvastatin Calcium] Other (See Comments)    UNKNOWN BY PATIENT  . Lisinopril Cough    Rash / cough. However patient is currently taking Lotensin with no problems    Antimicrobials this admission: Vancomycin 10/10 >> Rocephin 10/10 >> Ampicillin 10/10 >> Acyclovir 10/10 >>  Microbiology results: 10/10 RUE:[id number]  Thank you for allowing pharmacy to be a part of this patient's care.  Sherle Poeob Vincent, PharmD Clinical Pharmacist 9:54 PM, 04/14/2016

## 2016-04-14 NOTE — ED Triage Notes (Signed)
Family reports that the patient's wife found him to be acting abnormal last night. When he woke up today family reports he was considerably more altered. His speech appears much slower than usual, he is disoriented, and he is reporting a headache. Patient on blood thinners and has a history of afib. Pt taken to primary care who then called EMS. BP with EMS was 190/110.

## 2016-04-15 ENCOUNTER — Observation Stay (HOSPITAL_COMMUNITY): Payer: Medicare Other

## 2016-04-15 ENCOUNTER — Observation Stay (HOSPITAL_BASED_OUTPATIENT_CLINIC_OR_DEPARTMENT_OTHER)
Admit: 2016-04-15 | Discharge: 2016-04-15 | Disposition: A | Discharge status: Home / Self Care | Payer: Medicare Other | Attending: Neurology | Admitting: Neurology

## 2016-04-15 DIAGNOSIS — Z951 Presence of aortocoronary bypass graft: Secondary | ICD-10-CM | POA: Diagnosis not present

## 2016-04-15 DIAGNOSIS — I10 Essential (primary) hypertension: Secondary | ICD-10-CM | POA: Diagnosis not present

## 2016-04-15 DIAGNOSIS — G9340 Encephalopathy, unspecified: Secondary | ICD-10-CM | POA: Diagnosis not present

## 2016-04-15 DIAGNOSIS — N183 Chronic kidney disease, stage 3 (moderate): Secondary | ICD-10-CM | POA: Diagnosis not present

## 2016-04-15 LAB — COMPREHENSIVE METABOLIC PANEL
ALT: 13 U/L — AB (ref 17–63)
AST: 24 U/L (ref 15–41)
Albumin: 3.7 g/dL (ref 3.5–5.0)
Alkaline Phosphatase: 50 U/L (ref 38–126)
Anion gap: 10 (ref 5–15)
BILIRUBIN TOTAL: 1.1 mg/dL (ref 0.3–1.2)
BUN: 15 mg/dL (ref 6–20)
CO2: 26 mmol/L (ref 22–32)
CREATININE: 1.06 mg/dL (ref 0.61–1.24)
Calcium: 8.8 mg/dL — ABNORMAL LOW (ref 8.9–10.3)
Chloride: 101 mmol/L (ref 101–111)
GFR calc Af Amer: >60 mL/min (ref >60)
Glucose, Bld: 88 mg/dL (ref 65–99)
Potassium: 3.8 mmol/L (ref 3.5–5.1)
Sodium: 137 mmol/L (ref 135–145)
TOTAL PROTEIN: 6.2 g/dL — AB (ref 6.5–8.1)

## 2016-04-15 LAB — GLUCOSE, CAPILLARY
GLUCOSE-CAPILLARY: 179 mg/dL — AB (ref 65–99)
GLUCOSE-CAPILLARY: 170 mg/dL — AB (ref 65–99)
Glucose-Capillary: 107 mg/dL — ABNORMAL HIGH (ref 65–99)
Glucose-Capillary: 144 mg/dL — ABNORMAL HIGH (ref 65–99)

## 2016-04-15 LAB — LIPID PANEL
CHOLESTEROL: 129 mg/dL (ref 0–200)
HDL: 39 mg/dL — AB (ref >40)
LDL CALC: 71 mg/dL (ref 0–99)
TRIGLYCERIDES: 97 mg/dL (ref <150)
Total CHOL/HDL Ratio: 3.3 RATIO
VLDL: 19 mg/dL (ref 0–40)

## 2016-04-15 LAB — CBC
HEMATOCRIT: 36.3 % — AB (ref 39.0–52.0)
HEMOGLOBIN: 12.2 g/dL — AB (ref 13.0–17.0)
MCH: 29.8 pg (ref 26.0–34.0)
MCHC: 33.6 g/dL (ref 30.0–36.0)
MCV: 88.5 fL (ref 78.0–100.0)
PLATELETS: 123 10*3/uL — AB (ref 150–400)
RBC: 4.1 MIL/uL — AB (ref 4.22–5.81)
RDW: 12.4 % (ref 11.5–15.5)
WBC: 9.9 10*3/uL (ref 4.0–10.5)

## 2016-04-15 LAB — RPR: RPR: NONREACTIVE

## 2016-04-15 LAB — HIV ANTIBODY (ROUTINE TESTING W REFLEX): HIV SCREEN 4TH GENERATION: NONREACTIVE

## 2016-04-15 NOTE — Procedures (Signed)
ELECTROENCEPHALOGRAM REPORT  Date of Study: 04/15/2016  Patient's Name: Amedeo PlentyRoy D Brosseau MRN: [id number] Date of Birth: Sep 25, 1928  Referring Provider: Dr. Rhona Leavensimothy Oster  Clinical History: This is an 82 year old man with speech difficulties and confusion.   Medications: ampicillin (OMNIPEN) 2 g in sodium chloride 0.9 % 50 mL IVPB  aspirin EC tablet 81 mg  benazepril (LOTENSIN) tablet 20 mg  cefTRIAXone (ROCEPHIN) 2 g in dextrose 5 % 50 mL IVPB  clopidogrel (PLAVIX) tablet 75 mg  donepezil (ARICEPT) tablet 5 mg  insulin aspart (novoLOG) injection 0-9 Units  isosorbide dinitrate (ISORDIL) tablet 20 mg  metoprolol tartrate (LOPRESSOR) tablet 25 mg  nitroGLYCERIN (NITROSTAT) SL tablet 0.4 mg  rosuvastatin (CRESTOR) tablet 10 mg  vancomycin (VANCOCIN) 500 mg in sodium chloride 0.9 % 100 mL IVPB   Technical Summary: A multichannel digital EEG recording measured by the international 10-20 system with electrodes applied with paste and impedances below 5000 ohms performed as portable with EKG monitoring in an awake and asleep patient.  Hyperventilation and photic stimulation were not performed.  The digital EEG was referentially recorded, reformatted, and digitally filtered in a variety of bipolar and referential montages for optimal display.   Description: The patient is awake and asleep during the recording.  During maximal wakefulness, there is a symmetric, medium voltage 7.5 Hz posterior dominant rhythm that attenuates with eye opening. This is admixed with a small amount of diffuse 4-5 Hz theta slowing of the waking background. There is additional focal theta slowing seen over the left temporal region. During drowsiness and sleep, there is an increase in theta slowing of the background with poorly formed vertex waves and sleep spindles were seen.  Hyperventilation and photic stimulation were not performed.  There were no epileptiform discharges or electrographic seizures seen.    EKG lead  showed sinus bradycardia.  Impression: This awake and asleep EEG is abnormal due to the presence of: 1. Slowing of the posterior dominant rhythm 2. Mild diffuse slowing of the waking background 3. Occasional focal slowing over the left temporal region   Clinical Correlation of the above findings indicates diffuse cerebral dysfunction that is non-specific in etiology and can be seen with hypoxic/ischemic injury, toxic/metabolic encephalopathies, neurodegenerative disorders, or medication effect. Focal slowing over the left temporal regions indicates focal cerebral dysfunction in this region suggestive of underlying structural or physiologic abnormality. The absence of epileptiform discharges does not rule out a clinical diagnosis of epilepsy.  Clinical correlation is advised.   Patrcia DollyKaren Aquino, M.D.

## 2016-04-15 NOTE — Progress Notes (Signed)
Subjective: Feels back to his baseline at this time. He recalls not being able to get his words out and only able to make a "sshhhh" sound but this resolved. HE states he has had this occured a bout one year prior and at that time it lasted about 2-3 days. He says he came to hospital but there is no record of this.   Exam: Vitals:   04/15/16 1043 04/15/16 1356  BP: (!) 145/65 (!) 143/64  Pulse: 64 (!) 57  Resp: 16 20  Temp: 97.9 F (36.6 C) 97.4 F (36.3 C)       Gen: In bed, NAD MS: alert and oriented to hospital, year but thought it was September. Follows commands. Cognitive slowing and easily distracted.   CN: PERRLA, EOMI, TML, Face symmetric, vision intact Motor: moving all extremities well Sensory:intact   Pertinent Labs/Diagnostics: Ammonia 12 HIV (-) RPR (-) TSH normal Sed rate normal MRI non-revealing for etiology  EEG: Impression: This awake and asleep EEG is abnormal due to the presence of: 1. Slowing of the posterior dominant rhythm 2. Mild diffuse slowing of the waking background 3. Occasional focal slowing over the left temporal region   Clinical Correlation of the above findings indicates diffuse cerebral dysfunction that is non-specific in etiology and can be seen with hypoxic/ischemic injury, toxic/metabolic encephalopathies, neurodegenerative disorders, or medication effect.   Felicie MornDavid Smith PA-C [facility] [phone]  I have seen and evaluated the patient. I have reviewed the above note and agree with the above.   Impression: 82 year old male with dementia and transient confusion of unclear etiology. Possible mild delirium which has cleared. I have very low suspicion for meningitis given his rapid improvement. He reports a similar episode a few years ago that lasted for several days. He also remembers the event yesterday. I think the seizure is relatively unlikely especially with no clear seizure focus on EEG.   Recommendations: 1)  continue present management 2) no further neurodiagnostic workup at this time as I suspect he would all be relatively low yield. If he has further episodes of concern, then could revisit further workup at that time. 3) neurology to sign off at this time, please call with further questions or concerns.  Ritta SlotMcNeill Eusevio Schriver, MD [facility] [phone]  If 7pm- 7am, please page neurology on call as listed in AMION.  04/15/2016, 2:34 PM

## 2016-04-15 NOTE — Progress Notes (Addendum)
Patient ID: Jerry Benson, male   DOB: 08-Mar-1929, 82 y.o.   MRN: [id number]  PROGRESS NOTE    JASSEN SARVER  WUJ:[id number] DOB: September 20, 1928 DOA: 04/14/2016  PCP: Ronny Flurry, MD   Brief Narrative:  82 y.o. male with h past medical istory significant for CAD, HTN, DM, and dementia who presented to [facility] with confusion and difficulty speaking as noted by family. Patient also reported headaches but no associated nausea or vomiting or blurred vision or neck stiffness. CT head and angiogram did not show acute findings. Neurology has seen the patient in consultation.  Assessment & Plan:   Principal Problem:   Acute encephalopathy / Dementia without behavioral disturbance - Confusion likely secondary to patient's history of dementia. I'm not convinced that there is an acute infectious process going on such as meningitis. There is no neck stiffness, no nausea or vomiting. No fevers, no elevated white blood cell count - CT head and angiogram did not show acute findings - We'll stop antibiotics - EEG showed diffuse cerebral dysfunction, nonspecific in etiology but can be seen with hypoxic or ischemic injury - MRI brain is pending - Continue Aricept   Active Problems:   Type 2 diabetes mellitus with vascular disease (HCC) - Patient is taking metformin at home but is on sliding scale insulin in hospital     Hx of CABG / CAD (coronary artery disease), native artery without angina pectoris - Continue aspirin and Plavix    Dyslipidemia associated with type 2 diabetes mellitus - Continue statin therapy    CKD (chronic kidney disease) stage 3, GFR 30-59 ml/min - Cr WNL    Essential hypertension - Continue Lotensin, isordil, metoprolol     DVT prophylaxis: SCD's bilaterally  Code Status: full code  Family Communication: No family at the bedside Disposition Plan: home or SNF depending on PT evaluation    Consultants:   Neurology   PT  Procedures:   EEG    Antimicrobials:   Rocephin, vanco, Unasyn, zovirax 04/14/2016 --> 04/15/2016   Subjective: No overnight events.   Objective: Vitals:   04/15/16 0315 04/15/16 0515 04/15/16 0715 04/15/16 1043  BP: 117/61 (!) 93/46 (!) 97/52 (!) 145/65  Pulse: 65 (!) 58 64 64  Resp: 18 18 16 16   Temp: 97.8 F (36.6 C) 98.9 F (37.2 C) 98.1 F (36.7 C) 97.9 F (36.6 C)  TempSrc: Oral Oral Oral Oral  SpO2: 93% 96% 97% 96%  Weight:      Height:        Intake/Output Summary (Last 24 hours) at 04/15/16 1132 Last data filed at 04/15/16 0524  Gross per 24 hour  Intake           1002.4 ml  Output                0 ml  Net           1002.4 ml   Filed Weights   04/14/16 2115  Weight: 59.2 kg (130 lb 8.2 oz)    Examination:  General exam: Appears calm and comfortable  Respiratory system: Clear to auscultation. Respiratory effort normal. Cardiovascular system: S1 & S2 heard, RRR. No pedal edema. Gastrointestinal system: Abdomen is nondistended, soft and nontender. No organomegaly or masses felt. Normal bowel sounds heard. Central nervous system: Alert and oriented. No focal neurological deficits. Extremities: Symmetric 5 x 5 power. Skin: No rashes, lesions or ulcers Psychiatry: Mood & affect appropriate.   Data Reviewed: I have personally reviewed  following labs and imaging studies  CBC:  Recent Labs Lab 04/14/16 1643 04/14/16 1706 04/15/16 0444  WBC 9.5  --  9.9  NEUTROABS 7.9*  --   --   HGB 13.5 14.3 12.2*  HCT 40.9 42.0 36.3*  MCV 89.3  --  88.5  PLT 124*  --  123*   Basic Metabolic Panel:  Recent Labs Lab 04/14/16 1643 04/14/16 1706 04/15/16 0444  NA 139 138 137  K 3.9 4.6 3.8  CL 104 102 101  CO2 26  --  26  GLUCOSE 91 86 88  BUN 13 22* 15  CREATININE 1.01 0.90 1.06  CALCIUM 9.9  --  8.8*   GFR: Estimated Creatinine Clearance: 42.7 mL/min (by C-G formula based on SCr of 1.06 mg/dL). Liver Function Tests:  Recent Labs Lab 04/14/16 1643 04/15/16 0444   AST 25 24  ALT 14* 13*  ALKPHOS 58 50  BILITOT 1.4* 1.1  PROT 6.8 6.2*  ALBUMIN 4.5 3.7   No results for input(s): LIPASE, AMYLASE in the last 168 hours.  Recent Labs Lab 04/14/16 2149  AMMONIA 12   Coagulation Profile:  Recent Labs Lab 04/14/16 1643  INR 1.01   Cardiac Enzymes: No results for input(s): CKTOTAL, CKMB, CKMBINDEX, TROPONINI in the last 168 hours. BNP (last 3 results) No results for input(s): PROBNP in the last 8760 hours. HbA1C: No results for input(s): HGBA1C in the last 72 hours. CBG:  Recent Labs Lab 04/14/16 1559 04/14/16 2318 04/15/16 0633  GLUCAP 104* 82 107*   Lipid Profile:  Recent Labs  04/15/16 0444  CHOL 129  HDL 39*  LDLCALC 71  TRIG 97  CHOLHDL 3.3   Thyroid Function Tests:  Recent Labs  04/14/16 2143  TSH 0.699  FREET4 0.81   Anemia Panel:  Recent Labs  04/14/16 2143  VITAMINB12 294   Urine analysis:    Component Value Date/Time   COLORURINE YELLOW 04/14/2016 1622   APPEARANCEUR CLEAR 04/14/2016 1622   LABSPEC 1.010 04/14/2016 1622   PHURINE 7.5 04/14/2016 1622   GLUCOSEU NEGATIVE 04/14/2016 1622   HGBUR NEGATIVE 04/14/2016 1622   BILIRUBINUR NEGATIVE 04/14/2016 1622   KETONESUR NEGATIVE 04/14/2016 1622   PROTEINUR 30 (A) 04/14/2016 1622   NITRITE NEGATIVE 04/14/2016 1622   LEUKOCYTESUR NEGATIVE 04/14/2016 1622   Sepsis Labs: @LABRCNTIP (procalcitonin:4,lacticidven:4)   )No results found for this or any previous visit (from the past 240 hour(s)).    Radiology Studies: Ct Angio Head W Or Wo Contrast Result Date: 04/14/2016  1. No acute intracranial abnormality. 2. No intracranial occlusion or high-grade stenosis. 3. Bilateral proximal internal carotid artery narrowing, measuring 50-55%. 4. Severe stenosis of the proximal right subclavian artery. 5. Moderate narrowing of the left vertebral artery origin. 6. Aortic atherosclerosis.   Ct Angio Neck W And/or Wo Contrast Result Date: 04/14/2016  1. No  acute intracranial abnormality. 2. No intracranial occlusion or high-grade stenosis. 3. Bilateral proximal internal carotid artery narrowing, measuring 50-55%. 4. Severe stenosis of the proximal right subclavian artery. 5. Moderate narrowing of the left vertebral artery origin. 6. Aortic atherosclerosis.   Mr Brain Wo Contrast Result Date: 04/15/2016  1. No acute intracranial abnormality is identified. 2. Moderate chronic microvascular ischemic changes and parenchymal volume loss.    Scheduled Meds: . acyclovir  620 mg Intravenous Q8H  . ampicillin (OMNIPEN) IV  2 g Intravenous Q4H  . aspirin EC  81 mg Oral Daily  . benazepril  20 mg Oral Daily  . cefTRIAXone (ROCEPHIN)  IV  2 g Intravenous Q12H  . clopidogrel  75 mg Oral Daily  . donepezil  5 mg Oral QHS  . insulin aspart  0-9 Units Subcutaneous TID WC  . isosorbide dinitrate  20 mg Oral TID  . metoprolol  25 mg Oral BID  . rosuvastatin  10 mg Oral QHS  . vancomycin  500 mg Intravenous Q12H   Continuous Infusions: . sodium chloride 75 mL/hr at 04/14/16 2252     LOS: 0 days    Time spent: 25 minutes  Greater than 50% of the time spent on counseling and coordinating the care.   Manson Passey, MD [facility] Pager [phone]  If 7PM-7AM, please contact night-coverage [email or web address] Password [email or web address] 04/15/2016, 11:32 AM

## 2016-04-15 NOTE — Progress Notes (Signed)
EEG completed; results pending.    

## 2016-04-15 NOTE — Care Management Note (Signed)
Case Management Note  Patient Details  Name: Jerry Benson MRN: [id number] Date of Birth: 01/23/1929  Subjective/Objective:     Pt in with acute encephalopathy. He is from home with his spouse.               Action/Plan: Awaiting medical workup. CM following for discharge needs.   Expected Discharge Date:                  Expected Discharge Plan:     In-House Referral:     Discharge planning Services     Post Acute Care Choice:    Choice offered to:     DME Arranged:    DME Agency:     HH Arranged:    HH Agency:     Status of Service:  In process, will continue to follow  If discussed at Long Length of Stay Meetings, dates discussed:    Additional Comments:  Kermit BaloKelli F Fadi Menter, RN 04/15/2016, 11:40 AM

## 2016-04-16 ENCOUNTER — Telehealth: Payer: Self-pay | Admitting: Interventional Cardiology

## 2016-04-16 DIAGNOSIS — I10 Essential (primary) hypertension: Secondary | ICD-10-CM | POA: Diagnosis not present

## 2016-04-16 DIAGNOSIS — N183 Chronic kidney disease, stage 3 (moderate): Secondary | ICD-10-CM | POA: Diagnosis not present

## 2016-04-16 DIAGNOSIS — G9340 Encephalopathy, unspecified: Secondary | ICD-10-CM | POA: Diagnosis not present

## 2016-04-16 LAB — HEMOGLOBIN A1C
HEMOGLOBIN A1C: 6.4 % — AB (ref 4.8–5.6)
Mean Plasma Glucose: 137 mg/dL

## 2016-04-16 MED ORDER — DONEPEZIL HCL 5 MG PO TABS: 5.0000 mg | ORAL_TABLET | Freq: Every day | ORAL | 0 refills | Status: AC

## 2016-04-16 NOTE — Care Management Obs Status (Signed)
MEDICARE OBSERVATION STATUS NOTIFICATION   Patient Details  Name: Jerry Benson MRN: [id number] Date of Birth: 03/28/1929   Medicare Observation Status Notification Given:  Yes    Kermit BaloKelli F Jerek Meulemans, RN 04/16/2016, 11:19 AM

## 2016-04-16 NOTE — Progress Notes (Signed)
Refused am vitals, refused Am blood sugar both with multiple attempts. Will continue to monitor.

## 2016-04-16 NOTE — Discharge Instructions (Signed)
Aspirin, ASA chewable tablets °What is this medicine? °ASPIRIN (AS pir in) is a pain reliever. It is used to treat mild pain and fever. This medicine is also used as directed by a doctor to prevent and to treat heart attacks, to prevent strokes, and to treat arthritis or inflammation. °This medicine may be used for other purposes; ask your health care provider or pharmacist if you have questions. °What should I tell my health care provider before I take this medicine? °They need to know if you have any of these conditions: °-anemia °-asthma °-bleeding problems °-child with chickenpox, the flu, or other viral infection °-diabetes °-gout °-if you frequently drink alcohol containing drinks °-kidney disease °-liver disease °-low level of vitamin K °-lupus °-smoke tobacco °-stomach ulcers or other problems °-an unusual or allergic reaction to aspirin, tartrazine dye, other medicines, dyes, or preservatives °-pregnant or trying to get pregnant °-breast-feeding °How should I use this medicine? °Take this medicine by mouth. Chew it completely before swallowing. Follow the directions on the package or prescription label. Do not take your medicine more often than directed. °Talk to your pediatrician regarding the use of this medicine in children. While this drug may be prescribed for children as young as 12 years of age for selected conditions, precautions do apply. Children and teenagers should not use this medicine to treat chicken pox or flu symptoms unless directed by a doctor. °Patients over 65 years old may have a stronger reaction and need a smaller dose. °Overdosage: If you think you have taken too much of this medicine contact a poison control center or emergency room at once. °NOTE: This medicine is only for you. Do not share this medicine with others. °What if I miss a dose? °If you are taking this medicine on a regular schedule and miss a dose, take it as soon as you can. If it is almost time for your next dose,  take only that dose. Do not take double or extra doses. °What may interact with this medicine? °Do not take this medicine with any of the following medications: °-cidofovir °-ketorolac °-probenecid °This medicine may also interact with the following medications: °-alcohol °-alendronate °-bismuth subsalicylate °-flavocoxid °-herbal supplements like feverfew, garlic, ginger, ginkgo biloba, horse chestnut °-medicines for diabetes or glaucoma like acetazolamide, methazolamide °-medicines for gout °-medicines that treat or prevent blood clots like enoxaparin, heparin, ticlopidine, warfarin °-other aspirin and aspirin-like medicines °-NSAIDs, medicines for pain and inflammation, like ibuprofen or naproxen °-pemetrexed °-sulfinpyrazone °-varicella live vaccine °This list may not describe all possible interactions. Give your health care provider a list of all the medicines, herbs, non-prescription drugs, or dietary supplements you use. Also tell them if you smoke, drink alcohol, or use illegal drugs. Some items may interact with your medicine. °What should I watch for while using this medicine? °If you are treating yourself for pain, tell your doctor or health care professional if the pain lasts more than 10 days, if it gets worse, or if there is a new or different kind of pain. Tell your doctor if you see redness or swelling. Also, check with your doctor if you have a fever that lasts for more than 3 days. Only take this medicine to prevent heart attacks or blood clotting if prescribed by your doctor or health care professional. °Do not take aspirin or aspirin-like medicines with this medicine. Too much aspirin can be dangerous. Always read the labels carefully. °This medicine can irritate your stomach or cause bleeding problems. Do not smoke cigarettes   or drink alcohol while taking this medicine. Do not lie down for 30 minutes after taking this medicine to prevent irritation to your throat. °If you are scheduled for any  medical or dental procedure, tell your healthcare provider that you are taking this medicine. You may need to stop taking this medicine before the procedure. °What side effects may I notice from receiving this medicine? °Side effects that you should report to your doctor or health care professional as soon as possible: °-allergic reactions like skin rash, itching or hives, swelling of the face, lips, or tongue °-breathing problems °-changes in hearing, ringing in the ears °-confusion °-general ill feeling or flu-like symptoms °-pain on swallowing °-redness, blistering, peeling or loosening of the skin, including inside the mouth or nose °-signs and symptoms of bleeding such as bloody or black, tarry stools; red or dark-brown urine; spitting up blood or brown material that looks like coffee grounds; red spots on the skin; unusual bruising or bleeding from the eye, gums, or nose °-trouble passing urine or change in the amount of urine °-unusually weak or tired °-yellowing of the eyes or skin °Side effects that usually do not require medical attention (report to your doctor or health care professional if they continue or are bothersome): °-diarrhea or constipation °-nausea, vomiting °-stomach gas, heartburn °This list may not describe all possible side effects. Call your doctor for medical advice about side effects. You may report side effects to FDA at 1-800-FDA-1088. °Where should I keep my medicine? °Keep out of the reach of children. °Store at room temperature between 15 and 30 degrees C (59 and 86 degrees F). Protect from heat and moisture. Do not use this medicine if it has a strong vinegar smell. Throw away any unused medicine after the expiration date. °NOTE: This sheet is a summary. It may not cover all possible information. If you have questions about this medicine, talk to your doctor, pharmacist, or health care provider. °  °© 2016, [employer]. (2012-10-12 15:34:27) ° °

## 2016-04-16 NOTE — Care Management Note (Signed)
Case Management Note  Patient Details  Name: Jerry Benson MRN: [id number] Date of Birth: 09/14/1928  Subjective/Objective:                    Action/Plan: [facility] home [facility] CM spoke with patients wife over the phone and [facility] [place]. She stated she has used [facility] in the past and would like to use them again. [facility]Karen [facility] notified and accepted the referral. Pt states his Grandson checks on them several times a week and will be providing transportation home. Bedside RN updated.  Expected Discharge Date:                  Expected Discharge Plan:  Home w Home Health Services  In-House Referral:     Discharge planning Services  CM Consult  Post Acute Care Choice:  Home Health Choice offered to:  Spouse  DME Arranged:    DME Agency:     HH Arranged:  RN, PT, OT, Nurse's Aide HH Agency:  [facility]  Status of Service:  Completed, signed off  If discussed at Long Length of Stay Meetings, dates discussed:    Additional Comments:  Kermit BaloKelli F Euna Armon, RN 04/16/2016, 11:43 AM

## 2016-04-16 NOTE — Progress Notes (Signed)
Patient frequently out of bed during the night slightly more confused than when fully alert.  Patient stating "Where am I? I am going home now!" Patient easily redirected most times by staff.  Patient in recliner to nursing station prior to sleep for better supervision. Continue to monitor.

## 2016-04-16 NOTE — Progress Notes (Signed)
Pt determined to go home today. MD notified and agrees that patient can probably go home. Family notified this is possible. Left message for grandson to provide transportation. Lawson RadarHeather M Braydin Aloi

## 2016-04-16 NOTE — Telephone Encounter (Signed)
**Note De-Identified  Obfuscation** The pts grandson, Casimiro NeedleMichael, is advised that I will discuss the pts need to f/u with cardiology with Dr Eldridge DaceVaranasi and that I will call him back @ [phone] to let him know if f/u is needed. He verbalized understanding and thanked me for calling him back.  Please advise on whether or not the pt should f/u with cardiology as he was seen in the hospital for encephalopathy.

## 2016-04-16 NOTE — Discharge Summary (Signed)
Physician Discharge Summary  Jerry Benson ZOX:[id number] DOB: September 16, 1928 DOA: 04/14/2016  PCP: Ronny Flurry, MD  Admit date: 04/14/2016 Discharge date: 04/16/2016  Recommendations for Outpatient Follow-up:  1. Check CBC and BMP in PCP office 2. No changes in medications on discharge   Discharge Diagnoses:  Principal Problem:   Acute encephalopathy Active Problems:   Type 2 diabetes mellitus with vascular disease (HCC)   Hx of CABG   CAD (coronary artery disease)   CKD (chronic kidney disease) stage 3, GFR 30-59 ml/min   Expressive aphasia   Hypertension    Discharge Condition: stable   Diet recommendation: as tolerated   History of present illness:  82 y.o.malewith h past medical istory significant for CAD, HTN, DM, and dementia who presented to [facility] with confusion and difficulty speaking as noted by family. Patient also reported headaches but no associated nausea or vomiting or blurred vision or neck stiffness. CT head and angiogram did not show acute findings. Neurology has seen the patient in consultation.  Hospital Course:    Assessment & Plan:   Principal Problem:   Acute encephalopathy / Dementia without behavioral disturbance - Confusion likely secondary to patient's history of dementia. I'm not convinced that there is an acute infectious process going on such as meningitis. There is no neck stiffness, no nausea or vomiting. No fevers, no elevated white blood cell count - CT head and angiogram did not show acute findings - EEG showed diffuse cerebral dysfunction, nonspecific in etiology but can be seen with hypoxic or ischemic injury - MRI brain showed no acute intracranial abnormalities  - Continue Aricept   Active Problems:   Type 2 diabetes mellitus with vascular disease (HCC) - Continue home med    Hx of CABG / CAD (coronary artery disease), native artery without angina pectoris - Continue aspirin and Plavix    Dyslipidemia associated  with type 2 diabetes mellitus - Continue statin therapy    CKD (chronic kidney disease) stage 3, GFR 30-59 ml/min - Cr WNL    Essential hypertension - Continue Lotensin, isordil, metoprolol     DVT prophylaxis: SCD's bilaterally  Code Status: full code  Family Communication: No family at the bedside    Consultants:   Neurology   PT  Procedures:   EEG   Antimicrobials:   Rocephin, vanco, Unasyn, zovirax 04/14/2016 --> 04/15/2016    Signed:  Manson Passey, MD  [facility] 04/16/2016, 11:12 AM  Pager #: [phone]  Time spent in minutes: less than 30 minutes    Discharge Exam: Vitals:   04/16/16 0145 04/16/16 0947  BP: (!) 193/74 (!) 180/76  Pulse: 73 60  Resp: 18 18  Temp: 97.5 F (36.4 C) 98.4 F (36.9 C)   Vitals:   04/15/16 1700 04/15/16 2237 04/16/16 0145 04/16/16 0947  BP: (!) 162/63 (!) 174/78 (!) 193/74 (!) 180/76  Pulse: (!) 57 (!) 59 73 60  Resp: 16 16 18 18   Temp:   97.5 F (36.4 C) 98.4 F (36.9 C)  TempSrc: Oral Oral Oral Oral  SpO2: 99% 95% 99% 98%  Weight:      Height:        General: Pt is alert, not in acute distress Cardiovascular: Regular rate and rhythm, S1/S2 + Respiratory: Clear to auscultation bilaterally, no wheezing, no crackles, no rhonchi Abdominal: Soft, non tender, non distended, bowel sounds +, no guarding Extremities: no cyanosis, pulses palpable bilaterally DP and PT Neuro: Grossly nonfocal  Discharge Instructions  Discharge Instructions  Call MD for:  difficulty breathing, headache or visual disturbances    Complete by:  As directed    Call MD for:  persistant nausea and vomiting    Complete by:  As directed    Call MD for:  redness, tenderness, or signs of infection (pain, swelling, redness, odor or green/yellow discharge around incision site)    Complete by:  As directed    Call MD for:  severe uncontrolled pain    Complete by:  As directed    Diet - low sodium heart healthy     Complete by:  As directed    Increase activity slowly    Complete by:  As directed        Medication List    TAKE these medications   aspirin 81 MG tablet Take 81 mg by mouth daily.   benazepril 20 MG tablet Commonly known as:  LOTENSIN Take 1 tablet (20 mg total) by mouth daily.   clopidogrel 75 MG tablet Commonly known as:  PLAVIX TAKE 1 TABLET BY MOUTH DAILY   donepezil 5 MG tablet Commonly known as:  ARICEPT Take 1 tablet (5 mg total) by mouth at bedtime.   famotidine 40 MG tablet Commonly known as:  PEPCID Take 1 tablet (40 mg total) by mouth daily.   glipiZIDE 10 MG tablet Commonly known as:  GLUCOTROL Take 1 tablet (10 mg total) by mouth daily before breakfast.   isosorbide dinitrate 20 MG tablet Commonly known as:  ISORDIL TAKE 1 TABLET BY MOUTH THREE TIMES DAILY   metFORMIN 500 MG tablet Commonly known as:  GLUCOPHAGE TAKE 1 TABLET BY MOUTH THREE TIMES DAILY WITH MEALS   metoprolol 50 MG tablet Commonly known as:  LOPRESSOR TAKE 1/2 TABLET BY MOUTH TWICE DAILY   MULTIVITAMIN/IRON PO Take 1 tablet by mouth daily.   nitroGLYCERIN 0.4 MG SL tablet Commonly known as:  NITROSTAT PLACE 1 TABLET ONE TABLET UNDER TONGUE AS NEEDED FOR CHEST PAIN   rosuvastatin 10 MG tablet Commonly known as:  CRESTOR TAKE 1/2 TABLET BY MOUTH EVERY DAY What changed:  See the new instructions.      Follow-up Information    Ronny Flurry, MD. Schedule an appointment as soon as possible for a visit in 1 week(s).   Specialty:  Cardiology           The results of significant diagnostics from this hospitalization (including imaging, microbiology, ancillary and laboratory) are listed below for reference.    Significant Diagnostic Studies: Ct Angio Head W Or Wo Contrast  Result Date: 04/14/2016 CLINICAL DATA:  Pain and aphasia EXAM: CT ANGIOGRAPHY HEAD AND NECK TECHNIQUE: Multidetector CT imaging of the head and neck was performed using the standard protocol during  bolus administration of intravenous contrast. Multiplanar CT image reconstructions and MIPs were obtained to evaluate the vascular anatomy. Carotid stenosis measurements (when applicable) are obtained utilizing NASCET criteria, using the distal internal carotid diameter as the denominator. CONTRAST:  100 mL Isovue 370 IV COMPARISON:  None. FINDINGS: CT HEAD FINDINGS Brain: No mass lesion, intraparenchymal hemorrhage or extra-axial collection. No evidence of acute cortical infarct. There is periventricular hypoattenuation compatible with chronic microvascular disease. Vascular: Left vertebral artery calcification. No hyperdense vessel sign. Skull: Normal visualized skull base, calvarium and extracranial soft tissues. Sinuses/Orbits: Status post left mastoidectomy. Paranasal sinuses are clear. Normal orbits. Review of the MIP images confirms the above findings CTA NECK FINDINGS Aortic arch: There is a bovine variant aortic arch branching pattern with the brachiocephalic  and left common carotid artery is sharing a common origin. There is atherosclerotic calcification within the aortic arch. There is severe stenosis at the origin of the right subclavian artery. Left subclavian artery is unremarkable. Right carotid system: There is atherosclerotic calcification at the bifurcation with stenosis measuring approximately 50%. The internal carotid artery follows a medial course at the level of the hypopharynx. Left carotid system: There is atherosclerotic calcification of the left carotid bifurcation with proximal internal carotid artery stenosis measuring 55%. The left internal carotid artery also follows a medial course of the level of the hypopharynx. Vertebral arteries: There is moderate narrowing of the left vertebral artery origin. The right vertebral artery origin is normal. The vertebral artery systems is left dominant. Both vertebral arteries are normal to their confluence with the basilar artery. Skeleton: No acute  abnormality. Other neck: Unremarkable Upper chest: No pulmonary nodule or pleural effusion. Review of the MIP images confirms the above findings CTA HEAD FINDINGS Anterior circulation: --Intracranial internal carotid arteries: Normal. --Anterior cerebral arteries: Normal. --Middle cerebral arteries: Normal. --Posterior communicating arteries: Small bilaterally. Posterior circulation: --Posterior cerebral arteries: Normal. --Superior cerebellar arteries: Normal. --Basilar artery: Normal. --Anterior inferior cerebellar arteries: Normal. --Posterior inferior cerebellar arteries: Normal. Venous sinuses: As permitted by contrast timing, patent. Anatomic variants: None Delayed phase: No parenchymal contrast enhancement Review of the MIP images confirms the above findings IMPRESSION: 1. No acute intracranial abnormality. 2. No intracranial occlusion or high-grade stenosis. 3. Bilateral proximal internal carotid artery narrowing, measuring 50-55%. 4. Severe stenosis of the proximal right subclavian artery. 5. Moderate narrowing of the left vertebral artery origin. 6. Aortic atherosclerosis. Electronically Signed   By: Deatra Robinson M.D.   On: 04/14/2016 18:47   Ct Angio Neck W And/or Wo Contrast  Result Date: 04/14/2016 CLINICAL DATA:  Pain and aphasia EXAM: CT ANGIOGRAPHY HEAD AND NECK TECHNIQUE: Multidetector CT imaging of the head and neck was performed using the standard protocol during bolus administration of intravenous contrast. Multiplanar CT image reconstructions and MIPs were obtained to evaluate the vascular anatomy. Carotid stenosis measurements (when applicable) are obtained utilizing NASCET criteria, using the distal internal carotid diameter as the denominator. CONTRAST:  100 mL Isovue 370 IV COMPARISON:  None. FINDINGS: CT HEAD FINDINGS Brain: No mass lesion, intraparenchymal hemorrhage or extra-axial collection. No evidence of acute cortical infarct. There is periventricular hypoattenuation compatible  with chronic microvascular disease. Vascular: Left vertebral artery calcification. No hyperdense vessel sign. Skull: Normal visualized skull base, calvarium and extracranial soft tissues. Sinuses/Orbits: Status post left mastoidectomy. Paranasal sinuses are clear. Normal orbits. Review of the MIP images confirms the above findings CTA NECK FINDINGS Aortic arch: There is a bovine variant aortic arch branching pattern with the brachiocephalic and left common carotid artery is sharing a common origin. There is atherosclerotic calcification within the aortic arch. There is severe stenosis at the origin of the right subclavian artery. Left subclavian artery is unremarkable. Right carotid system: There is atherosclerotic calcification at the bifurcation with stenosis measuring approximately 50%. The internal carotid artery follows a medial course at the level of the hypopharynx. Left carotid system: There is atherosclerotic calcification of the left carotid bifurcation with proximal internal carotid artery stenosis measuring 55%. The left internal carotid artery also follows a medial course of the level of the hypopharynx. Vertebral arteries: There is moderate narrowing of the left vertebral artery origin. The right vertebral artery origin is normal. The vertebral artery systems is left dominant. Both vertebral arteries are normal to their confluence with  the basilar artery. Skeleton: No acute abnormality. Other neck: Unremarkable Upper chest: No pulmonary nodule or pleural effusion. Review of the MIP images confirms the above findings CTA HEAD FINDINGS Anterior circulation: --Intracranial internal carotid arteries: Normal. --Anterior cerebral arteries: Normal. --Middle cerebral arteries: Normal. --Posterior communicating arteries: Small bilaterally. Posterior circulation: --Posterior cerebral arteries: Normal. --Superior cerebellar arteries: Normal. --Basilar artery: Normal. --Anterior inferior cerebellar arteries:  Normal. --Posterior inferior cerebellar arteries: Normal. Venous sinuses: As permitted by contrast timing, patent. Anatomic variants: None Delayed phase: No parenchymal contrast enhancement Review of the MIP images confirms the above findings IMPRESSION: 1. No acute intracranial abnormality. 2. No intracranial occlusion or high-grade stenosis. 3. Bilateral proximal internal carotid artery narrowing, measuring 50-55%. 4. Severe stenosis of the proximal right subclavian artery. 5. Moderate narrowing of the left vertebral artery origin. 6. Aortic atherosclerosis. Electronically Signed   By: Deatra RobinsonKevin  Herman M.D.   On: 04/14/2016 18:47   Mr Brain Wo Contrast  Result Date: 04/15/2016 CLINICAL DATA:  82 y/o  M; acute encephalopathy. EXAM: MRI HEAD WITHOUT CONTRAST TECHNIQUE: Multiplanar, multiecho pulse sequences of the brain and surrounding structures were obtained without intravenous contrast. COMPARISON:  04/14/2016 CT of the head and CT angiogram of head and neck. FINDINGS: Brain: No diffusion restriction to suggest acute/ early subacute infarct. No hydrocephalus. No extra-axial collection. No effacement of basilar cisterns. Foci of T2 FLAIR hyperintensity in subcortical and periventricular white matter are nonspecific but consistent with moderate chronic microvascular ischemic changes. Moderate parenchymal volume loss. There are a few scattered punctate foci of susceptibility hypointensity within left cerebellar hemisphere and in subcortical white matter of left frontal and parietal lobes compatible with hemosiderin deposition from old microhemorrhage. Vascular: Normal flow voids. Skull and upper cervical spine: Normal marrow signal. Sinuses/Orbits: Mild diffuse paranasal sinus mucosal thickening. Postsurgical changes related to left mastoidectomy with fat and the mastoidectomy bowl. Bilateral intra-ocular lens replacement. No abnormal signal of right mastoid air cells. Other: None. IMPRESSION: 1. No acute  intracranial abnormality is identified. 2. Moderate chronic microvascular ischemic changes and parenchymal volume loss. Electronically Signed   By: Mitzi HansenLance  Furusawa-Stratton M.D.   On: 04/15/2016 01:02    Microbiology: Recent Results (from the past 240 hour(s))  Blood culture (routine x 2)     Status: None (Preliminary result)   Collection Time: 04/14/16  8:25 PM  Result Value Ref Range Status   Specimen Description BLOOD LEFT ANTECUBITAL  Final   Special Requests BOTTLES DRAWN AEROBIC AND ANAEROBIC 5CC  Final   Culture NO GROWTH < 24 HOURS  Final   Report Status PENDING  Incomplete  Blood culture (routine x 2)     Status: None (Preliminary result)   Collection Time: 04/14/16  8:45 PM  Result Value Ref Range Status   Specimen Description BLOOD RIGHT ANTECUBITAL  Final   Special Requests BOTTLES DRAWN AEROBIC AND ANAEROBIC 5CC  Final   Culture NO GROWTH < 24 HOURS  Final   Report Status PENDING  Incomplete     Labs: Basic Metabolic Panel:  Recent Labs Lab 04/14/16 1643 04/14/16 1706 04/15/16 0444  NA 139 138 137  K 3.9 4.6 3.8  CL 104 102 101  CO2 26  --  26  GLUCOSE 91 86 88  BUN 13 22* 15  CREATININE 1.01 0.90 1.06  CALCIUM 9.9  --  8.8*   Liver Function Tests:  Recent Labs Lab 04/14/16 1643 04/15/16 0444  AST 25 24  ALT 14* 13*  ALKPHOS 58 50  BILITOT 1.4*  1.1  PROT 6.8 6.2*  ALBUMIN 4.5 3.7   No results for input(s): LIPASE, AMYLASE in the last 168 hours.  Recent Labs Lab 04/14/16 2149  AMMONIA 12   CBC:  Recent Labs Lab 04/14/16 1643 04/14/16 1706 04/15/16 0444  WBC 9.5  --  9.9  NEUTROABS 7.9*  --   --   HGB 13.5 14.3 12.2*  HCT 40.9 42.0 36.3*  MCV 89.3  --  88.5  PLT 124*  --  123*   Cardiac Enzymes: No results for input(s): CKTOTAL, CKMB, CKMBINDEX, TROPONINI in the last 168 hours. BNP: BNP (last 3 results) No results for input(s): BNP in the last 8760 hours.  ProBNP (last 3 results) No results for input(s): PROBNP in the last 8760  hours.  CBG:  Recent Labs Lab 04/14/16 2318 04/15/16 0633 04/15/16 1111 04/15/16 1612 04/15/16 2236  GLUCAP 82 107* 179* 144* 170*

## 2016-04-16 NOTE — Progress Notes (Signed)
Pt discharged home with grandson. Case management to contact patient's wife to discuss choice of home health agency.  IV and telemetry discontinued. Sent home discharge information with grandson.  Patient questions asked and answered. Will exit unit via wheelchair. Lawson RadarHeather M Benson Bakos

## 2016-04-16 NOTE — Telephone Encounter (Signed)
Nw Message  Pt grandson call requesting to speak with RN. Pt grandson was concerned with the pts discharge instructions. He states the pt dx is Acute encephalopathy and would like to know why pt would need to be seen by cardiology. Please call back to discuss

## 2016-04-17 NOTE — Telephone Encounter (Signed)
**Note De-Identified  Obfuscation** I left a message on Jerry Benson's VM and spoke with the pts wife letting them know that the pt does not need a cardiology f/u post his hospital stay per Dr Eldridge DaceVaranasi. Mrs. Jerry Benson verbalized understanding and stated that the pt has a f/u scheduled with Dr Jerry Benson, his pcp, on 10/25.  Appt with Jerry CarsonMichelle Lenze, PA-c on 10/17 has been canceled.

## 2016-04-17 NOTE — Telephone Encounter (Signed)
**Note De-identified  Obfuscation** LMTCB

## 2016-04-17 NOTE — Telephone Encounter (Signed)
He does not need cardiology f/u.  F/u with PMD is sufficient.

## 2016-04-19 LAB — CULTURE, BLOOD (ROUTINE X 2)
CULTURE: NO GROWTH
Culture: NO GROWTH

## 2016-04-20 DIAGNOSIS — F801 Expressive language disorder: Secondary | ICD-10-CM | POA: Diagnosis not present

## 2016-04-20 DIAGNOSIS — N183 Chronic kidney disease, stage 3 (moderate): Secondary | ICD-10-CM | POA: Diagnosis not present

## 2016-04-20 DIAGNOSIS — I2510 Atherosclerotic heart disease of native coronary artery without angina pectoris: Secondary | ICD-10-CM | POA: Diagnosis not present

## 2016-04-20 DIAGNOSIS — Z951 Presence of aortocoronary bypass graft: Secondary | ICD-10-CM | POA: Diagnosis not present

## 2016-04-20 DIAGNOSIS — Z7984 Long term (current) use of oral hypoglycemic drugs: Secondary | ICD-10-CM | POA: Diagnosis not present

## 2016-04-20 DIAGNOSIS — E1122 Type 2 diabetes mellitus with diabetic chronic kidney disease: Secondary | ICD-10-CM | POA: Diagnosis not present

## 2016-04-20 DIAGNOSIS — Z7901 Long term (current) use of anticoagulants: Secondary | ICD-10-CM | POA: Diagnosis not present

## 2016-04-20 DIAGNOSIS — I129 Hypertensive chronic kidney disease with stage 1 through stage 4 chronic kidney disease, or unspecified chronic kidney disease: Secondary | ICD-10-CM | POA: Diagnosis not present

## 2016-04-21 ENCOUNTER — Ambulatory Visit: Payer: Self-pay | Admitting: Physician Assistant

## 2016-04-22 DIAGNOSIS — E1122 Type 2 diabetes mellitus with diabetic chronic kidney disease: Secondary | ICD-10-CM | POA: Diagnosis not present

## 2016-04-22 DIAGNOSIS — Z7984 Long term (current) use of oral hypoglycemic drugs: Secondary | ICD-10-CM | POA: Diagnosis not present

## 2016-04-22 DIAGNOSIS — N183 Chronic kidney disease, stage 3 (moderate): Secondary | ICD-10-CM | POA: Diagnosis not present

## 2016-04-22 DIAGNOSIS — I129 Hypertensive chronic kidney disease with stage 1 through stage 4 chronic kidney disease, or unspecified chronic kidney disease: Secondary | ICD-10-CM | POA: Diagnosis not present

## 2016-04-22 DIAGNOSIS — Z951 Presence of aortocoronary bypass graft: Secondary | ICD-10-CM | POA: Diagnosis not present

## 2016-04-22 DIAGNOSIS — I2510 Atherosclerotic heart disease of native coronary artery without angina pectoris: Secondary | ICD-10-CM | POA: Diagnosis not present

## 2016-04-22 DIAGNOSIS — F801 Expressive language disorder: Secondary | ICD-10-CM | POA: Diagnosis not present

## 2016-04-22 DIAGNOSIS — Z7901 Long term (current) use of anticoagulants: Secondary | ICD-10-CM | POA: Diagnosis not present

## 2016-04-23 DIAGNOSIS — Z7984 Long term (current) use of oral hypoglycemic drugs: Secondary | ICD-10-CM | POA: Diagnosis not present

## 2016-04-23 DIAGNOSIS — Z7901 Long term (current) use of anticoagulants: Secondary | ICD-10-CM | POA: Diagnosis not present

## 2016-04-23 DIAGNOSIS — I2510 Atherosclerotic heart disease of native coronary artery without angina pectoris: Secondary | ICD-10-CM | POA: Diagnosis not present

## 2016-04-23 DIAGNOSIS — F801 Expressive language disorder: Secondary | ICD-10-CM | POA: Diagnosis not present

## 2016-04-23 DIAGNOSIS — N183 Chronic kidney disease, stage 3 (moderate): Secondary | ICD-10-CM | POA: Diagnosis not present

## 2016-04-23 DIAGNOSIS — E1122 Type 2 diabetes mellitus with diabetic chronic kidney disease: Secondary | ICD-10-CM | POA: Diagnosis not present

## 2016-04-23 DIAGNOSIS — Z951 Presence of aortocoronary bypass graft: Secondary | ICD-10-CM | POA: Diagnosis not present

## 2016-04-23 DIAGNOSIS — I129 Hypertensive chronic kidney disease with stage 1 through stage 4 chronic kidney disease, or unspecified chronic kidney disease: Secondary | ICD-10-CM | POA: Diagnosis not present

## 2016-04-27 DIAGNOSIS — I129 Hypertensive chronic kidney disease with stage 1 through stage 4 chronic kidney disease, or unspecified chronic kidney disease: Secondary | ICD-10-CM | POA: Diagnosis not present

## 2016-04-27 DIAGNOSIS — N183 Chronic kidney disease, stage 3 (moderate): Secondary | ICD-10-CM | POA: Diagnosis not present

## 2016-04-27 DIAGNOSIS — F801 Expressive language disorder: Secondary | ICD-10-CM | POA: Diagnosis not present

## 2016-04-27 DIAGNOSIS — Z951 Presence of aortocoronary bypass graft: Secondary | ICD-10-CM | POA: Diagnosis not present

## 2016-04-27 DIAGNOSIS — E1122 Type 2 diabetes mellitus with diabetic chronic kidney disease: Secondary | ICD-10-CM | POA: Diagnosis not present

## 2016-04-27 DIAGNOSIS — Z7984 Long term (current) use of oral hypoglycemic drugs: Secondary | ICD-10-CM | POA: Diagnosis not present

## 2016-04-27 DIAGNOSIS — I2510 Atherosclerotic heart disease of native coronary artery without angina pectoris: Secondary | ICD-10-CM | POA: Diagnosis not present

## 2016-04-27 DIAGNOSIS — Z7901 Long term (current) use of anticoagulants: Secondary | ICD-10-CM | POA: Diagnosis not present

## 2016-04-30 DIAGNOSIS — E559 Vitamin D deficiency, unspecified: Secondary | ICD-10-CM | POA: Diagnosis not present

## 2016-04-30 DIAGNOSIS — E538 Deficiency of other specified B group vitamins: Secondary | ICD-10-CM | POA: Diagnosis not present

## 2016-04-30 DIAGNOSIS — R269 Unspecified abnormalities of gait and mobility: Secondary | ICD-10-CM | POA: Diagnosis not present

## 2016-05-07 DIAGNOSIS — N183 Chronic kidney disease, stage 3 (moderate): Secondary | ICD-10-CM | POA: Diagnosis not present

## 2016-05-07 DIAGNOSIS — I129 Hypertensive chronic kidney disease with stage 1 through stage 4 chronic kidney disease, or unspecified chronic kidney disease: Secondary | ICD-10-CM | POA: Diagnosis not present

## 2016-05-07 DIAGNOSIS — Z7901 Long term (current) use of anticoagulants: Secondary | ICD-10-CM | POA: Diagnosis not present

## 2016-05-07 DIAGNOSIS — Z951 Presence of aortocoronary bypass graft: Secondary | ICD-10-CM | POA: Diagnosis not present

## 2016-05-07 DIAGNOSIS — E1122 Type 2 diabetes mellitus with diabetic chronic kidney disease: Secondary | ICD-10-CM | POA: Diagnosis not present

## 2016-05-07 DIAGNOSIS — Z7984 Long term (current) use of oral hypoglycemic drugs: Secondary | ICD-10-CM | POA: Diagnosis not present

## 2016-05-07 DIAGNOSIS — I2510 Atherosclerotic heart disease of native coronary artery without angina pectoris: Secondary | ICD-10-CM | POA: Diagnosis not present

## 2016-05-07 DIAGNOSIS — F801 Expressive language disorder: Secondary | ICD-10-CM | POA: Diagnosis not present

## 2016-06-19 ENCOUNTER — Other Ambulatory Visit: Payer: Self-pay | Admitting: Interventional Cardiology

## 2016-06-19 NOTE — Telephone Encounter (Signed)
Okay to refill? Please advise. Thanks, MI 

## 2016-06-19 NOTE — Telephone Encounter (Signed)
THis should go to his PCP.

## 2016-06-22 NOTE — Telephone Encounter (Signed)
**Note De-identified  Obfuscation** Please refer to PCP. Thanks. 

## 2016-08-06 ENCOUNTER — Other Ambulatory Visit: Payer: Self-pay

## 2016-08-06 MED ORDER — ISOSORBIDE DINITRATE 20 MG PO TABS: 20.0000 mg | ORAL_TABLET | Freq: Three times a day (TID) | ORAL | 0 refills | Status: DC

## 2016-08-18 ENCOUNTER — Other Ambulatory Visit: Payer: Self-pay | Admitting: Interventional Cardiology

## 2016-08-18 MED ORDER — ROSUVASTATIN CALCIUM 10 MG PO TABS: 5.0000 mg | ORAL_TABLET | Freq: Every day | ORAL | 0 refills | Status: DC

## 2016-08-26 ENCOUNTER — Other Ambulatory Visit: Payer: Self-pay | Admitting: *Deleted

## 2016-08-26 MED ORDER — CLOPIDOGREL BISULFATE 75 MG PO TABS: 75.0000 mg | ORAL_TABLET | Freq: Every day | ORAL | 2 refills | Status: DC

## 2016-10-19 ENCOUNTER — Other Ambulatory Visit: Payer: Self-pay | Admitting: Interventional Cardiology

## 2016-10-27 ENCOUNTER — Encounter: Payer: Self-pay | Admitting: Interventional Cardiology

## 2016-11-17 ENCOUNTER — Encounter: Payer: Self-pay | Admitting: Interventional Cardiology

## 2016-11-17 ENCOUNTER — Encounter (INDEPENDENT_AMBULATORY_CARE_PROVIDER_SITE_OTHER): Payer: Self-pay

## 2016-11-17 ENCOUNTER — Other Ambulatory Visit: Payer: Self-pay | Admitting: Interventional Cardiology

## 2016-11-17 ENCOUNTER — Ambulatory Visit (INDEPENDENT_AMBULATORY_CARE_PROVIDER_SITE_OTHER): Payer: Medicare Other | Admitting: Interventional Cardiology

## 2016-11-17 VITALS — BP 120/84 | HR 60 | Ht 64.0 in | Wt 136.8 lb

## 2016-11-17 DIAGNOSIS — Z951 Presence of aortocoronary bypass graft: Secondary | ICD-10-CM | POA: Diagnosis not present

## 2016-11-17 DIAGNOSIS — I10 Essential (primary) hypertension: Secondary | ICD-10-CM | POA: Diagnosis not present

## 2016-11-17 DIAGNOSIS — I2583 Coronary atherosclerosis due to lipid rich plaque: Secondary | ICD-10-CM | POA: Diagnosis not present

## 2016-11-17 DIAGNOSIS — I2510 Atherosclerotic heart disease of native coronary artery without angina pectoris: Secondary | ICD-10-CM

## 2016-11-17 DIAGNOSIS — E7800 Pure hypercholesterolemia, unspecified: Secondary | ICD-10-CM | POA: Diagnosis not present

## 2016-11-17 DIAGNOSIS — E1159 Type 2 diabetes mellitus with other circulatory complications: Secondary | ICD-10-CM

## 2016-11-17 MED ORDER — METOPROLOL TARTRATE 50 MG PO TABS: 25.0000 mg | ORAL_TABLET | Freq: Two times a day (BID) | ORAL | 3 refills | Status: DC

## 2016-11-17 MED ORDER — ISOSORBIDE DINITRATE 20 MG PO TABS: 20.0000 mg | ORAL_TABLET | Freq: Three times a day (TID) | ORAL | 3 refills | Status: DC

## 2016-11-17 MED ORDER — ROSUVASTATIN CALCIUM 10 MG PO TABS: 5.0000 mg | ORAL_TABLET | Freq: Every day | ORAL | 3 refills | Status: DC

## 2016-11-17 MED ORDER — CLOPIDOGREL BISULFATE 75 MG PO TABS: 75.0000 mg | ORAL_TABLET | Freq: Every day | ORAL | 3 refills | Status: DC

## 2016-11-17 MED ORDER — BENAZEPRIL HCL 20 MG PO TABS: 20.0000 mg | ORAL_TABLET | Freq: Every day | ORAL | 3 refills | Status: DC

## 2016-11-17 NOTE — Progress Notes (Signed)
Patient ID: Jerry Benson, male   DOB: 1929-04-23, 83 y.o.   MRN: [id number]     Cardiology Office Note   Date:  11/17/2016   ID:  Jerry Benson, DOB 11-02-1928, MRN [id number]  PCP:  Marden Noble, MD    No chief complaint on file. f/u CAD   Wt Readings from Last 3 Encounters:  11/17/16 136 lb 12.8 oz (62.1 kg)  04/14/16 130 lb 8.2 oz (59.2 kg)  11/19/15 137 lb (62.1 kg)       History of Present Illness: Jerry Benson is a 83 y.o. male  Who has had extensive CAD in the past.  He has ha dCABG in 1981 and redo surgery in 2009.  He had a DES in 2013.  He admits that his memory is poor.  He lives with his wife and daughter.  THe daughter has some health challenges as well.    THe patient occasionally has heart burn.  He has not used any SL NTG since last year.  THis is an improvement since the prior year.  He does not walk much. He complains of leg weakness.   He has fallen 17 times since last year.  He uses a walker.  He hit head head once.  No broken bones thus far.    He lives at home with his wife who also has health problems.  THey have a child with health problems as well.      Past Medical History:  Diagnosis Date  . Chest pain   . CHF (congestive heart failure) (HCC)   . CKD (chronic kidney disease) stage 3, GFR 30-59 ml/min   . Coronary artery disease    a. Remote MI 1981;  b. Prior CABG per Dr. Hendricks Milo in 1981;  c. redo CABG in 2009;  d. PCI to SVG to DX and OM in 2010;  e. PCI/DES VG-> OM (ISR) w/ 2.75x9mm Resolute DES Mar. 2013  . Diabetes mellitus   . GERD (gastroesophageal reflux disease)   . Hyperlipidemia   . Hypertension   . Status post insertion of non-drug eluting coronary artery stent 10/05/2008    Past Surgical History:  Procedure Laterality Date  . CARDIAC CATHETERIZATION  10/11/2008   PCI to SVG to diagonal and OM in 2010  . CORONARY ARTERY BYPASS GRAFT     original surgery in in 1981 with redo surgery in 2009. His original surgery was by Dr.  Hendricks Milo and he had a SVG to the RCA, SVG to DX/OM, and SVG to DX/LAD; Redo surgery per Dr. Laneta Simmers in 2009 with LIMA to LAD and SVG to PD  . LEFT HEART CATHETERIZATION WITH CORONARY/GRAFT ANGIOGRAM N/A 09/18/2011   Procedure: LEFT HEART CATHETERIZATION WITH Isabel Caprice;  Surgeon: Herby Abraham, MD;  Location: [facility] CATH LAB;  Service: Cardiovascular;  Laterality: N/A;  . PERCUTANEOUS CORONARY STENT INTERVENTION (PCI-S) N/A 09/21/2011   Procedure: PERCUTANEOUS CORONARY STENT INTERVENTION (PCI-S);  Surgeon: Herby Abraham, MD;  Location: [facility] CATH LAB;  Service: Cardiovascular;  Laterality: N/A;     Current Outpatient Prescriptions  Medication Sig Dispense Refill  . aspirin 81 MG tablet Take 81 mg by mouth daily.      . benazepril (LOTENSIN) 20 MG tablet Take 1 tablet (20 mg total) by mouth daily. 30 tablet 11  . clopidogrel (PLAVIX) 75 MG tablet Take 1 tablet (75 mg total) by mouth daily. 90 tablet 2  . donepezil (ARICEPT) 5 MG tablet Take 1 tablet (5  mg total) by mouth at bedtime. 30 tablet 0  . glipiZIDE (GLUCOTROL) 10 MG tablet Take 1 tablet (10 mg total) by mouth daily before breakfast. 30 tablet 6  . isosorbide dinitrate (ISORDIL) 20 MG tablet Take 1 tablet (20 mg total) by mouth 3 (three) times daily. Please contact office to schedule follow up appt  [phone] 1st attempt 90 tablet 0  . metFORMIN (GLUCOPHAGE) 500 MG tablet TAKE 1 TABLET BY MOUTH THREE TIMES DAILY WITH MEALS 270 tablet 0  . metoprolol (LOPRESSOR) 50 MG tablet TAKE 1/2 TABLET BY MOUTH TWICE DAILY 90 tablet 2  . Multiple Vitamins-Iron (MULTIVITAMIN/IRON PO) Take 1 tablet by mouth daily.     . nitroGLYCERIN (NITROSTAT) 0.4 MG SL tablet PLACE 1 TABLET UNDER THE TONGUE EVERY 5 MINUTES AS NEEDED FOR CHEST PAIN. MAX 3 DOSES BEFORE CALLING 911 75 tablet 0  . rosuvastatin (CRESTOR) 10 MG tablet Take 0.5 tablets (5 mg total) by mouth daily. 45 tablet 0   No current facility-administered medications for this visit.       Allergies:   Amlodipine; Lipitor [atorvastatin calcium]; and Lisinopril    Social History:  The patient  reports that he has never smoked. He has never used smokeless tobacco. He reports that he does not drink alcohol or use drugs.   Family History:  The patient's *family history includes Heart attack in his brother and father; Heart disease in his father and mother; Stroke in his daughter.    ROS:  Please see the history of present illness.   Otherwise, review of systems are positive for leg edema.   All other systems are reviewed and negative.    PHYSICAL EXAM: VS:  BP 120/84   Pulse 60   Ht 5\' 4"  (1.626 m)   Wt 136 lb 12.8 oz (62.1 kg)   BMI 23.48 kg/m  , BMI Body mass index is 23.48 kg/m. GEN: Frail  HEENT: normal  Neck: no JVD, carotid bruits, or masses Cardiac: RRR; no murmurs, rubs, or gallops,no edema  Respiratory:  clear to auscultation bilaterally, normal work of breathing GI: soft, nontender, nondistended, + BS MS: no deformity or atrophy  Skin: warm and dry, no rash Neuro:  Strength and sensation are intact; slow gait with walker Psych: euthymic mood, flat affect, poor memory   EKG:   The ekg ordered in October 2017 demonstrates normal sinus rhythm, lateral ST changes   Recent Labs: 04/14/2016: TSH 0.699 04/15/2016: ALT 13; BUN 15; Creatinine, Ser 1.06; Hemoglobin 12.2; Platelets 123; Potassium 3.8; Sodium 137   Lipid Panel    Component Value Date/Time   CHOL 129 04/15/2016 0444   TRIG 97 04/15/2016 0444   HDL 39 (L) 04/15/2016 0444   CHOLHDL 3.3 04/15/2016 0444   VLDL 19 04/15/2016 0444   LDLCALC 71 04/15/2016 0444     Other studies Reviewed: Additional studies/ records that were reviewed today with results demonstrating: .   ASSESSMENT AND PLAN:  1. CAD : Extensive coronary artery disease in the past. No clear angina on medical therapy. No use of sublingual nitroglycerin recently. Given his dementia, it is difficult to know exactly how much  relief he is having. He clearly is not limited by anginal symptoms. His physical activity is more limited by leg weakness.  Given his dementia, no invasive testing unless there were extenuating circumstances. 2. DM: Followed by Dr. Roselind Messier to be his new primary care doctor. 3. Hyperlipidemia: Lipids well controlled in 2016. Will recheck. Continue current lipid-lowering therapy.  4. HTN:  Elevated today. I've asked him to check his blood pressure at home. He does have a blood pressure cuff. He'll also follow-up with primary care doctor. 5. It is difficult for him to come to the office.  He can come back prn if Dr. Kevan NyGates can refill his meds.  We will be available for questions.   Current medicines are reviewed at length with the patient today.  The patient concerns regarding his medicines were addressed.  The following changes have been made:  No change  Labs/ tests ordered today include:  No orders of the defined types were placed in this encounter.   Recommend 150 minutes/week of aerobic exercise Low fat, low carb, high fiber diet recommended  Disposition:   FU in 1 year   Signed, Lance MussJayadeep Averly Ericson, MD  11/17/2016 10:44 AM    [facility] [address], [place], [place]  [address] Phone: [phone]) [phone]; Fax: [phone]) [phone]

## 2016-11-17 NOTE — Patient Instructions (Signed)
Medication Instructions:  Your physician recommends that you continue on your current medications as directed. Please refer to the Current Medication list given to you today.     Labwork: None ordered  Testing/Procedures: None ordered  Follow-Up: AS NEEDED   Any Other Special Instructions Will Be Listed Below (If Applicable).  PATIENT IS A PRN FOLLOW UP AND DOES NOT NEED APPOINTMENT TO HAVE MEDICATIONS REFILLED PER DR. VARANASI.   If you need a refill on your cardiac medications before your next appointment, please call your pharmacy.

## 2016-11-19 ENCOUNTER — Ambulatory Visit: Payer: Self-pay | Admitting: Interventional Cardiology

## 2016-12-03 ENCOUNTER — Other Ambulatory Visit: Payer: Self-pay | Admitting: Interventional Cardiology

## 2017-03-12 ENCOUNTER — Other Ambulatory Visit: Payer: Self-pay | Admitting: Interventional Cardiology

## 2017-04-08 ENCOUNTER — Other Ambulatory Visit: Payer: Self-pay | Admitting: Interventional Cardiology

## 2017-05-24 ENCOUNTER — Other Ambulatory Visit: Payer: Self-pay | Admitting: Interventional Cardiology

## 2017-11-08 ENCOUNTER — Emergency Department (HOSPITAL_COMMUNITY): Payer: Medicare Other

## 2017-11-08 ENCOUNTER — Encounter (HOSPITAL_COMMUNITY): Payer: Self-pay | Admitting: Emergency Medicine

## 2017-11-08 ENCOUNTER — Inpatient Hospital Stay (HOSPITAL_COMMUNITY)
Admission: EM | Admit: 2017-11-08 | Discharge: 2017-11-11 | DRG: 638 | Disposition: A | Discharge status: Skilled Nursing Facility | Payer: Medicare Other | Attending: Family Medicine | Admitting: Family Medicine

## 2017-11-08 DIAGNOSIS — E785 Hyperlipidemia, unspecified: Secondary | ICD-10-CM | POA: Diagnosis not present

## 2017-11-08 DIAGNOSIS — I509 Heart failure, unspecified: Secondary | ICD-10-CM | POA: Diagnosis present

## 2017-11-08 DIAGNOSIS — Z951 Presence of aortocoronary bypass graft: Secondary | ICD-10-CM | POA: Diagnosis not present

## 2017-11-08 DIAGNOSIS — W19XXXA Unspecified fall, initial encounter: Secondary | ICD-10-CM | POA: Diagnosis not present

## 2017-11-08 DIAGNOSIS — S2242XA Multiple fractures of ribs, left side, initial encounter for closed fracture: Secondary | ICD-10-CM

## 2017-11-08 DIAGNOSIS — E1159 Type 2 diabetes mellitus with other circulatory complications: Secondary | ICD-10-CM

## 2017-11-08 DIAGNOSIS — Z955 Presence of coronary angioplasty implant and graft: Secondary | ICD-10-CM

## 2017-11-08 DIAGNOSIS — E162 Hypoglycemia, unspecified: Secondary | ICD-10-CM | POA: Diagnosis not present

## 2017-11-08 DIAGNOSIS — R7989 Other specified abnormal findings of blood chemistry: Secondary | ICD-10-CM

## 2017-11-08 DIAGNOSIS — R52 Pain, unspecified: Secondary | ICD-10-CM

## 2017-11-08 DIAGNOSIS — N400 Enlarged prostate without lower urinary tract symptoms: Secondary | ICD-10-CM | POA: Diagnosis present

## 2017-11-08 DIAGNOSIS — D509 Iron deficiency anemia, unspecified: Secondary | ICD-10-CM | POA: Diagnosis present

## 2017-11-08 DIAGNOSIS — E1122 Type 2 diabetes mellitus with diabetic chronic kidney disease: Secondary | ICD-10-CM | POA: Diagnosis present

## 2017-11-08 DIAGNOSIS — N183 Chronic kidney disease, stage 3 unspecified: Secondary | ICD-10-CM | POA: Diagnosis present

## 2017-11-08 DIAGNOSIS — Y92009 Unspecified place in unspecified non-institutional (private) residence as the place of occurrence of the external cause: Secondary | ICD-10-CM

## 2017-11-08 DIAGNOSIS — Z8249 Family history of ischemic heart disease and other diseases of the circulatory system: Secondary | ICD-10-CM

## 2017-11-08 DIAGNOSIS — B0229 Other postherpetic nervous system involvement: Secondary | ICD-10-CM | POA: Diagnosis present

## 2017-11-08 DIAGNOSIS — Z7982 Long term (current) use of aspirin: Secondary | ICD-10-CM | POA: Diagnosis not present

## 2017-11-08 DIAGNOSIS — K219 Gastro-esophageal reflux disease without esophagitis: Secondary | ICD-10-CM | POA: Diagnosis not present

## 2017-11-08 DIAGNOSIS — S2232XA Fracture of one rib, left side, initial encounter for closed fracture: Secondary | ICD-10-CM | POA: Diagnosis not present

## 2017-11-08 DIAGNOSIS — E7800 Pure hypercholesterolemia, unspecified: Secondary | ICD-10-CM | POA: Diagnosis not present

## 2017-11-08 DIAGNOSIS — I130 Hypertensive heart and chronic kidney disease with heart failure and stage 1 through stage 4 chronic kidney disease, or unspecified chronic kidney disease: Secondary | ICD-10-CM | POA: Diagnosis present

## 2017-11-08 DIAGNOSIS — W1830XA Fall on same level, unspecified, initial encounter: Secondary | ICD-10-CM | POA: Diagnosis not present

## 2017-11-08 DIAGNOSIS — Z7902 Long term (current) use of antithrombotics/antiplatelets: Secondary | ICD-10-CM

## 2017-11-08 DIAGNOSIS — R262 Difficulty in walking, not elsewhere classified: Secondary | ICD-10-CM

## 2017-11-08 DIAGNOSIS — Z66 Do not resuscitate: Secondary | ICD-10-CM | POA: Diagnosis present

## 2017-11-08 DIAGNOSIS — E11649 Type 2 diabetes mellitus with hypoglycemia without coma: Secondary | ICD-10-CM | POA: Diagnosis not present

## 2017-11-08 DIAGNOSIS — R413 Other amnesia: Secondary | ICD-10-CM | POA: Diagnosis present

## 2017-11-08 DIAGNOSIS — I252 Old myocardial infarction: Secondary | ICD-10-CM

## 2017-11-08 DIAGNOSIS — Z7984 Long term (current) use of oral hypoglycemic drugs: Secondary | ICD-10-CM

## 2017-11-08 DIAGNOSIS — I2510 Atherosclerotic heart disease of native coronary artery without angina pectoris: Secondary | ICD-10-CM | POA: Diagnosis not present

## 2017-11-08 DIAGNOSIS — R778 Other specified abnormalities of plasma proteins: Secondary | ICD-10-CM

## 2017-11-08 DIAGNOSIS — Z9181 History of falling: Secondary | ICD-10-CM | POA: Diagnosis not present

## 2017-11-08 DIAGNOSIS — F0390 Unspecified dementia without behavioral disturbance: Secondary | ICD-10-CM | POA: Diagnosis not present

## 2017-11-08 DIAGNOSIS — Z888 Allergy status to other drugs, medicaments and biological substances status: Secondary | ICD-10-CM | POA: Diagnosis not present

## 2017-11-08 DIAGNOSIS — R296 Repeated falls: Secondary | ICD-10-CM | POA: Diagnosis present

## 2017-11-08 DIAGNOSIS — L8990 Pressure ulcer of unspecified site, unspecified stage: Secondary | ICD-10-CM | POA: Diagnosis present

## 2017-11-08 DIAGNOSIS — S2239XA Fracture of one rib, unspecified side, initial encounter for closed fracture: Secondary | ICD-10-CM | POA: Diagnosis present

## 2017-11-08 LAB — COMPREHENSIVE METABOLIC PANEL
ALT: 15 U/L — ABNORMAL LOW (ref 17–63)
AST: 25 U/L (ref 15–41)
Albumin: 3.8 g/dL (ref 3.5–5.0)
Alkaline Phosphatase: 78 U/L (ref 38–126)
Anion gap: 10 (ref 5–15)
BUN: 29 mg/dL — ABNORMAL HIGH (ref 6–20)
CHLORIDE: 104 mmol/L (ref 101–111)
CO2: 22 mmol/L (ref 22–32)
CREATININE: 1.39 mg/dL — AB (ref 0.61–1.24)
Calcium: 8.9 mg/dL (ref 8.9–10.3)
GFR, EST AFRICAN AMERICAN: 51 mL/min — AB (ref >60)
GFR, EST NON AFRICAN AMERICAN: 44 mL/min — AB (ref >60)
Glucose, Bld: 68 mg/dL (ref 65–99)
POTASSIUM: 4.2 mmol/L (ref 3.5–5.1)
Sodium: 136 mmol/L (ref 135–145)
TOTAL PROTEIN: 6.4 g/dL — AB (ref 6.5–8.1)
Total Bilirubin: 0.8 mg/dL (ref 0.3–1.2)

## 2017-11-08 LAB — CBG MONITORING, ED
GLUCOSE-CAPILLARY: 36 mg/dL — AB (ref 65–99)
GLUCOSE-CAPILLARY: 168 mg/dL — AB (ref 65–99)
Glucose-Capillary: 73 mg/dL (ref 65–99)
Glucose-Capillary: 191 mg/dL — ABNORMAL HIGH (ref 65–99)
Glucose-Capillary: 163 mg/dL — ABNORMAL HIGH (ref 65–99)

## 2017-11-08 LAB — URINALYSIS, ROUTINE W REFLEX MICROSCOPIC
BILIRUBIN URINE: NEGATIVE
GLUCOSE, UA: 50 mg/dL — AB
Hgb urine dipstick: NEGATIVE
Ketones, ur: NEGATIVE mg/dL
Leukocytes, UA: NEGATIVE
Nitrite: NEGATIVE
Protein, ur: NEGATIVE mg/dL
SPECIFIC GRAVITY, URINE: 1.016 (ref 1.005–1.030)
pH: 5 (ref 5.0–8.0)

## 2017-11-08 LAB — CBC WITH DIFFERENTIAL/PLATELET
Basophils Absolute: 0 10*3/uL (ref 0.0–0.1)
Basophils Relative: 0 %
EOS PCT: 0 %
Eosinophils Absolute: 0 10*3/uL (ref 0.0–0.7)
HCT: 37.2 % — ABNORMAL LOW (ref 39.0–52.0)
Hemoglobin: 12 g/dL — ABNORMAL LOW (ref 13.0–17.0)
LYMPHS ABS: 0.5 10*3/uL — AB (ref 0.7–4.0)
LYMPHS PCT: 8 %
MCH: 28.8 pg (ref 26.0–34.0)
MCHC: 32.3 g/dL (ref 30.0–36.0)
MCV: 89.2 fL (ref 78.0–100.0)
MONO ABS: 0.4 10*3/uL (ref 0.1–1.0)
Monocytes Relative: 6 %
Neutro Abs: 5.8 10*3/uL (ref 1.7–7.7)
Neutrophils Relative %: 86 %
PLATELETS: 134 10*3/uL — AB (ref 150–400)
RBC: 4.17 MIL/uL — ABNORMAL LOW (ref 4.22–5.81)
RDW: 12.9 % (ref 11.5–15.5)
WBC: 6.7 10*3/uL (ref 4.0–10.5)

## 2017-11-08 LAB — I-STAT CG4 LACTIC ACID, ED: Lactic Acid, Venous: 0.99 mmol/L (ref 0.5–1.9)

## 2017-11-08 LAB — TROPONIN I: Troponin I: 0.06 ng/mL (ref <0.03)

## 2017-11-08 LAB — AMMONIA: AMMONIA: 21 umol/L (ref 9–35)

## 2017-11-08 MED ORDER — TAMSULOSIN HCL 0.4 MG PO CAPS
0.4000 mg | ORAL_CAPSULE | Freq: Every day | ORAL | Status: DC
  Administered 2017-11-09 – 2017-11-11 (×3): 0.4 mg via ORAL
  Filled 2017-11-08 (×3): qty 1

## 2017-11-08 MED ORDER — ONDANSETRON HCL 4 MG/2ML IJ SOLN: 4.0000 mg | Freq: Four times a day (QID) | INTRAMUSCULAR | Status: DC | PRN

## 2017-11-08 MED ORDER — HEPARIN SODIUM (PORCINE) 5000 UNIT/ML IJ SOLN
5000.0000 [IU] | Freq: Three times a day (TID) | INTRAMUSCULAR | Status: DC
  Administered 2017-11-09 – 2017-11-11 (×7): 5000 [IU] via SUBCUTANEOUS
  Filled 2017-11-08 (×6): qty 1

## 2017-11-08 MED ORDER — BENAZEPRIL HCL 20 MG PO TABS
20.0000 mg | ORAL_TABLET | Freq: Every day | ORAL | Status: DC
  Administered 2017-11-09 – 2017-11-11 (×3): 20 mg via ORAL
  Filled 2017-11-08: qty 1
  Filled 2017-11-08: qty 2
  Filled 2017-11-08: qty 1
  Filled 2017-11-08: qty 2
  Filled 2017-11-08: qty 1
  Filled 2017-11-08: qty 2

## 2017-11-08 MED ORDER — DEXTROSE 10 % IV SOLN
INTRAVENOUS | Status: AC
  Administered 2017-11-08: 23:00:00 via INTRAVENOUS

## 2017-11-08 MED ORDER — NITROGLYCERIN 0.4 MG SL SUBL: 0.4000 mg | SUBLINGUAL_TABLET | SUBLINGUAL | Status: DC | PRN

## 2017-11-08 MED ORDER — ACETAMINOPHEN 325 MG PO TABS
650.0000 mg | ORAL_TABLET | Freq: Four times a day (QID) | ORAL | Status: DC | PRN
  Administered 2017-11-09 – 2017-11-11 (×2): 650 mg via ORAL
  Filled 2017-11-08 (×2): qty 2

## 2017-11-08 MED ORDER — DEXTROSE 50 % IV SOLN
1.0000 | Freq: Once | INTRAVENOUS | Status: AC
  Administered 2017-11-08: 50 mL via INTRAVENOUS
  Filled 2017-11-08: qty 50

## 2017-11-08 MED ORDER — ROSUVASTATIN CALCIUM 5 MG PO TABS
5.0000 mg | ORAL_TABLET | Freq: Every day | ORAL | Status: DC
  Administered 2017-11-09 – 2017-11-10 (×2): 5 mg via ORAL
  Filled 2017-11-08 (×3): qty 1

## 2017-11-08 MED ORDER — METOPROLOL SUCCINATE ER 25 MG PO TB24
25.0000 mg | ORAL_TABLET | Freq: Every day | ORAL | Status: DC
  Administered 2017-11-09 – 2017-11-11 (×3): 25 mg via ORAL
  Filled 2017-11-08 (×3): qty 1

## 2017-11-08 MED ORDER — FINASTERIDE 5 MG PO TABS
5.0000 mg | ORAL_TABLET | Freq: Every day | ORAL | Status: DC
  Administered 2017-11-09 – 2017-11-11 (×3): 5 mg via ORAL
  Filled 2017-11-08 (×3): qty 1

## 2017-11-08 MED ORDER — ACETAMINOPHEN 650 MG RE SUPP: 650.0000 mg | Freq: Four times a day (QID) | RECTAL | Status: DC | PRN

## 2017-11-08 MED ORDER — SERTRALINE HCL 50 MG PO TABS
50.0000 mg | ORAL_TABLET | Freq: Every day | ORAL | Status: DC
  Administered 2017-11-09 – 2017-11-11 (×3): 50 mg via ORAL
  Filled 2017-11-08 (×3): qty 1

## 2017-11-08 MED ORDER — TRAZODONE HCL 50 MG PO TABS
50.0000 mg | ORAL_TABLET | Freq: Every day | ORAL | Status: DC
  Administered 2017-11-09 – 2017-11-10 (×3): 50 mg via ORAL
  Filled 2017-11-08 (×3): qty 1

## 2017-11-08 MED ORDER — CLOPIDOGREL BISULFATE 75 MG PO TABS
75.0000 mg | ORAL_TABLET | Freq: Every day | ORAL | Status: DC
  Administered 2017-11-09 – 2017-11-11 (×3): 75 mg via ORAL
  Filled 2017-11-08 (×3): qty 1

## 2017-11-08 MED ORDER — DEXTROSE 10 % IV SOLN
INTRAVENOUS | Status: DC
  Administered 2017-11-08: 20:00:00 via INTRAVENOUS

## 2017-11-08 MED ORDER — DONEPEZIL HCL 5 MG PO TABS
5.0000 mg | ORAL_TABLET | Freq: Every day | ORAL | Status: DC
  Administered 2017-11-09 – 2017-11-10 (×3): 5 mg via ORAL
  Filled 2017-11-08 (×3): qty 1

## 2017-11-08 MED ORDER — ONDANSETRON HCL 4 MG PO TABS
4.0000 mg | ORAL_TABLET | Freq: Four times a day (QID) | ORAL | Status: DC | PRN
Start: 2017-11-08 — End: 2017-11-11

## 2017-11-08 MED ORDER — ISOSORBIDE MONONITRATE ER 30 MG PO TB24
30.0000 mg | ORAL_TABLET | Freq: Every day | ORAL | Status: DC
  Administered 2017-11-09 – 2017-11-11 (×3): 30 mg via ORAL
  Filled 2017-11-08 (×3): qty 1

## 2017-11-08 NOTE — ED Notes (Signed)
Patient transported to CT 

## 2017-11-08 NOTE — ED Notes (Signed)
ED Provider at bedside. 

## 2017-11-08 NOTE — H&P (Signed)
History and Physical    Jerry Benson:[id number] DOB: Jul 29, 1928 DOA: 11/08/2017  PCP: Marden Noble, MD  Patient coming from: Home.  Chief Complaint: Fall and low blood sugar.  HPI: Jerry Benson is a 84 y.o. male with history of CAD status post CABG, hypertension, diabetes mellitus, chronic kidney disease was brought to the ER after patient has been having persistent recurrent falls and also has been found to have low blood sugar.  As per the family who provided the history patient has been having recurrent falls last 2 weeks and has not been hardly eating anything.  Denies any nausea vomiting diarrhea fever or chills.  Denies losing consciousness.  Patient uses a walker to walk and usually trips on it.  ED Course: In the ER CT head was unremarkable x-ray on the left side showed 10th rib fracture and possible event.  Patient remained hypoglycemic persistently and was started on D10.  Patient admitted for further observation for hypoglycemia and rib fracture.  EKG shows normal sinus rhythm.  Troponin is mildly elevated.  Review of Systems: As per HPI, rest all negative.   Past Medical History:  Diagnosis Date  . Chest pain   . CHF (congestive heart failure) (HCC)   . CKD (chronic kidney disease) stage 3, GFR 30-59 ml/min (HCC)   . Coronary artery disease    a. Remote MI 1981;  b. Prior CABG per Dr. Hendricks Milo in 1981;  c. redo CABG in 2009;  d. PCI to SVG to DX and OM in 2010;  e. PCI/DES VG-> OM (ISR) w/ 2.75x36mm Resolute DES Mar. 2013  . Diabetes mellitus   . GERD (gastroesophageal reflux disease)   . Hyperlipidemia   . Hypertension   . Status post insertion of non-drug eluting coronary artery stent 10/05/2008    Past Surgical History:  Procedure Laterality Date  . CARDIAC CATHETERIZATION  10/11/2008   PCI to SVG to diagonal and OM in 2010  . CORONARY ARTERY BYPASS GRAFT     original surgery in in 1981 with redo surgery in 2009. His original surgery was by Dr. Hendricks Milo  and he had a SVG to the RCA, SVG to DX/OM, and SVG to DX/LAD; Redo surgery per Dr. Laneta Simmers in 2009 with LIMA to LAD and SVG to PD  . LEFT HEART CATHETERIZATION WITH CORONARY/GRAFT ANGIOGRAM N/A 09/18/2011   Procedure: LEFT HEART CATHETERIZATION WITH Isabel Caprice;  Surgeon: Herby Abraham, MD;  Location: [facility] CATH LAB;  Service: Cardiovascular;  Laterality: N/A;  . PERCUTANEOUS CORONARY STENT INTERVENTION (PCI-S) N/A 09/21/2011   Procedure: PERCUTANEOUS CORONARY STENT INTERVENTION (PCI-S);  Surgeon: Herby Abraham, MD;  Location: [facility] CATH LAB;  Service: Cardiovascular;  Laterality: N/A;     reports that he has never smoked. He has never used smokeless tobacco. He reports that he does not drink alcohol or use drugs.  Allergies  Allergen Reactions  . Amlodipine Other (See Comments)    headache  . Lipitor [Atorvastatin Calcium] Other (See Comments)    Unknown reaction  . Lisinopril Cough    Rash / cough. However patient is currently taking Lotensin with no problems    Family History  Problem Relation Age of Onset  . Heart disease Father   . Heart attack Father   . Heart disease Mother   . Stroke Daughter        09/2008  . Heart attack Brother     Prior to Admission medications   Medication Sig Start Date  End Date Taking? Authorizing Provider  benazepril (LOTENSIN) 40 MG tablet Take 20 mg by mouth daily. 10/14/17  Yes [provider]  clopidogrel (PLAVIX) 75 MG tablet Take 1 tablet (75 mg total) by mouth daily. 11/17/16  Yes Corky Crafts, MD  donepezil (ARICEPT) 5 MG tablet Take 1 tablet (5 mg total) by mouth at bedtime. 04/16/16  Yes Alison Murray, MD  finasteride (PROSCAR) 5 MG tablet Take 5 mg by mouth daily. 10/14/17  Yes [provider]  GLIPIZIDE XL 5 MG 24 hr tablet Take 5 mg by mouth daily. 10/14/17  Yes [provider]  isosorbide mononitrate (IMDUR) 30 MG 24 hr tablet Take 30 mg by mouth daily. 10/14/17  Yes [provider]    metFORMIN (GLUCOPHAGE) 500 MG tablet TAKE 1 TABLET BY MOUTH THREE TIMES DAILY WITH MEALS 08/16/15  Yes Cassell Clement, MD  metoprolol succinate (TOPROL-XL) 25 MG 24 hr tablet Take 25 mg by mouth daily. 10/14/17  Yes [provider]  nitroGLYCERIN (NITROSTAT) 0.4 MG SL tablet Place one (1) tablet under the tongue every 5 minutes as needed for chest pain max 3 doses in 15 before calling 911. 05/25/17  Yes Corky Crafts, MD  rosuvastatin (CRESTOR) 10 MG tablet Take 0.5 tablets (5 mg total) by mouth daily. 11/17/16  Yes Corky Crafts, MD  sertraline (ZOLOFT) 50 MG tablet Take 50 mg by mouth daily. 10/14/17  Yes [provider]  tamsulosin (FLOMAX) 0.4 MG CAPS capsule Take 0.4 mg by mouth daily. 10/14/17  Yes [provider]  traZODone (DESYREL) 50 MG tablet Take 50 mg by mouth at bedtime. 10/12/17  Yes [provider]  benazepril (LOTENSIN) 20 MG tablet Take 1 tablet (20 mg total) by mouth daily. Patient not taking: Reported on 11/08/2017 11/17/16   Corky Crafts, MD  glipiZIDE (GLUCOTROL) 10 MG tablet TAKE 1 TABLET BY MOUTH DAILY BEFORE BREAKFAST. Patient not taking: Reported on 11/08/2017 04/09/17   Corky Crafts, MD  isosorbide dinitrate (ISORDIL) 20 MG tablet Take 1 tablet (20 mg total) by mouth 3 (three) times daily. Patient not taking: Reported on 11/08/2017 11/17/16   Corky Crafts, MD  metoprolol tartrate (LOPRESSOR) 50 MG tablet Take 0.5 tablets (25 mg total) by mouth 2 (two) times daily. Patient not taking: Reported on 11/08/2017 11/17/16   Corky Crafts, MD  metoprolol tartrate (LOPRESSOR) 50 MG tablet Take 0.5 tablets (25 mg total) by mouth 2 (two) times daily. Patient not taking: Reported on 11/08/2017 03/12/17   Corky Crafts, MD  benazepril-hydrochlorthiazide (LOTENSIN HCT) 20-12.5 MG per tablet Take 1 tablet by mouth daily. 08/18/11 09/22/11  Cassell Clement, MD    Physical Exam: Vitals:   11/08/17 2130 11/08/17 2145  11/08/17 2200 11/08/17 2215  BP: (!) 149/66 (!) 143/76 140/61 126/73  Pulse: 65 68 67 65  Resp: (!) 22  Temp:      TempSrc:      SpO2: 100% 100% 100% 100%  Weight:      Height:          Constitutional: Moderately built and nourished. Vitals:   11/08/17 2130 11/08/17 2145 11/08/17 2200 11/08/17 2215  BP: (!) 149/66 (!) 143/76 140/61 126/73  Pulse: 65 68 67 65  Resp: (!) 22  Temp:      TempSrc:      SpO2: 100% 100% 100% 100%  Weight:      Height:  Eyes: Anicteric no pallor. ENMT: No discharge from the ears eyes nose or mouth. Neck: No mass palpated no JVD appreciated. Respiratory: No rhonchi or crepitations. Cardiovascular: S1-S2 heard no murmurs appreciated. Abdomen: Soft nontender bowel sounds present. Musculoskeletal: No edema.  No joint effusion.  Pain on the left side of ribs. Skin: No rash. Neurologic: Alert awake oriented to time place and person.  Moves all extremities.  Hard of hearing. Psychiatric: Appears normal.  Normal affect.   Labs on Admission: I have personally reviewed following labs and imaging studies  CBC: Recent Labs  Lab 11/08/17 1837  WBC 6.7  NEUTROABS 5.8  HGB 12.0*  HCT 37.2*  MCV 89.2  PLT 134*   Basic Metabolic Panel: Recent Labs  Lab 11/08/17 1837  NA 136  K 4.2  CL 104  CO2 22  GLUCOSE 68  BUN 29*  CREATININE 1.39*  CALCIUM 8.9   GFR: Estimated Creatinine Clearance: 33 mL/min (A) (by C-G formula based on SCr of 1.39 mg/dL (H)). Liver Function Tests: Recent Labs  Lab 11/08/17 1837  AST 25  ALT 15*  ALKPHOS 78  BILITOT 0.8  PROT 6.4*  ALBUMIN 3.8   No results for input(s): LIPASE, AMYLASE in the last 168 hours. Recent Labs  Lab 11/08/17 1837  AMMONIA 21   Coagulation Profile: No results for input(s): INR, PROTIME in the last 168 hours. Cardiac Enzymes: Recent Labs  Lab 11/08/17 1837  TROPONINI 0.06*   BNP (last 3 results) No results for input(s): PROBNP in the last 8760  hours. HbA1C: No results for input(s): HGBA1C in the last 72 hours. CBG: Recent Labs  Lab 11/08/17 1820 11/08/17 1953 11/08/17 2031 11/08/17 2148  GLUCAP 73 36* 191* 163*   Lipid Profile: No results for input(s): CHOL, HDL, LDLCALC, TRIG, CHOLHDL, LDLDIRECT in the last 72 hours. Thyroid Function Tests: No results for input(s): TSH, T4TOTAL, FREET4, T3FREE, THYROIDAB in the last 72 hours. Anemia Panel: No results for input(s): VITAMINB12, FOLATE, FERRITIN, TIBC, IRON, RETICCTPCT in the last 72 hours. Urine analysis:    Component Value Date/Time   COLORURINE YELLOW 11/08/2017 1837   APPEARANCEUR CLEAR 11/08/2017 1837   LABSPEC 1.016 11/08/2017 1837   PHURINE 5.0 11/08/2017 1837   GLUCOSEU 50 (A) 11/08/2017 1837   HGBUR NEGATIVE 11/08/2017 1837   BILIRUBINUR NEGATIVE 11/08/2017 1837   KETONESUR NEGATIVE 11/08/2017 1837   PROTEINUR NEGATIVE 11/08/2017 1837   NITRITE NEGATIVE 11/08/2017 1837   LEUKOCYTESUR NEGATIVE 11/08/2017 1837   Sepsis Labs: (procalcitonin:4,lacticidven:4) )No results found for this or any previous visit (from the past 240 hour(s)).   Radiological Exams on Admission: Dg Ribs Unilateral W/chest Left  Result Date: 11/08/2017 CLINICAL DATA:  Pain and bruising over the left ribs. EXAM: LEFT RIBS AND CHEST - 3+ VIEW COMPARISON:  CXR 09/18/2011 FINDINGS: Acute anterolateral angulated fracture of the left tenth rib an equivocal nondisplaced fracture of the posterior left third rib. Heart is enlarged but stable in appearance. Aortic atherosclerosis without aneurysm. Post CABG change is again noted. Lungs are clear without pulmonary consolidation. No effusion or pneumothorax. Cholecystectomy clips are present the right upper quadrant. Tubing projects over the periphery of the left thorax. IMPRESSION: Acute angulated left anterolateral tenth rib fracture with equivocal posterior eleventh rib fracture. No acute pulmonary abnormality. Stable cardiomegaly with  aortic atherosclerosis and post CABG change. Electronically Signed   By: Tollie Eth M.D.   On: 11/08/2017 20:30   Ct Head Wo Contrast  Result Date: 11/08/2017 CLINICAL DATA:  Hypoglycemia. Altered  level of consciousness. Reported falls over the past week. EXAM: CT HEAD WITHOUT CONTRAST TECHNIQUE: Contiguous axial images were obtained from the base of the skull through the vertex without intravenous contrast. COMPARISON:  04/15/2016 MRI FINDINGS: BRAIN: There is mild sulcal and ventricular prominence consistent with superficial and central atrophy. No intraparenchymal hemorrhage, mass effect nor midline shift. Periventricular and subcortical white matter hypodensities consistent with chronic small vessel ischemic disease are identified. No acute large vascular territory infarcts. No abnormal extra-axial fluid collections. Basal cisterns are not effaced and midline. VASCULAR: Moderate calcific atherosclerosis of the carotid siphons. SKULL: No skull fracture. No significant scalp soft tissue swelling. SINUSES/ORBITS: The mastoid air-cells are clear. The included paranasal sinuses are well-aerated.The included ocular globes and orbital contents are non-suspicious. Bilateral lens replacements noted. OTHER: None. IMPRESSION: Atrophy with chronic small vessel ischemia. No acute intracranial abnormality. Electronically Signed   By: Tollie Eth M.D.   On: 11/08/2017 21:14    EKG: Independently reviewed.  Normal sinus rhythm.  Assessment/Plan Principal Problem:   Hypoglycemia Active Problems:   Type 2 diabetes mellitus with vascular disease (HCC)   Hx of CABG   CKD (chronic kidney disease) stage 3, GFR 30-59 ml/min (HCC)   Iron deficiency anemia   Memory disorder   Fall at home, initial encounter   Closed rib fracture    1. Hypoglycemia -likely from poor oral intake with patient still taking sulfonylureas with renal disease.  Will hold antidiabetic medications and check hemoglobin A1c continue D10 for  now and I will recheck of CBC. 2. Recurrent falls -get physical therapy consult. 3. Left 10th rib fracture possible 11th -chest x-ray is pending.  Incentive spirometry.  Please discuss with trauma in the morning. 4. CAD status post CABG and stent placement -mildly elevated troponin.  Patient on Plavix beta-blocker statins and Imdur.  We will cycle cardiac markers. 5. Chronic kidney disease stage III -follow metabolic panel.  There is any further worsening may have to hold ACE inhibitor and hydrochlorothiazide. 6. Hypertension on ACE inhibitor hydrochlorothiazide metoprolol Imdur. 7. Dementia on Aricept. 8. Normocytic normochromic anemia -follow CBC. 9. BPH on finasteride and tamsulosin.   DVT prophylaxis: Lovenox. Code Status: DNR.  His wife confirms this. Family Communication: Patient's wife grandson and daughter. Disposition Plan: To be determined. Consults called: Physical therapy and palliative care. Admission status: Observation.   Eduard Clos MD [facility] Pager [phone].  If 7PM-7AM, please contact night-coverage [email or web address] Password [email or web address]  11/08/2017, 10:47 PM

## 2017-11-08 NOTE — ED Triage Notes (Signed)
Per EMS, pt from home - lives with wife and mentally disabled daughter. Pt reports generalized weakness and increased falls over the last week. Bruising to left ribs. Pt was hypoglycemic upon ems arrival cbg 48 - ems gave 12.5 of D10 and cbg went up to 230. Pt A&O x3 - not date. Pt smells of urine. Pt c/o pain to left side.

## 2017-11-08 NOTE — ED Notes (Signed)
Patient transported to X-ray 

## 2017-11-08 NOTE — ED Notes (Signed)
CBG 36 

## 2017-11-08 NOTE — ED Provider Notes (Signed)
[facility] EMERGENCY DEPARTMENT Provider Note   CSN: [id number] Arrival date & time: 11/08/17  1801     History   Chief Complaint Chief Complaint  Patient presents with  . Weakness  . Hypoglycemia  . Fall    HPI Jerry Benson is a 84 y.o. male.  Pt presents to the ED today with weakness and multiple falls.  Pt has a hx of dementia and lives with his wife and his disabled daughter.  The pt has had multiple falls for the past few years, but weakness is worse.  EMS was called and BS was initially 48.  EMS gave 12/5 mg of D10.  EMS said the living situation is not good.  Pt is unable to give much hx due to his dementia.     Past Medical History:  Diagnosis Date  . Chest pain   . CHF (congestive heart failure) (HCC)   . CKD (chronic kidney disease) stage 3, GFR 30-59 ml/min (HCC)   . Coronary artery disease    a. Remote MI 1981;  b. Prior CABG per Dr. Hendricks Milo in 1981;  c. redo CABG in 2009;  d. PCI to SVG to DX and OM in 2010;  e. PCI/DES VG-> OM (ISR) w/ 2.75x56mm Resolute DES Mar. 2013  . Diabetes mellitus   . GERD (gastroesophageal reflux disease)   . Hyperlipidemia   . Hypertension   . Status post insertion of non-drug eluting coronary artery stent 10/05/2008    Patient Active Problem List   Diagnosis Date Noted  . Word finding difficulty 04/14/2016  . Acute encephalopathy 04/14/2016  . Expressive aphasia 04/14/2016  . Hypertension 04/14/2016  . TIA (transient ischemic attack) 07/11/2015  . Memory disorder 05/28/2014  . Postherpetic neuralgia 09/25/2013  . Intertriginous candidiasis 01/12/2013  . Iron deficiency anemia 09/15/2012  . Malaise and fatigue 11/16/2011  . Unstable angina pectoris (HCC) 09/19/2011  . CKD (chronic kidney disease) stage 3, GFR 30-59 ml/min (HCC) 09/19/2011  . CAD (coronary artery disease) 01/21/2011  . Type 2 diabetes mellitus with vascular disease (HCC) 10/17/2010  . Hypercholesterolemia 10/17/2010  . Hx of CABG  10/17/2010  . Benign hypertensive heart disease without heart failure 10/17/2010  . Dyspepsia 10/17/2010    Past Surgical History:  Procedure Laterality Date  . CARDIAC CATHETERIZATION  10/11/2008   PCI to SVG to diagonal and OM in 2010  . CORONARY ARTERY BYPASS GRAFT     original surgery in in 1981 with redo surgery in 2009. His original surgery was by Dr. Hendricks Milo and he had a SVG to the RCA, SVG to DX/OM, and SVG to DX/LAD; Redo surgery per Dr. Laneta Simmers in 2009 with LIMA to LAD and SVG to PD  . LEFT HEART CATHETERIZATION WITH CORONARY/GRAFT ANGIOGRAM N/A 09/18/2011   Procedure: LEFT HEART CATHETERIZATION WITH Isabel Caprice;  Surgeon: Herby Abraham, MD;  Location: [facility] CATH LAB;  Service: Cardiovascular;  Laterality: N/A;  . PERCUTANEOUS CORONARY STENT INTERVENTION (PCI-S) N/A 09/21/2011   Procedure: PERCUTANEOUS CORONARY STENT INTERVENTION (PCI-S);  Surgeon: Herby Abraham, MD;  Location: [facility] CATH LAB;  Service: Cardiovascular;  Laterality: N/A;        Home Medications    Prior to Admission medications   Medication Sig Start Date End Date Taking? Authorizing Provider  aspirin 81 MG tablet Take 81 mg by mouth daily.      [provider]  benazepril (LOTENSIN) 20 MG tablet Take 1 tablet (20 mg total) by mouth daily.  11/17/16   Corky Crafts, MD  clopidogrel (PLAVIX) 75 MG tablet Take 1 tablet (75 mg total) by mouth daily. 11/17/16   Corky Crafts, MD  donepezil (ARICEPT) 5 MG tablet Take 1 tablet (5 mg total) by mouth at bedtime. 04/16/16   Alison Murray, MD  glipiZIDE (GLUCOTROL) 10 MG tablet TAKE 1 TABLET BY MOUTH DAILY BEFORE BREAKFAST. 04/09/17   Corky Crafts, MD  isosorbide dinitrate (ISORDIL) 20 MG tablet Take 1 tablet (20 mg total) by mouth 3 (three) times daily. 11/17/16   Corky Crafts, MD  metFORMIN (GLUCOPHAGE) 500 MG tablet TAKE 1 TABLET BY MOUTH THREE TIMES DAILY WITH MEALS 08/16/15   Cassell Clement, MD  metoprolol  tartrate (LOPRESSOR) 50 MG tablet Take 0.5 tablets (25 mg total) by mouth 2 (two) times daily. 11/17/16   Corky Crafts, MD  metoprolol tartrate (LOPRESSOR) 50 MG tablet Take 0.5 tablets (25 mg total) by mouth 2 (two) times daily. 03/12/17   Corky Crafts, MD  Multiple Vitamins-Iron (MULTIVITAMIN/IRON PO) Take 1 tablet by mouth daily.     [provider]  nitroGLYCERIN (NITROSTAT) 0.4 MG SL tablet Place one (1) tablet under the tongue every 5 minutes as needed for chest pain max 3 doses in 15 before calling 911. 05/25/17   Corky Crafts, MD  rosuvastatin (CRESTOR) 10 MG tablet Take 0.5 tablets (5 mg total) by mouth daily. 11/17/16   Corky Crafts, MD  benazepril-hydrochlorthiazide (LOTENSIN HCT) 20-12.5 MG per tablet Take 1 tablet by mouth daily. 08/18/11 09/22/11  Cassell Clement, MD    Family History Family History  Problem Relation Age of Onset  . Heart disease Father   . Heart attack Father   . Heart disease Mother   . Stroke Daughter        09/2008  . Heart attack Brother     Social History Social History   Tobacco Use  . Smoking status: Never Smoker  . Smokeless tobacco: Never Used  Substance Use Topics  . Alcohol use: No  . Drug use: No     Allergies   Amlodipine; Lipitor [atorvastatin calcium]; and Lisinopril   Review of Systems Review of Systems  Neurological: Positive for weakness.  All other systems reviewed and are negative.    Physical Exam Updated Vital Signs BP (!) 149/66   Pulse 65   Temp 97.9 F (36.6 C) (Oral)   Resp 14   Ht  (1.702 m)   Wt 61.2 kg (135 lb)   SpO2 100%   BMI 21.14 kg/m   Physical Exam  Constitutional: He appears well-developed and well-nourished.  HENT:  Head: Normocephalic and atraumatic.  Right Ear: External ear normal.  Left Ear: External ear normal.  Nose: Nose normal.  Mouth/Throat: Mucous membranes are dry.  Eyes: Pupils are equal, round, and reactive to light. Conjunctivae  and EOM are normal.  Neck: Normal range of motion. Neck supple.  Cardiovascular: Regular rhythm, normal heart sounds and intact distal pulses. Tachycardia present.  Pulmonary/Chest: Effort normal and breath sounds normal.  Abdominal: Soft. Bowel sounds are normal.  Musculoskeletal: Normal range of motion.  Old bruising to left ribs  Neurological: He is alert.  Pt knows his name.  He knows he is in a hospital.  He thinks it is 2011.  Skin: Skin is warm. Capillary refill takes less than 2 seconds.  Psychiatric: He has a normal mood and affect.  Nursing note and vitals reviewed.    ED  Treatments / Results  Labs (all labs ordered are listed, but only abnormal results are displayed) Labs Reviewed  CBC WITH DIFFERENTIAL/PLATELET - Abnormal; Notable for the following components:      Result Value   RBC 4.17 (*)    Hemoglobin 12.0 (*)    HCT 37.2 (*)    Platelets 134 (*)    Lymphs Abs 0.5 (*)    All other components within normal limits  COMPREHENSIVE METABOLIC PANEL - Abnormal; Notable for the following components:   BUN 29 (*)    Creatinine, Ser 1.39 (*)    Total Protein 6.4 (*)    ALT 15 (*)    GFR calc non Af Amer 44 (*)    GFR calc Af Amer 51 (*)    All other components within normal limits  URINALYSIS, ROUTINE W REFLEX MICROSCOPIC - Abnormal; Notable for the following components:   Glucose, UA 50 (*)    All other components within normal limits  TROPONIN I - Abnormal; Notable for the following components:   Troponin I 0.06 (*)    All other components within normal limits  CBG MONITORING, ED - Abnormal; Notable for the following components:   Glucose-Capillary 36 (*)    All other components within normal limits  CBG MONITORING, ED - Abnormal; Notable for the following components:   Glucose-Capillary 191 (*)    All other components within normal limits  URINE CULTURE  AMMONIA  CBG MONITORING, ED  I-STAT CG4 LACTIC ACID, ED  I-STAT CG4 LACTIC ACID, ED  CBG MONITORING,  ED  CBG MONITORING, ED    EKG EKG Interpretation  Date/Time:  Monday Nov 08 2017 18:16:03 EDT Ventricular Rate:  64 PR Interval:    QRS Duration: 120 QT Interval:  434 QTC Calculation: 448 R Axis:   4 Text Interpretation:  Sinus rhythm Nonspecific intraventricular conduction delay Probable lateral infarct, age indeterminate Borderline ST elevation, anterior leads t wave inversions in v3 and in v4 are new from 04/14/16 Confirmed by Jacalyn Lefevre [phone]) on 11/08/2017 6:18:25 PM   Radiology Dg Ribs Unilateral W/chest Left  Result Date: 11/08/2017 CLINICAL DATA:  Pain and bruising over the left ribs. EXAM: LEFT RIBS AND CHEST - 3+ VIEW COMPARISON:  CXR 09/18/2011 FINDINGS: Acute anterolateral angulated fracture of the left tenth rib an equivocal nondisplaced fracture of the posterior left third rib. Heart is enlarged but stable in appearance. Aortic atherosclerosis without aneurysm. Post CABG change is again noted. Lungs are clear without pulmonary consolidation. No effusion or pneumothorax. Cholecystectomy clips are present the right upper quadrant. Tubing projects over the periphery of the left thorax. IMPRESSION: Acute angulated left anterolateral tenth rib fracture with equivocal posterior eleventh rib fracture. No acute pulmonary abnormality. Stable cardiomegaly with aortic atherosclerosis and post CABG change. Electronically Signed   By: Tollie Eth M.D.   On: 11/08/2017 20:30   Ct Head Wo Contrast  Result Date: 11/08/2017 CLINICAL DATA:  Hypoglycemia. Altered level of consciousness. Reported falls over the past week. EXAM: CT HEAD WITHOUT CONTRAST TECHNIQUE: Contiguous axial images were obtained from the base of the skull through the vertex without intravenous contrast. COMPARISON:  04/15/2016 MRI FINDINGS: BRAIN: There is mild sulcal and ventricular prominence consistent with superficial and central atrophy. No intraparenchymal hemorrhage, mass effect nor midline shift. Periventricular and  subcortical white matter hypodensities consistent with chronic small vessel ischemic disease are identified. No acute large vascular territory infarcts. No abnormal extra-axial fluid collections. Basal cisterns are not effaced and midline. VASCULAR: Moderate  calcific atherosclerosis of the carotid siphons. SKULL: No skull fracture. No significant scalp soft tissue swelling. SINUSES/ORBITS: The mastoid air-cells are clear. The included paranasal sinuses are well-aerated.The included ocular globes and orbital contents are non-suspicious. Bilateral lens replacements noted. OTHER: None. IMPRESSION: Atrophy with chronic small vessel ischemia. No acute intracranial abnormality. Electronically Signed   By: Tollie Eth M.D.   On: 11/08/2017 21:14    Procedures Procedures (including critical care time)  Medications Ordered in ED Medications  dextrose 10 % infusion ( Intravenous New Bag/Given 11/08/17 1957)  dextrose 50 % solution 50 mL (50 mLs Intravenous Given 11/08/17 1957)     Initial Impression / Assessment and Plan / ED Course  I have reviewed the triage vital signs and the nursing notes.  Pertinent labs & imaging results that were available during my care of the patient were reviewed by me and considered in my medical decision making (see chart for details).  Blood sugar still low after treatment.  Pt's wife said he has not eaten today, but he has had his medications.  She said she needs help cooking because she can't do it.  He was given an additional amp of D50 and started on a d10 drip.  Pt also has at least 1 rib fx, likely 2.  Pt said he wants to go home after d/c.  He and his wife will need help at home if this is the case.    Pt d/w Dr. Toniann Fail (triad) for admission.   CRITICAL CARE Performed by: Jacalyn Lefevre   Total critical care time: 30 minutes  Critical care time was exclusive of separately billable procedures and treating other patients.  Critical care was necessary to treat  or prevent imminent or life-threatening deterioration.  Critical care was time spent personally by me on the following activities: development of treatment plan with patient and/or surrogate as well as nursing, discussions with consultants, evaluation of patient's response to treatment, examination of patient, obtaining history from patient or surrogate, ordering and performing treatments and interventions, ordering and review of laboratory studies, ordering and review of radiographic studies, pulse oximetry and re-evaluation of patient's condition.  Final Clinical Impressions(s) / ED Diagnoses   Final diagnoses:  Hypoglycemia  Closed fracture of multiple ribs of left side, initial encounter  Elevated troponin  Ambulatory dysfunction  Fall, initial encounter    ED Discharge Orders    None       Jacalyn Lefevre, MD 11/08/17 2145

## 2017-11-08 NOTE — ED Notes (Signed)
Pt CBG was 73, notified Caitlynn(RN)

## 2017-11-09 ENCOUNTER — Observation Stay (HOSPITAL_COMMUNITY): Payer: Medicare Other

## 2017-11-09 DIAGNOSIS — I130 Hypertensive heart and chronic kidney disease with heart failure and stage 1 through stage 4 chronic kidney disease, or unspecified chronic kidney disease: Secondary | ICD-10-CM | POA: Diagnosis not present

## 2017-11-09 DIAGNOSIS — Z7902 Long term (current) use of antithrombotics/antiplatelets: Secondary | ICD-10-CM | POA: Diagnosis not present

## 2017-11-09 DIAGNOSIS — W1830XA Fall on same level, unspecified, initial encounter: Secondary | ICD-10-CM | POA: Diagnosis not present

## 2017-11-09 DIAGNOSIS — I252 Old myocardial infarction: Secondary | ICD-10-CM | POA: Diagnosis not present

## 2017-11-09 DIAGNOSIS — R296 Repeated falls: Secondary | ICD-10-CM | POA: Diagnosis not present

## 2017-11-09 DIAGNOSIS — E11649 Type 2 diabetes mellitus with hypoglycemia without coma: Secondary | ICD-10-CM | POA: Diagnosis not present

## 2017-11-09 DIAGNOSIS — E785 Hyperlipidemia, unspecified: Secondary | ICD-10-CM | POA: Diagnosis not present

## 2017-11-09 DIAGNOSIS — F0390 Unspecified dementia without behavioral disturbance: Secondary | ICD-10-CM | POA: Diagnosis not present

## 2017-11-09 DIAGNOSIS — N183 Chronic kidney disease, stage 3 (moderate): Secondary | ICD-10-CM | POA: Diagnosis not present

## 2017-11-09 DIAGNOSIS — I2510 Atherosclerotic heart disease of native coronary artery without angina pectoris: Secondary | ICD-10-CM | POA: Diagnosis not present

## 2017-11-09 DIAGNOSIS — K219 Gastro-esophageal reflux disease without esophagitis: Secondary | ICD-10-CM | POA: Diagnosis not present

## 2017-11-09 DIAGNOSIS — S2242XA Multiple fractures of ribs, left side, initial encounter for closed fracture: Secondary | ICD-10-CM | POA: Diagnosis not present

## 2017-11-09 DIAGNOSIS — E7800 Pure hypercholesterolemia, unspecified: Secondary | ICD-10-CM | POA: Diagnosis not present

## 2017-11-09 DIAGNOSIS — Z955 Presence of coronary angioplasty implant and graft: Secondary | ICD-10-CM | POA: Diagnosis not present

## 2017-11-09 DIAGNOSIS — I509 Heart failure, unspecified: Secondary | ICD-10-CM | POA: Diagnosis not present

## 2017-11-09 DIAGNOSIS — B0229 Other postherpetic nervous system involvement: Secondary | ICD-10-CM | POA: Diagnosis not present

## 2017-11-09 DIAGNOSIS — Z7982 Long term (current) use of aspirin: Secondary | ICD-10-CM | POA: Diagnosis not present

## 2017-11-09 DIAGNOSIS — Z951 Presence of aortocoronary bypass graft: Secondary | ICD-10-CM | POA: Diagnosis not present

## 2017-11-09 DIAGNOSIS — E162 Hypoglycemia, unspecified: Secondary | ICD-10-CM | POA: Diagnosis present

## 2017-11-09 DIAGNOSIS — Z7984 Long term (current) use of oral hypoglycemic drugs: Secondary | ICD-10-CM | POA: Diagnosis not present

## 2017-11-09 DIAGNOSIS — L8990 Pressure ulcer of unspecified site, unspecified stage: Secondary | ICD-10-CM

## 2017-11-09 LAB — CBC
HCT: 33.3 % — ABNORMAL LOW (ref 39.0–52.0)
HEMATOCRIT: 33.3 % — AB (ref 39.0–52.0)
HEMOGLOBIN: 10.6 g/dL — AB (ref 13.0–17.0)
HEMOGLOBIN: 10.8 g/dL — AB (ref 13.0–17.0)
MCH: 28.3 pg (ref 26.0–34.0)
MCH: 28.9 pg (ref 26.0–34.0)
MCHC: 32.4 g/dL (ref 30.0–36.0)
MCHC: 31.8 g/dL (ref 30.0–36.0)
MCV: 88.8 fL (ref 78.0–100.0)
MCV: 89 fL (ref 78.0–100.0)
PLATELETS: 111 10*3/uL — AB (ref 150–400)
Platelets: 124 10*3/uL — ABNORMAL LOW (ref 150–400)
RBC: 3.75 MIL/uL — ABNORMAL LOW (ref 4.22–5.81)
RBC: 3.74 MIL/uL — AB (ref 4.22–5.81)
RDW: 12.9 % (ref 11.5–15.5)
RDW: 12.9 % (ref 11.5–15.5)
WBC: 4.7 10*3/uL (ref 4.0–10.5)
WBC: 4.9 10*3/uL (ref 4.0–10.5)

## 2017-11-09 LAB — CBG MONITORING, ED
GLUCOSE-CAPILLARY: 188 mg/dL — AB (ref 65–99)
GLUCOSE-CAPILLARY: 273 mg/dL — AB (ref 65–99)
GLUCOSE-CAPILLARY: 242 mg/dL — AB (ref 65–99)
Glucose-Capillary: 227 mg/dL — ABNORMAL HIGH (ref 65–99)

## 2017-11-09 LAB — CREATININE, SERUM
Creatinine, Ser: 1.31 mg/dL — ABNORMAL HIGH (ref 0.61–1.24)
GFR calc Af Amer: 55 mL/min — ABNORMAL LOW (ref >60)
GFR calc non Af Amer: 48 mL/min — ABNORMAL LOW (ref >60)

## 2017-11-09 LAB — GLUCOSE, CAPILLARY
GLUCOSE-CAPILLARY: 201 mg/dL — AB (ref 65–99)
GLUCOSE-CAPILLARY: 243 mg/dL — AB (ref 65–99)
Glucose-Capillary: 213 mg/dL — ABNORMAL HIGH (ref 65–99)
Glucose-Capillary: 344 mg/dL — ABNORMAL HIGH (ref 65–99)
Glucose-Capillary: 232 mg/dL — ABNORMAL HIGH (ref 65–99)
Glucose-Capillary: 291 mg/dL — ABNORMAL HIGH (ref 65–99)

## 2017-11-09 LAB — BASIC METABOLIC PANEL
Anion gap: 10 (ref 5–15)
BUN: 23 mg/dL — ABNORMAL HIGH (ref 6–20)
CHLORIDE: 102 mmol/L (ref 101–111)
CO2: 25 mmol/L (ref 22–32)
Calcium: 8.5 mg/dL — ABNORMAL LOW (ref 8.9–10.3)
Creatinine, Ser: 1.27 mg/dL — ABNORMAL HIGH (ref 0.61–1.24)
GFR calc Af Amer: 57 mL/min — ABNORMAL LOW (ref >60)
GFR, EST NON AFRICAN AMERICAN: 49 mL/min — AB (ref >60)
GLUCOSE: 211 mg/dL — AB (ref 65–99)
POTASSIUM: 3.6 mmol/L (ref 3.5–5.1)
Sodium: 137 mmol/L (ref 135–145)

## 2017-11-09 LAB — TROPONIN I
TROPONIN I: 0.05 ng/mL — AB (ref <0.03)
TROPONIN I: 0.04 ng/mL — AB (ref <0.03)
Troponin I: 0.05 ng/mL (ref <0.03)

## 2017-11-09 LAB — HEMOGLOBIN A1C
Hgb A1c MFr Bld: 6.8 % — ABNORMAL HIGH (ref 4.8–5.6)
Mean Plasma Glucose: 148.46 mg/dL

## 2017-11-09 LAB — TSH: TSH: 0.686 u[IU]/mL (ref 0.350–4.500)

## 2017-11-09 LAB — MRSA PCR SCREENING: MRSA BY PCR: NEGATIVE

## 2017-11-09 MED ORDER — INSULIN ASPART 100 UNIT/ML ~~LOC~~ SOLN
0.0000 [IU] | Freq: Three times a day (TID) | SUBCUTANEOUS | Status: DC
  Administered 2017-11-10: 1 [IU] via SUBCUTANEOUS
  Administered 2017-11-10: 3 [IU] via SUBCUTANEOUS
  Administered 2017-11-11 (×2): 1 [IU] via SUBCUTANEOUS

## 2017-11-09 MED ORDER — INSULIN ASPART 100 UNIT/ML ~~LOC~~ SOLN
0.0000 [IU] | Freq: Every day | SUBCUTANEOUS | Status: DC
  Administered 2017-11-09: 3 [IU] via SUBCUTANEOUS

## 2017-11-09 MED ORDER — LIDOCAINE 5 % EX PTCH
1.0000 | MEDICATED_PATCH | CUTANEOUS | Status: DC
  Administered 2017-11-09 – 2017-11-10 (×2): 1 via TRANSDERMAL
  Filled 2017-11-09 (×2): qty 1

## 2017-11-09 NOTE — NC FL2 (Signed)
[place] MEDICAID FL2 LEVEL OF CARE SCREENING TOOL     IDENTIFICATION  Patient Name: Jerry Benson Birthdate: 12/30/1928 Sex: male Admission Date (Current Location): 11/08/2017  [facility] and [place] Number:  Producer, television/film/video and Address:  [facility] [place], [address] [place], [place] [address]      Provider Number: [id number]  Attending Physician Name and Address:  Eduard Clos, MD  Relative Name and Phone Number:  Tildon Silveria, spouse, [phone]    Current Level of Care: Hospital Recommended Level of Care: Skilled Nursing Facility Prior Approval Number:    Date Approved/Denied:   PASRR Number: [id number]  Discharge Plan: SNF    Current Diagnoses: Patient Active Problem List   Diagnosis Date Noted  . Hypoglycemia 11/08/2017  . Fall at home, initial encounter 11/08/2017  . Closed rib fracture 11/08/2017  . Word finding difficulty 04/14/2016  . Acute encephalopathy 04/14/2016  . Expressive aphasia 04/14/2016  . Hypertension 04/14/2016  . TIA (transient ischemic attack) 07/11/2015  . Memory disorder 05/28/2014  . Postherpetic neuralgia 09/25/2013  . Intertriginous candidiasis 01/12/2013  . Iron deficiency anemia 09/15/2012  . Malaise and fatigue 11/16/2011  . Unstable angina pectoris (HCC) 09/19/2011  . CKD (chronic kidney disease) stage 3, GFR 30-59 ml/min (HCC) 09/19/2011  . CAD (coronary artery disease) 01/21/2011  . Type 2 diabetes mellitus with vascular disease (HCC) 10/17/2010  . Hypercholesterolemia 10/17/2010  . Hx of CABG 10/17/2010  . Benign hypertensive heart disease without heart failure 10/17/2010  . Dyspepsia 10/17/2010    Orientation RESPIRATION BLADDER Height & Weight     Self, Time, Situation, Place  Normal Continent Weight: 126 lb 1.7 oz (57.2 kg) Height:   (170.2 cm)  BEHAVIORAL SYMPTOMS/MOOD NEUROLOGICAL BOWEL NUTRITION STATUS      Continent Diet(please see DC summary)  AMBULATORY STATUS  COMMUNICATION OF NEEDS Skin   Extensive Assist Verbally                         Personal Care Assistance Level of Assistance  Bathing, Feeding, Dressing Bathing Assistance: Maximum assistance Feeding assistance: Limited assistance Dressing Assistance: Maximum assistance     Functional Limitations Info  Sight, Hearing, Speech Sight Info: Adequate Hearing Info: Adequate Speech Info: Adequate    SPECIAL CARE FACTORS FREQUENCY  PT (By licensed PT)     PT Frequency: 5x/week              Contractures Contractures Info: Not present    Additional Factors Info  Code Status, Allergies, Psychotropic Code Status Info: DNR Allergies Info: Amlodipine, Lipitor Atorvastatin Calcium, Lisinopril Psychotropic Info: zoloft, trazadone         Current Medications (11/09/2017):  This is the current hospital active medication list Current Facility-Administered Medications  Medication Dose Route Frequency Provider Last Rate Last Dose  . acetaminophen (TYLENOL) tablet 650 mg  650 mg Oral Q6H PRN Eduard Clos, MD   650 mg at 11/09/17 8469   Or  . acetaminophen (TYLENOL) suppository 650 mg  650 mg Rectal Q6H PRN Eduard Clos, MD      . benazepril (LOTENSIN) tablet 20 mg  20 mg Oral Daily Eduard Clos, MD   20 mg at 11/09/17 0953  . clopidogrel (PLAVIX) tablet 75 mg  75 mg Oral Daily Eduard Clos, MD   75 mg at 11/09/17 6295  . dextrose 10 % infusion   Intravenous Continuous Eduard Clos, MD  Stopped at 11/09/17 0711  . donepezil (ARICEPT) tablet 5 mg  5 mg Oral QHS Eduard Clos, MD   5 mg at 11/09/17 0140  . finasteride (PROSCAR) tablet 5 mg  5 mg Oral Daily Eduard Clos, MD   5 mg at 11/09/17 0952  . heparin injection 5,000 Units  5,000 Units Subcutaneous Q8H Eduard Clos, MD   5,000 Units at 11/09/17 [phone]  . isosorbide mononitrate (IMDUR) 24 hr tablet 30 mg  30 mg Oral Daily Eduard Clos, MD   30 mg at 11/09/17 9604   . metoprolol succinate (TOPROL-XL) 24 hr tablet 25 mg  25 mg Oral Daily Eduard Clos, MD   25 mg at 11/09/17 5409  . nitroGLYCERIN (NITROSTAT) SL tablet 0.4 mg  0.4 mg Sublingual Q5 min PRN Eduard Clos, MD      . ondansetron [facility]) tablet 4 mg  4 mg Oral Q6H PRN Eduard Clos, MD       Or  . ondansetron [facility]) injection 4 mg  4 mg Intravenous Q6H PRN Eduard Clos, MD      . rosuvastatin (CRESTOR) tablet 5 mg  5 mg Oral q1800 Eduard Clos, MD      . sertraline (ZOLOFT) tablet 50 mg  50 mg Oral Daily Eduard Clos, MD   50 mg at 11/09/17 8119  . tamsulosin (FLOMAX) capsule 0.4 mg  0.4 mg Oral Daily Eduard Clos, MD   0.4 mg at 11/09/17 1478  . traZODone (DESYREL) tablet 50 mg  50 mg Oral QHS Eduard Clos, MD   50 mg at 11/09/17 0140     Discharge Medications: Please see discharge summary for a list of discharge medications.  Relevant Imaging Results:  Relevant Lab Results:   Additional Information SSN: [id number]  Abigail Butts, LCSW

## 2017-11-09 NOTE — Clinical Social Work Note (Signed)
Clinical Social Work Assessment  Patient Details  Name: Jerry Benson MRN: [id number] Date of Birth: 08/18/1928  Date of referral:  11/09/17               Reason for consult:  Facility Placement                Permission sought to share information with:  Facility Sport and exercise psychologist, Family Supports Permission granted to share information::  Yes, Verbal Permission Granted  Name::     Jamaine Quintin  Agency::  SNF  Relationship::  spouse  Contact Information:  [phone]  Housing/Transportation Living arrangements for the past 2 months:  Single Family Home Source of Information:  Patient, Spouse, Other (Comment Required) Patient Interpreter Needed:  None Criminal Activity/Legal Involvement Pertinent to Current Situation/Hospitalization:  No - Comment as needed Significant Relationships:  Adult Children, Spouse Lives with:  Spouse Do you feel safe going back to the place where you live?  Yes Need for family participation in patient care:  Yes (Comment)  Care giving concerns: Patient from home with wife and adult daughter with intellectual disability. PT recommending SNF.  Social Worker assessment / plan: CSW met with patient and family members - spouse, daughter, grandson - at bedside. Patient alert and oriented, though somewhat confused about situation. CSW introduced self and role and discussed recommendation for SNF. Patient and family agreeable to short term SNF. CSW sent out initial referrals. CSW to follow up with bed offers and will support with discharge.  Employment status:  Retired Research officer, political party) PT Recommendations:  [place] / Referral to community resources:  [place]  Patient/Family's Response to care: Patient and family appreciative of care.  Patient/Family's Understanding of and Emotional Response to Diagnosis, Current Treatment, and Prognosis: Patient and family with understanding of  patient's condition and agreeable to SNF.  Emotional Assessment Appearance:  Appears stated age Attitude/Demeanor/Rapport:  Engaged Affect (typically observed):  Calm, Appropriate, Pleasant Orientation:  Oriented to Situation, Oriented to Self, Oriented to Place, Oriented to  Time Alcohol / Substance use:  Not Applicable Psych involvement (Current and /or in the community):  No (Comment)  Discharge Needs  Concerns to be addressed:  Discharge Planning Concerns, Care Coordination Readmission within the last 30 days:  No Current discharge risk:  Physical Impairment Barriers to Discharge:  Continued Medical Work up   Estanislado Emms, LCSW 11/09/2017, 4:46 PM

## 2017-11-09 NOTE — Progress Notes (Signed)
Inpatient Diabetes Program Recommendations  AACE/ADA: New Consensus Statement on Inpatient Glycemic Control (2015)  Target Ranges:  Prepandial:   less than 140 mg/dL      Peak postprandial:   less than 180 mg/dL (1-2 hours)      Critically ill patients:  140 - 180 mg/dL   Results for Jerry Benson, Jerry Benson (MRN [id number]) as of 11/09/2017 10:18  Ref. Range 11/08/2017 22:52 11/09/2017 01:43 11/09/2017 03:36 11/09/2017 04:54 11/09/2017 06:28 11/09/2017 08:59  Glucose-Capillary Latest Ref Range: 65 - 99 mg/dL 409 (H) 811 (H) 914 (H) 242 (H) 227 (H) 201 (H)   Review of Glycemic Control  Diabetes history: DM 2 Outpatient Diabetes medications: Glipizide 5 mg Daily (was taking 10 mg on med rec), Metformin 500 mg tid with meals Current orders for Inpatient glycemic control: Was on D10 infusion, stopped at 0711  Inpatient Diabetes Program Recommendations:    A1c 6.8%, lower than a target usually is for an 84 year old, indicating some hypoglycemia at home. Patient taking sulfonylurea at home. Would d/c Glipizide at time of d/c.  Last glucose 201 at 0859. CBGs monitored. If glucose remains above 180 consistently today, consider Novolog Sensitive Correction 0-9 units tid.  Thanks,  Christena Deem RN, MSN, BC-ADM, [facility] Inpatient Diabetes Coordinator Team Pager [phone] (8a-5p)

## 2017-11-09 NOTE — Evaluation (Signed)
Physical Therapy Evaluation Patient Details Name: Jerry Benson MRN: [id number] DOB: 04/25/1929 Today's Date: 11/09/2017   History of Present Illness  Jerry Benson is a 84 y.o. male with history of CAD status post CABG, hypertension, diabetes mellitus, chronic kidney disease was brought to the ER after patient has been having persistent recurrent falls and also has been found to have low blood sugar.  As per the family who provided the history patient has been having recurrent falls last 2 weeks and has not been hardly eating anything.  Denies any nausea vomiting diarrhea fever or chills.  Denies losing consciousness.  Patient uses a walker to walk and usually trips on it.  Clinical Impression  Pt admitted with above diagnosis. Pt currently with functional limitations due to the deficits listed below (see PT Problem List). Pt mod assist of 2 persons to transfer to chair.  Has had multiple falls at home.  Will benefit from SNF.   Pt will benefit from skilled PT to increase their independence and safety with mobility to allow discharge to the venue listed below.      Follow Up Recommendations SNF;Supervision/Assistance - 24 hour    Equipment Recommendations  Other (comment)(TBA)    Recommendations for Other Services       Precautions / Restrictions Precautions Precautions: Fall Restrictions Weight Bearing Restrictions: No      Mobility  Bed Mobility Overal bed mobility: Needs Assistance Bed Mobility: Supine to Sit     Supine to sit: Mod assist     General bed mobility comments: Assist for LEs and trunk  Transfers Overall transfer level: Needs assistance Equipment used: Rolling walker (2 wheeled) Transfers: Sit to/from [employer] Sit to Stand: Mod assist;+2 physical assistance Stand pivot transfers: Mod assist;+2 physical assistance       General transfer comment: unsteady and needed constant support for safety.  Attempted to sit too soon and needed incr  asssit to chair. Pt with flexed posture and could not stand straight.   Ambulation/Gait             General Gait Details: unable  Stairs            Wheelchair Mobility    Modified Rankin (Stroke Patients Only)       Balance Overall balance assessment: Needs assistance Sitting-balance support: No upper extremity supported;Feet supported Sitting balance-Leahy Scale: Fair     Standing balance support: Bilateral upper extremity supported;During functional activity Standing balance-Leahy Scale: Poor Standing balance comment: relies on UE support                             Pertinent Vitals/Pain Pain Assessment: No/denies pain  VSS  Home Living Family/patient expects to be discharged to:: Private residence Living Arrangements: Spouse/significant other Available Help at Discharge: Family;Available 24 hours/day Type of Home: House           Additional Comments: Pernurse, 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  Pt has had multiple falls.  Pt with dementia and could not answer questions above.     Prior Function Level of Independence: Needs assistance         Comments: unsure as pt unreliable historian     Hand Dominance        Extremity/Trunk Assessment   Upper Extremity Assessment Upper Extremity Assessment: Defer to OT evaluation    Lower Extremity Assessment Lower Extremity Assessment: Generalized weakness    Cervical / Trunk Assessment  Cervical / Trunk Assessment: Kyphotic  Communication   Communication: No difficulties  Cognition Arousal/Alertness: Awake/alert Behavior During Therapy: WFL for tasks assessed/performed Overall Cognitive Status: History of cognitive impairments - at baseline                                        General Comments      Exercises     Assessment/Plan    PT Assessment Patient needs continued PT services  PT Problem List Decreased strength;Decreased activity  tolerance;Decreased balance;Decreased mobility;Decreased knowledge of use of DME;Decreased knowledge of precautions;Cardiopulmonary status limiting activity       PT Treatment Interventions      PT Goals (Current goals can be found in the Care Plan section)  Acute Rehab PT Goals Patient Stated Goal: unable to state PT Goal Formulation: With patient Time For Goal Achievement: 11/23/17 Potential to Achieve Goals: Good    Frequency Min 2X/week   Barriers to discharge Decreased caregiver support      Co-evaluation               AM-PAC PT "6 Clicks" Daily Activity  Outcome Measure Difficulty turning over in bed (including adjusting bedclothes, sheets and blankets)?: Unable Difficulty moving from lying on back to sitting on the side of the bed? : Unable Difficulty sitting down on and standing up from a chair with arms (e.g., wheelchair, bedside commode, etc,.)?: Unable Help needed moving to and from a bed to chair (including a wheelchair)?: A Lot Help needed walking in hospital room?: Total Help needed climbing 3-5 steps with a railing? : Total 6 Click Score: 7    End of Session Equipment Utilized During Treatment: Gait belt Activity Tolerance: Patient limited by fatigue Patient left: in chair;with call bell/phone within reach;with chair alarm set Nurse Communication: Mobility status PT Visit Diagnosis: Unsteadiness on feet (R26.81);Muscle weakness (generalized) (M62.81)    Time: 1610-9604 PT Time Calculation (min) (ACUTE ONLY): 15 min   Charges:   PT Evaluation $PT Eval Moderate Complexity: 1 Mod     PT G Codes:        Kwaku Mostafa,PT Acute Rehabilitation [phone] (pager)   Berline Lopes 11/09/2017, 12:21 PM

## 2017-11-09 NOTE — Progress Notes (Signed)
PROGRESS NOTE    Jerry Benson  ZOX:[id number] DOB: 19-Nov-1928 DOA: 11/08/2017 PCP: Marden Noble, MD  Outpatient Specialists:   Brief Narrative: The patient is an 84 year old Caucasian male with past medical history significant for  CAD status post CABG, hypertension, diabetes mellitus, dementia and chronic kidney disease.  Patient was admitted with recurrent falls at home and hypoglycemia.  Appetite has been poor, with very decreased p.o. intake as per collateral information.  Patient uses a walker to walk and usually trips on it.  CT head was unremarkable.  X-ray on the left side showed 10th rib fracture.  Patient was started on D10.  No signs of infection.  Troponin is mildly elevated.  11/09/2017: Patient was seen alongside patient's wife, daughter and patient's nurse.  Patient has a home health nurse that sets aside patient's medication daily, and the wife administers the medication.  Patient's daughter has been encouraged to get involved in dispensing patient medications to ensure that arise are not made.  Patient is reported to be very weak.  The plan for 90s to have patient discharged to skilled nursing facility with rehabilitation.  Assessment & Plan:   Principal Problem:   Hypoglycemia Active Problems:   Type 2 diabetes mellitus with vascular disease (HCC)   Hx of CABG   CKD (chronic kidney disease) stage 3, GFR 30-59 ml/min (HCC)   Iron deficiency anemia   Memory disorder   Fall at home, initial encounter   Closed rib fracture   Pressure injury of skin   Hypoglycemia: -Llikely multifactorial. -Patient has very poor p.o. intake.  She also has chronic kidney disease.  Patient is on medication for diabetes mellitus. -Caloric count. -Continue to hold diabetic medications. -And short patient takes medication appropriately on discharge. -We will continue home health nursing on discharge to assist with medication.  Recurrent falls: -Follow recommendation by the physical therapy.   Likely skilled nursing facility with rehab.    Left 10th. And 11th rib fracture: - CAD status post CABG and stent placement:  -mildly elevated troponin, but stable..   -Patient on Plavix beta-blocker statins and Imdur.    Acute kidney injury on chronic kidney disease stage III:  -Slowly improving. -Continue IV fluids. -Avoid nephrotoxics.    Hypertension: Optimize.  Dementia:  on Aricept.  Normocytic normochromic anemia:  -follow CBC.  BPH:  on finasteride and tamsulosin.   DVT prophylaxis: Lovenox. Code Status: DNR.   Family Communication: Patient's wife and daughter. Disposition Plan:  Skilled nursing facility with rehab.   Consults called: Physical therapy and palliative care. Admission status: Observation.  Consultants:   None  Procedures:   None  Antimicrobials:   None  Subjective: Patient is a poor historian.  He has no significant history from the patient.  However, no documented fever.  And the patient does not look systemically ill.  Objective: Vitals:   11/09/17 0901 11/09/17 0952 11/09/17 1216 11/09/17 1625  BP: 136/60 (!) 142/64 (!) 116/54 (!) 124/52  Pulse: (!) 53 (!) 58 (!) 57 (!) 51  Resp: (!) 24   15  Temp: 97.8 F (36.6 C)   97.8 F (36.6 C)  TempSrc: Oral  Oral Oral  SpO2: 100%  100% 100%  Weight: 57.2 kg (126 lb 1.7 oz)     Height:  (1.702 m)       Intake/Output Summary (Last 24 hours) at 11/09/2017 1739 Last data filed at 11/09/2017 1500 Gross per 24 hour  Intake 1120 ml  Output 1175 ml  Net -55 ml   Filed Weights   11/08/17 1811 11/09/17 0901  Weight: 61.2 kg (135 lb) 57.2 kg (126 lb 1.7 oz)    Examination:  General exam: Appears calm and comfortable  Respiratory system: Clear to auscultation. Respiratory effort normal. Cardiovascular system: S1 & S2. No pedal edema. Gastrointestinal system: Abdomen is nondistended, soft and nontender. No organomegaly or masses felt. Normal bowel sounds heard. Central nervous  system: Wake and alert.  Patient moves all limbs.   Extremities: No leg edema.    Data Reviewed: I have personally reviewed following labs and imaging studies  CBC: Recent Labs  Lab 11/08/17 1837 11/09/17 0010 11/09/17 0358  WBC 6.7 4.7 4.9  NEUTROABS 5.8  --   --   HGB 12.0* 10.8* 10.6*  HCT 37.2* 33.3* 33.3*  MCV 89.2 89.0 88.8  PLT 134* 111* 124*   Basic Metabolic Panel: Recent Labs  Lab 11/08/17 1837 11/09/17 0010 11/09/17 0358  NA 136  --  137  K 4.2  --  3.6  CL 104  --  102  CO2 22  --  25  GLUCOSE 68  --  211*  BUN 29*  --  23*  CREATININE 1.39* 1.31* 1.27*  CALCIUM 8.9  --  8.5*   GFR: Estimated Creatinine Clearance: 33.8 mL/min (A) (by C-G formula based on SCr of 1.27 mg/dL (H)). Liver Function Tests: Recent Labs  Lab 11/08/17 1837  AST 25  ALT 15*  ALKPHOS 78  BILITOT 0.8  PROT 6.4*  ALBUMIN 3.8   No results for input(s): LIPASE, AMYLASE in the last 168 hours. Recent Labs  Lab 11/08/17 1837  AMMONIA 21   Coagulation Profile: No results for input(s): INR, PROTIME in the last 168 hours. Cardiac Enzymes: Recent Labs  Lab 11/08/17 1837 11/09/17 0010 11/09/17 0358 11/09/17 1007  TROPONINI 0.06* 0.05* 0.05* 0.04*   BNP (last 3 results) No results for input(s): PROBNP in the last 8760 hours. HbA1C: Recent Labs    11/09/17 0010  HGBA1C 6.8*   CBG: Recent Labs  Lab 11/09/17 0454 11/09/17 0628 11/09/17 0859 11/09/17 1104 11/09/17 1610  GLUCAP 242* 227* 201* 213* 344*   Lipid Profile: No results for input(s): CHOL, HDL, LDLCALC, TRIG, CHOLHDL, LDLDIRECT in the last 72 hours. Thyroid Function Tests: Recent Labs    11/09/17 0010  TSH 0.686   Anemia Panel: No results for input(s): VITAMINB12, FOLATE, FERRITIN, TIBC, IRON, RETICCTPCT in the last 72 hours. Urine analysis:    Component Value Date/Time   COLORURINE YELLOW 11/08/2017 1837   APPEARANCEUR CLEAR 11/08/2017 1837   LABSPEC 1.016 11/08/2017 1837   PHURINE 5.0  11/08/2017 1837   GLUCOSEU 50 (A) 11/08/2017 1837   HGBUR NEGATIVE 11/08/2017 1837   BILIRUBINUR NEGATIVE 11/08/2017 1837   KETONESUR NEGATIVE 11/08/2017 1837   PROTEINUR NEGATIVE 11/08/2017 1837   NITRITE NEGATIVE 11/08/2017 1837   LEUKOCYTESUR NEGATIVE 11/08/2017 1837   Sepsis Labs: (procalcitonin:4,lacticidven:4)  ) Recent Results (from the past 240 hour(s))  MRSA PCR Screening     Status: None   Collection Time: 11/09/17  8:55 AM  Result Value Ref Range Status   MRSA by PCR NEGATIVE NEGATIVE Final    Comment:        The GeneXpert MRSA Assay (FDA approved for NASAL specimens only), is one component of a comprehensive MRSA colonization surveillance program. It is not intended to diagnose MRSA infection nor to guide or monitor treatment for MRSA infections. Performed at [facility], [address] [place], [place] [address]          Radiology Studies: Dg Ribs Unilateral W/chest Left  Result Date: 11/08/2017 CLINICAL DATA:  Pain and bruising over the left ribs. EXAM: LEFT RIBS AND CHEST - 3+ VIEW COMPARISON:  CXR 09/18/2011 FINDINGS: Acute anterolateral angulated fracture of the left tenth rib an equivocal nondisplaced fracture of the posterior left third rib. Heart is enlarged but stable in appearance. Aortic atherosclerosis without aneurysm. Post CABG change is again noted. Lungs are clear without pulmonary consolidation. No effusion or pneumothorax. Cholecystectomy clips are present the right upper quadrant. Tubing projects over the periphery of the left thorax. IMPRESSION: Acute angulated left anterolateral tenth rib fracture with equivocal posterior eleventh rib fracture. No acute pulmonary abnormality. Stable cardiomegaly with aortic atherosclerosis and post CABG change. Electronically Signed   By: Tollie Eth M.D.   On: 11/08/2017 20:30   Dg Lumbar Spine 2-3 Views  Result Date: 11/09/2017 CLINICAL DATA:  84 year old male with fall. EXAM: LUMBAR SPINE  - 2-3 VIEW COMPARISON:  None. FINDINGS: There is no acute fracture or subluxation of the lumbar spine. There is osteopenia with multilevel degenerative changes. L4-L5 and L5-S1 disc space narrowing noted. There is mild chronic appearing L3 compression with mild anterior wedging. Multilevel facet hypertrophy. There is atherosclerotic calcification of the abdominal aorta. The soft tissues are grossly unremarkable. IMPRESSION: No acute/traumatic osseous pathology. Electronically Signed   By: Elgie Collard M.D.   On: 11/09/2017 01:56   Dg Pelvis 1-2 Views  Result Date: 11/09/2017 CLINICAL DATA:  84 y/o M; multiple falls over the past week. Pain down the left side of the body. EXAM: PELVIS - 1-2 VIEW COMPARISON:  None. FINDINGS: No acute fracture or pelvic diastases identified. Surgical clips project over the left groin. Advanced degenerative changes of visible lower lumbar spine. Mild osteoarthrosis of hip joints with periarticular osteophytosis. Extensive vascular calcification. IMPRESSION: No acute fracture or dislocation identified. Electronically Signed   By: Mitzi Hansen M.D.   On: 11/09/2017 01:55   Ct Head Wo Contrast  Result Date: 11/08/2017 CLINICAL DATA:  Hypoglycemia. Altered level of consciousness. Reported falls over the past week. EXAM: CT HEAD WITHOUT CONTRAST TECHNIQUE: Contiguous axial images were obtained from the base of the skull through the vertex without intravenous contrast. COMPARISON:  04/15/2016 MRI FINDINGS: BRAIN: There is mild sulcal and ventricular prominence consistent with superficial and central atrophy. No intraparenchymal hemorrhage, mass effect nor midline shift. Periventricular and subcortical white matter hypodensities consistent with chronic small vessel ischemic disease are identified. No acute large vascular territory infarcts. No abnormal extra-axial fluid collections. Basal cisterns are not effaced and midline. VASCULAR: Moderate calcific atherosclerosis of  the carotid siphons. SKULL: No skull fracture. No significant scalp soft tissue swelling. SINUSES/ORBITS: The mastoid air-cells are clear. The included paranasal sinuses are well-aerated.The included ocular globes and orbital contents are non-suspicious. Bilateral lens replacements noted. OTHER: None. IMPRESSION: Atrophy with chronic small vessel ischemia. No acute intracranial abnormality. Electronically Signed   By: Tollie Eth M.D.   On: 11/08/2017 21:14   Dg Chest Port 1 View  Result Date: 11/09/2017 CLINICAL DATA:  Generalized weakness and increased falls over the past week with bruising of the left ribs. The patient reports left-sided chest discomfort. EXAM: PORTABLE CHEST 1 VIEW COMPARISON:  Chest and left rib series of Nov 08, 2017 FINDINGS: The lungs are well-expanded. There is no pneumothorax or pleural effusion. There is no evidence of a pulmonary contusion. The heart is top-normal in size.  There are post CABG changes. There is no pulmonary vascular congestion. The bony thorax exhibits no acute abnormality. The known left tenth rib fracture is partially obscured on today's study by an overlying heart monitoring lead. IMPRESSION: Stable appearance of the chest. No pneumonia nor pulmonary edema. Mild cardiomegaly. A known anterior tenth rib fracture is faintly visible but partially obscured. Electronically Signed   By: David  [place] M.D.   On: 11/09/2017 07:44        Scheduled Meds: . benazepril  20 mg Oral Daily  . clopidogrel  75 mg Oral Daily  . donepezil  5 mg Oral QHS  . finasteride  5 mg Oral Daily  . heparin  5,000 Units Subcutaneous Q8H  . isosorbide mononitrate  30 mg Oral Daily  . metoprolol succinate  25 mg Oral Daily  . rosuvastatin  5 mg Oral q1800  . sertraline  50 mg Oral Daily  . tamsulosin  0.4 mg Oral Daily  . traZODone  50 mg Oral QHS   Continuous Infusions: . dextrose Stopped (11/09/17 0711)     LOS: 0 days    Time spent: 20 Minutes    Berton Mount,  MD  [facility] Pager #: [phone] 7PM-7AM contact night coverage as above

## 2017-11-09 NOTE — ED Notes (Signed)
Patient's spouse called to notify of move to [facility]

## 2017-11-09 NOTE — ED Notes (Signed)
Patient transported to X-ray 

## 2017-11-09 NOTE — Progress Notes (Signed)
Notified [facility] about how patients blood sugars are running in the upper 200's.

## 2017-11-10 DIAGNOSIS — E11649 Type 2 diabetes mellitus with hypoglycemia without coma: Secondary | ICD-10-CM | POA: Diagnosis not present

## 2017-11-10 DIAGNOSIS — E162 Hypoglycemia, unspecified: Secondary | ICD-10-CM | POA: Diagnosis not present

## 2017-11-10 LAB — GLUCOSE, CAPILLARY
GLUCOSE-CAPILLARY: 139 mg/dL — AB (ref 65–99)
GLUCOSE-CAPILLARY: 184 mg/dL — AB (ref 65–99)
GLUCOSE-CAPILLARY: 194 mg/dL — AB (ref 65–99)
Glucose-Capillary: 240 mg/dL — ABNORMAL HIGH (ref 65–99)
Glucose-Capillary: 124 mg/dL — ABNORMAL HIGH (ref 65–99)
Glucose-Capillary: 203 mg/dL — ABNORMAL HIGH (ref 65–99)
Glucose-Capillary: 105 mg/dL — ABNORMAL HIGH (ref 65–99)

## 2017-11-10 LAB — URINE CULTURE

## 2017-11-10 MED ORDER — INSULIN ASPART 100 UNIT/ML ~~LOC~~ SOLN: 0.0000 [IU] | Freq: Every day | SUBCUTANEOUS | 11 refills | Status: DC

## 2017-11-10 MED ORDER — MEGESTROL ACETATE 400 MG/10ML PO SUSP: 400.0000 mg | Freq: Every day | ORAL | 0 refills | Status: AC

## 2017-11-10 MED ORDER — LIDOCAINE 5 % EX PTCH: 1.0000 | MEDICATED_PATCH | CUTANEOUS | 0 refills | Status: AC

## 2017-11-10 MED ORDER — INSULIN ASPART 100 UNIT/ML ~~LOC~~ SOLN: 0.0000 [IU] | Freq: Three times a day (TID) | SUBCUTANEOUS | 11 refills | Status: DC

## 2017-11-10 NOTE — Clinical Social Work Note (Signed)
Clinical Social Worker continuing to follow patient and family for support and discharge planning needs.  Patient continuing to await insurance authorization.  Patient family has completed necessary admission paperwork at [facility] and hopeful for authorization by 5/9.  CSW has updated RN and patient family of delay in discharge due to insurance.  CSW remains available for support and to facilitate patient discharge needs once authorization is received.  Macario Golds, [place] [phone]

## 2017-11-10 NOTE — Progress Notes (Signed)
Inpatient Diabetes Program Recommendations  AACE/ADA: New Consensus Statement on Inpatient Glycemic Control (2015)  Target Ranges:  Prepandial:   less than 140 mg/dL      Peak postprandial:   less than 180 mg/dL (1-2 hours)      Critically ill patients:  140 - 180 mg/dL   Lab Results  Component Value Date   GLUCAP 203 (H) 11/10/2017   HGBA1C 6.8 (H) 11/09/2017    Review of Glycemic ControlResults for OLUFEMI, MOFIELD (MRN [id number]) as of 11/10/2017 12:26  Ref. Range 11/09/2017 22:36 11/10/2017 00:18 11/10/2017 05:45 11/10/2017 07:43 11/10/2017 12:21  Glucose-Capillary Latest Ref Range: 65 - 99 mg/dL 409 (H) 811 (H) 914 (H) 139 (H) 203 (H)    Diabetes history: Type 2 DM Outpatient Diabetes medications: Glipizide 5 mg Daily (was taking 10 mg on med rec), Metformin 500 mg tid with meals Inpatient Diabetes Program Recommendations:   Agree with d/ c of oral DM medications.  He likely will not need insulin as well at SNF while at nursing home.  Sent text page to MD.   Thanks,  Beryl Meager, RN, BC-ADM Inpatient Diabetes Coordinator Pager [phone] (8a-5p)

## 2017-11-10 NOTE — Progress Notes (Signed)
CSW met with patient's family at bedside and gave SNF offers. Family chooses [facility]. CSW left message for [facility] admissions. Patient will need [facility] authorization before admitting to the facility. CSW awaiting bed confirmation and will support with discharge.  Estanislado Emms, [place]

## 2017-11-10 NOTE — Discharge Summary (Signed)
Physician Discharge Summary  Patient ID: Jerry Benson MRN: [id number] DOB/AGE: May 12, 1929 84 y.o.  Admit date: 11/08/2017 Discharge date: 11/10/2017  Admission Diagnoses:  Discharge Diagnoses:  Principal Problem:   Hypoglycemia Active Problems:   Type 2 diabetes mellitus with vascular disease (HCC)   Hx of CABG   CKD (chronic kidney disease) stage 3, GFR 30-59 ml/min (HCC)   Iron deficiency anemia   Memory disorder   Fall at home, initial encounter   Closed rib fracture   Pressure injury of skin   Discharged Condition: stable  Hospital Course: The patient is an 84 year old Caucasian male with past medical history significant for  CAD status post CABG, hypertension, diabetes mellitus, dementia and chronic kidney disease.  Patient was admitted with recurrent falls at home and hypoglycemia.  Appetite has been poor, with very decreased p.o. intake as per collateral information.  Patient uses a walker to walk and usually trips on it.  CT head was unremarkable.  X-ray on the left side showed 10th rib fracture.  Patient was started on D10.  No signs of infection. Troponin was mildly elevated, but stable.  Patient was admitted to for further assessment and management.  Patient's blood sugar was monitored closely.  All diabetic medications were discontinued.  He was only on sliding scale insulin coverage.  Blood sugar has been stable.  Patient's appetite remains poor.  She will be discharged on Megace.  Please continue to monitor blood sugar closely, and adjust diabetic medications accordingly.  He was seen by physical therapy during the hospital stay, and his skilled nursing facility with rehab was advised.  Patient is stable for discharge.  Lidocaine patch was applied to the rib fracture area.  Pain secondary to rib fracture is well controlled.  Hypoglycemia:  -Hypoglycemia is multi factorial - Patient has very poor p.o. intake.  Patient also has chronic kidney disease.  Patient is on medication for  diabetes mellitus. -Continue calorie count.   -Continue to hold diabetic medications. -Continue to monitor blood sugar at least 4 times daily and use sliding scale insulin coverage for correction as if indicated.  Further management will be as per patient's primary care provider  Recurrent falls: -Patient will be discharged to skilled nursing facility with rehab.   Left 10th. And 11th rib fracture: Continue to use lidocaine patch.  Consider incentive spirometry if atelectasis is suspected.  -CAD status post CABG and stent placement: -mildly elevated troponin, but stable..  -Patient on Plavix, beta-blocker, statins and Imdur.   Acute kidney injury on chronic kidney disease stage III: -Slowly improving. -Continue to monitor -Avoid nephrotoxics.    Hypertension: Continue to optimize.  Dementia:  on Aricept.  Normocytic normochromic anemia: -follow CBC.  BPH:  on finasteride.  Consults: None  Significant Diagnostic Studies: CT scan of the head without contrast-"Atrophy with chronic small vessel ischemia. No acute intracranial Abnormality".  Lumbar x-ray - "No acute/traumatic osseous pathology".  X-ray ribs - "Acute angulated left anterolateral tenth rib fracture with equivocal posterior eleventh rib fracture. No acute pulmonary abnormality. Stable cardiomegaly with aortic atherosclerosis and post CABG Change".  Pelvic x-ray - "No acute fracture or dislocation identified".    Discharge Exam: Blood pressure (!) 159/77, pulse 65, temperature 98.3 F (36.8 C), temperature source Oral, resp. rate 20, height  (1.702 m), weight 56.7 kg (125 lb), SpO2 100 %.   Disposition: Skilled nursing facility with rehab  Discharge Instructions    Call MD for:   Complete by:  As directed  Please call MD if the symptoms worsen.   Diet - low sodium heart healthy   Complete by:  As directed    Diet Carb Modified   Complete by:  As directed    Discharge  instructions   Complete by:  As directed    Please monitor blood sugar and blood pressure very closely.   Increase activity slowly   Complete by:  As directed      Allergies as of 11/10/2017      Reactions   Amlodipine Other (See Comments)   headache   Lipitor [atorvastatin Calcium] Other (See Comments)   Unknown reaction   Lisinopril Cough   Rash / cough. However patient is currently taking Lotensin with no problems      Medication List    STOP taking these medications   benazepril 20 MG tablet Commonly known as:  LOTENSIN   benazepril 40 MG tablet Commonly known as:  LOTENSIN   glipiZIDE 10 MG tablet Commonly known as:  GLUCOTROL   GLIPIZIDE XL 5 MG 24 hr tablet Generic drug:  glipiZIDE   isosorbide dinitrate 20 MG tablet Commonly known as:  ISORDIL   isosorbide mononitrate 30 MG 24 hr tablet Commonly known as:  IMDUR   metFORMIN 500 MG tablet Commonly known as:  GLUCOPHAGE   metoprolol succinate 25 MG 24 hr tablet Commonly known as:  TOPROL-XL   metoprolol tartrate 50 MG tablet Commonly known as:  LOPRESSOR   tamsulosin 0.4 MG Caps capsule Commonly known as:  FLOMAX   traZODone 50 MG tablet Commonly known as:  DESYREL     TAKE these medications   clopidogrel 75 MG tablet Commonly known as:  PLAVIX Take 1 tablet (75 mg total) by mouth daily.   donepezil 5 MG tablet Commonly known as:  ARICEPT Take 1 tablet (5 mg total) by mouth at bedtime.   finasteride 5 MG tablet Commonly known as:  PROSCAR Take 5 mg by mouth daily.   insulin aspart 100 UNIT/ML injection Commonly known as:  novoLOG Inject 0-5 Units into the skin at bedtime.   insulin aspart 100 UNIT/ML injection Commonly known as:  novoLOG Inject 0-9 Units into the skin 3 (three) times daily with meals.   lidocaine 5 % Commonly known as:  LIDODERM Place 1 patch onto the skin daily. Remove & Discard patch within 12 hours or as directed by MD   megestrol 400 MG/10ML suspension Commonly  known as:  MEGACE Take 10 mLs (400 mg total) by mouth daily.   nitroGLYCERIN 0.4 MG SL tablet Commonly known as:  NITROSTAT Place one (1) tablet under the tongue every 5 minutes as needed for chest pain max 3 doses in 15 before calling 911.   rosuvastatin 10 MG tablet Commonly known as:  CRESTOR Take 0.5 tablets (5 mg total) by mouth daily.   sertraline 50 MG tablet Commonly known as:  ZOLOFT Take 50 mg by mouth daily.        SignedBarnetta Chapel 11/10/2017, 12:21 PM

## 2017-11-11 DIAGNOSIS — E11649 Type 2 diabetes mellitus with hypoglycemia without coma: Secondary | ICD-10-CM | POA: Diagnosis not present

## 2017-11-11 DIAGNOSIS — E162 Hypoglycemia, unspecified: Secondary | ICD-10-CM | POA: Diagnosis not present

## 2017-11-11 LAB — URINE CULTURE

## 2017-11-11 LAB — GLUCOSE, CAPILLARY
GLUCOSE-CAPILLARY: 150 mg/dL — AB (ref 65–99)
Glucose-Capillary: 151 mg/dL — ABNORMAL HIGH (ref 65–99)

## 2017-11-11 NOTE — Progress Notes (Signed)
Pt ready for discharge. Dr. Lowell Guitar notified transportation has been arranged. Report called to [employer], Charity fundraiser at [place].

## 2017-11-11 NOTE — Clinical Social Work Placement (Signed)
   CLINICAL SOCIAL WORK PLACEMENT  NOTE  Date:  11/11/2017  Patient Details  Name: Jerry Benson MRN: [id number] Date of Birth: [birth year]  Clinical Social Work is seeking post-discharge placement for this patient at the Skilled  Nursing Facility level of care (*CSW will initial, date and re-position this form in  chart as items are completed):  Yes   Patient/family provided with [facility] Clinical Social Work Department's list of facilities offering this level of care within the geographic area requested by the patient (or if unable, by the patient's family).  Yes   Patient/family informed of their freedom to choose among providers that offer the needed level of care, that participate in Medicare, Medicaid or managed care program needed by the patient, have an available bed and are willing to accept the patient.  Yes   Patient/family informed of [facility]'s ownership interest in [facility] and [facility], as well as of the fact that they are under no obligation to receive care at these facilities.  PASRR submitted to EDS on 11/09/17     PASRR number received on 11/09/17     Existing PASRR number confirmed on       FL2 transmitted to all facilities in geographic area requested by pt/family on 11/09/17     FL2 transmitted to all facilities within larger geographic area on       Patient informed that his/her managed care company has contracts with or will negotiate with certain facilities, including the following:  [facility]     Yes   Patient/family informed of bed offers received.  Patient chooses bed at [facility]     Physician recommends and patient chooses bed at      Patient to be transferred to [facility] on 11/11/17.  Patient to be transferred to facility by [facility]     Patient family notified on 11/11/17 of transfer.  Name of family member notified:  Steward Drone, daughter     PHYSICIAN       Additional Comment:     _______________________________________________ Abigail Butts, LCSW 11/11/2017, 11:55 AM

## 2017-11-11 NOTE — Progress Notes (Signed)
84 yo with CAD s/p CABG, HTN, DM, dementia, and CKD who presented with recurrent falls and hypoglycemia.  He was discharged yesterday, but waiting for SNF placement.  Pt seen and stable without complaints.  Awaiting discharge.  See previous discharge summary for details of hospitalization.

## 2017-11-11 NOTE — Progress Notes (Signed)
CSW notified by [facility] that they have received patient's [facility] authorization. Facility ready to admit patient today.  Patient will discharge to [facility] Anticipated discharge date: 11/11/17 Family notified: Steward Drone, daughter Transportation by: [facility]  Nurse to call report to [phone].   CSW signing off.  Abigail Butts, LCSWA  Clinical Social Worker

## 2017-11-11 NOTE — Progress Notes (Signed)
Physical Therapy Treatment Patient Details Name: Jerry Benson MRN: [id number] DOB: 04-Jun-1929 Today's Date: 11/11/2017    History of Present Illness Jerry Benson is a 84 y.o. male with history of CAD status post CABG, hypertension, diabetes mellitus, chronic kidney disease was brought to the ER after patient has been having persistent recurrent falls and also has been found to have low blood sugar.  As per the family who provided the history patient has been having recurrent falls last 2 weeks and has not been hardly eating anything.  Denies any nausea vomiting diarrhea fever or chills.  Denies losing consciousness.  Patient uses a walker to walk and usually trips on it.    PT Comments    Pt admitted with above diagnosis. Pt currently with functional limitations due to the deficits listed below (see PT Problem List). Pt was able to ambulate with RW with fair stability needing min assist with assist to move and steer RW especially with turns.  Pt will benefit from skilled PT to increase their independence and safety with mobility to allow discharge to the venue listed below.     Follow Up Recommendations  SNF;Supervision/Assistance - 24 hour     Equipment Recommendations  Other (comment)(TBA)    Recommendations for Other Services       Precautions / Restrictions Precautions Precautions: Fall Restrictions Weight Bearing Restrictions: No    Mobility  Bed Mobility Overal bed mobility: Needs Assistance Bed Mobility: Supine to Sit     Supine to sit: Mod assist     General bed mobility comments: Assist for LEs and trunk  Transfers Overall transfer level: Needs assistance Equipment used: Rolling walker (2 wheeled) Transfers: Sit to/from [employer] Sit to Stand: Mod assist;+2 physical assistance         General transfer comment: Needed assist to power up. More steady once on feet wtih RW.   Pt with flexed posture and can stand closer to upright with cues but  cannot maintain.   Ambulation/Gait Ambulation/Gait assistance: Min assist;+2 safety/equipment Ambulation Distance (Feet): 30 Feet Assistive device: Rolling walker (2 wheeled) Gait Pattern/deviations: Shuffle;Trunk flexed   Gait velocity interpretation: <1.31 ft/sec, indicative of household ambulator General Gait Details: Pt was able to ambulate with flexed posture but with some stability noted. Cannot withstand challenges to balance and does fatigue.     Stairs             Wheelchair Mobility    Modified Rankin (Stroke Patients Only)       Balance Overall balance assessment: Needs assistance Sitting-balance support: No upper extremity supported;Feet supported Sitting balance-Leahy Scale: Fair     Standing balance support: Bilateral upper extremity supported;During functional activity Standing balance-Leahy Scale: Poor Standing balance comment: relies on UE support                            Cognition Arousal/Alertness: Awake/alert Behavior During Therapy: WFL for tasks assessed/performed Overall Cognitive Status: History of cognitive impairments - at baseline                                        Exercises      General Comments        Pertinent Vitals/Pain Pain Assessment: No/denies pain  VSS  Home Living  Prior Function            PT Goals (current goals can now be found in the care plan section) Acute Rehab PT Goals Patient Stated Goal: unable to state Progress towards PT goals: Progressing toward goals    Frequency    Min 2X/week      PT Plan Current plan remains appropriate    Co-evaluation              AM-PAC PT "6 Clicks" Daily Activity  Outcome Measure  Difficulty turning over in bed (including adjusting bedclothes, sheets and blankets)?: Unable Difficulty moving from lying on back to sitting on the side of the bed? : Unable Difficulty sitting down on and standing  up from a chair with arms (e.g., wheelchair, bedside commode, etc,.)?: A Lot Help needed moving to and from a bed to chair (including a wheelchair)?: A Lot Help needed walking in hospital room?: A Lot Help needed climbing 3-5 steps with a railing? : Total 6 Click Score: 9    End of Session Equipment Utilized During Treatment: Gait belt Activity Tolerance: Patient limited by fatigue Patient left: in chair;with call bell/phone within reach;with chair alarm set Nurse Communication: Mobility status PT Visit Diagnosis: Unsteadiness on feet (R26.81);Muscle weakness (generalized) (M62.81)     Time: 0272-5366 PT Time Calculation (min) (ACUTE ONLY): 18 min  Charges:  $Gait Training: 8-22 mins                    G Codes:       Remo Kirschenmann,PT Acute Rehabilitation [phone] (pager)    Berline Lopes 11/11/2017, 1:24 PM

## 2017-11-12 ENCOUNTER — Encounter: Payer: Self-pay | Admitting: Adult Health

## 2017-11-12 ENCOUNTER — Non-Acute Institutional Stay (SKILLED_NURSING_FACILITY): Payer: Medicare Other | Admitting: Adult Health

## 2017-11-12 DIAGNOSIS — R630 Anorexia: Secondary | ICD-10-CM | POA: Diagnosis not present

## 2017-11-12 DIAGNOSIS — E162 Hypoglycemia, unspecified: Secondary | ICD-10-CM | POA: Diagnosis not present

## 2017-11-12 DIAGNOSIS — I10 Essential (primary) hypertension: Secondary | ICD-10-CM

## 2017-11-12 DIAGNOSIS — S2232XS Fracture of one rib, left side, sequela: Secondary | ICD-10-CM | POA: Diagnosis not present

## 2017-11-12 DIAGNOSIS — N183 Chronic kidney disease, stage 3 unspecified: Secondary | ICD-10-CM

## 2017-11-12 DIAGNOSIS — R296 Repeated falls: Secondary | ICD-10-CM | POA: Diagnosis not present

## 2017-11-12 DIAGNOSIS — I2510 Atherosclerotic heart disease of native coronary artery without angina pectoris: Secondary | ICD-10-CM

## 2017-11-12 DIAGNOSIS — E1159 Type 2 diabetes mellitus with other circulatory complications: Secondary | ICD-10-CM

## 2017-11-12 DIAGNOSIS — I2583 Coronary atherosclerosis due to lipid rich plaque: Secondary | ICD-10-CM

## 2017-11-12 DIAGNOSIS — F329 Major depressive disorder, single episode, unspecified: Secondary | ICD-10-CM | POA: Diagnosis not present

## 2017-11-12 DIAGNOSIS — F32A Depression, unspecified: Secondary | ICD-10-CM

## 2017-11-12 DIAGNOSIS — F0390 Unspecified dementia without behavioral disturbance: Secondary | ICD-10-CM | POA: Diagnosis not present

## 2017-11-12 DIAGNOSIS — N400 Enlarged prostate without lower urinary tract symptoms: Secondary | ICD-10-CM

## 2017-11-12 NOTE — Progress Notes (Signed)
Location:  [facility] Room Number: 115-A Place of Service:  SNF (31) Provider:  Kenard Gower, NP  Patient Care Team: Marden Noble, MD as PCP - General (Internal Medicine)  Extended Emergency Contact Information Primary Emergency Contact: Ander,Janie Address: [address]          [place], [place] Home Phone: [phone] Work Phone: [phone] Mobile Phone: [phone] Relation: Spouse Secondary Emergency Contact: Hunt,Michael Address: [address]          [place], [place] [address] [place] Home Phone: [phone] Relation: Grandson  Code Status:  DNR  Goals of care: Advanced Directive information Advanced Directives 11/12/2017  Does Patient Have a Medical Advance Directive? Yes  Type of Advance Directive Out of facility DNR (pink MOST or yellow form)  Does patient want to make changes to medical advance directive? No - Patient declined  Would patient like information on creating a medical advance directive? -  Pre-existing out of facility DNR order (yellow form or pink MOST form) -     Chief Complaint  Patient presents with  . Acute Visit    Hospital followup status post admission at [facility] 5/6-11/10/17 for frequent falls and hypoglycemia    HPI:  Pt is an 84 y.o. male seen today for hospital followup, status post admission at [facility] 5/6-11/10/17 due to frequent falls and hypoglycemia. He was reported to have poor oral intake at home. He uses a walker at home and usually trips on it. CT of head was unremarkable. X-ray on the left side showed 10th rib fracture. He was started on D10. Troponin was mildly elevated but stable. All diabetic medications were discontinued and was put on sliding scale insulin. He was discharged on Megace. He was admitted to [facility] for short-term rehabilitation.  He has a PMH of DM, HTN, CAD status post CABG, dementia, and chronic kidney disease. He  was seen in his room today with speech therapist at bedside who obtained a SLUMS score 8/30. He was seen drinking juice and eating a cookie. Noted to have left foot toenail with dry blood.   Past Medical History:  Diagnosis Date  . Chest pain   . CHF (congestive heart failure) (HCC)   . CKD (chronic kidney disease) stage 3, GFR 30-59 ml/min (HCC)   . Coronary artery disease    a. Remote MI 1981;  b. Prior CABG per Dr. Hendricks Milo in 1981;  c. redo CABG in 2009;  d. PCI to SVG to DX and OM in 2010;  e. PCI/DES VG-> OM (ISR) w/ 2.75x53mm Resolute DES Mar. 2013  . Diabetes mellitus   . GERD (gastroesophageal reflux disease)   . Hyperlipidemia   . Hypertension   . Status post insertion of non-drug eluting coronary artery stent 10/05/2008   Past Surgical History:  Procedure Laterality Date  . CARDIAC CATHETERIZATION  10/11/2008   PCI to SVG to diagonal and OM in 2010  . CORONARY ARTERY BYPASS GRAFT     original surgery in in 1981 with redo surgery in 2009. His original surgery was by Dr. Hendricks Milo and he had a SVG to the RCA, SVG to DX/OM, and SVG to DX/LAD; Redo surgery per Dr. Laneta Simmers in 2009 with LIMA to LAD and SVG to PD  . LEFT HEART CATHETERIZATION WITH CORONARY/GRAFT ANGIOGRAM N/A 09/18/2011   Procedure: LEFT HEART CATHETERIZATION WITH Isabel Caprice;  Surgeon: Herby Abraham, MD;  Location: [facility] CATH LAB;  Service:  Cardiovascular;  Laterality: N/A;  . PERCUTANEOUS CORONARY STENT INTERVENTION (PCI-S) N/A 09/21/2011   Procedure: PERCUTANEOUS CORONARY STENT INTERVENTION (PCI-S);  Surgeon: Herby Abraham, MD;  Location: [facility] CATH LAB;  Service: Cardiovascular;  Laterality: N/A;    Allergies  Allergen Reactions  . Amlodipine Other (See Comments)    headache  . Lipitor [Atorvastatin Calcium] Other (See Comments)    Unknown reaction  . Lisinopril Cough    Rash / cough. However patient is currently taking Lotensin with no problems    Outpatient Encounter Medications as of  11/12/2017  Medication Sig  . clopidogrel (PLAVIX) 75 MG tablet Take 1 tablet (75 mg total) by mouth daily.  Marland Kitchen donepezil (ARICEPT) 5 MG tablet Take 1 tablet (5 mg total) by mouth at bedtime.  . finasteride (PROSCAR) 5 MG tablet Take 5 mg by mouth daily.   . Insulin Aspart (NOVOLOG FLEXPEN [place]) Inject 1-9 Units into the skin 3 (three) times daily before meals. SSI:  121-150 = 1 unit, 151-200 = 2 units, 201-250 = 3 units, 251-300 = 5 units, 301-350 = 7 units, 351-400 = 9 units  . insulin aspart (NOVOLOG) 100 UNIT/ML injection Inject 1-5 Units into the skin at bedtime. SSI:  70-120 = units, 151-200 = 1 unit, 201-250 = 2 units, 251-300 = 3 units, 301-350 = 4 units, 351-400 = 5 units, greater than 400 call provider  . lidocaine (LIDODERM) 5 % Place 1 patch onto the skin daily. Remove & Discard patch within 12 hours or as directed by MD  . megestrol (MEGACE) 400 MG/10ML suspension Take 10 mLs (400 mg total) by mouth daily.  . nitroGLYCERIN (NITROSTAT) 0.4 MG SL tablet Place one (1) tablet under the tongue every 5 minutes as needed for chest pain max 3 doses in 15 before calling 911.  . rosuvastatin (CRESTOR) 10 MG tablet Take 0.5 tablets (5 mg total) by mouth daily.  . sertraline (ZOLOFT) 50 MG tablet Take 50 mg by mouth daily.    No facility-administered encounter medications on file as of 11/12/2017.     Review of Systems  GENERAL: No change in appetite, no fatigue, no weight changes, no fever, chills or weakness MOUTH and THROAT: Denies oral discomfort, gingival pain or bleeding, pain from teeth or hoarseness   RESPIRATORY: no cough, SOB, DOE, wheezing, hemoptysis CARDIAC: No chest pain, edema or palpitations GI: No abdominal pain, diarrhea, constipation, heart burn, nausea or vomiting GU: Denies dysuria, frequency, hematuria, incontinence, or discharge PSYCHIATRIC: Denies feelings of depression or anxiety. No report of hallucinations, insomnia, paranoia, or agitation   Pertinent  Health  Maintenance Due  Topic Date Due  . FOOT EXAM  12/04/1938  . OPHTHALMOLOGY EXAM  12/04/1938  . URINE MICROALBUMIN  12/04/1938  . PNA vac Low Risk Adult (1 of 2 - PCV13) 12/05/1995  . INFLUENZA VACCINE  02/03/2018  . HEMOGLOBIN A1C  05/12/2018      Vitals:   11/12/17 0821  BP: (!) 115/57  Pulse: (!) 56  Resp: 20  Temp: (!) 97 F (36.1 C)  TempSrc: Oral  SpO2: 99%  Weight: 119 lb 14.9 oz (54.4 kg)  Height:  (1.702 m)   Body mass index is 18.78 kg/m.  Physical Exam  GENERAL APPEARANCE: In no acute distress.  SKIN:  Left foot 3rd toenail has dry blood.  MOUTH and THROAT: Lips are without lesions. Oral mucosa is moist and without lesions. Tongue is normal in shape, size, and color and without lesions RESPIRATORY: Breathing is even &  unlabored, BS CTAB CARDIAC: RRR, no murmur,no extra heart sounds, no edema GI: Abdomen soft, normal BS, no masses, no tenderness EXTREMITIES:  Able to move X 4 extremities PSYCHIATRIC: Alert to self, disoriented to time and place.  Affect and behavior are appropriate  Labs reviewed: Recent Labs    11/08/17 1837 11/09/17 0010 11/09/17 0358  NA 136  --  137  K 4.2  --  3.6  CL 104  --  102  CO2 22  --  25  GLUCOSE 68  --  211*  BUN 29*  --  23*  CREATININE 1.39* 1.31* 1.27*  CALCIUM 8.9  --  8.5*   Recent Labs    11/08/17 1837  AST 25  ALT 15*  ALKPHOS 78  BILITOT 0.8  PROT 6.4*  ALBUMIN 3.8   Recent Labs    11/08/17 1837 11/09/17 0010 11/09/17 0358  WBC 6.7 4.7 4.9  NEUTROABS 5.8  --   --   HGB 12.0* 10.8* 10.6*  HCT 37.2* 33.3* 33.3*  MCV 89.2 89.0 88.8  PLT 134* 111* 124*   Lab Results  Component Value Date   TSH 0.686 11/09/2017   Lab Results  Component Value Date   HGBA1C 6.8 (H) 11/09/2017   Lab Results  Component Value Date   CHOL 129 04/15/2016   HDL 39 (L) 04/15/2016   LDLCALC 71 04/15/2016   TRIG 97 04/15/2016   CHOLHDL 3.3 04/15/2016    Significant Diagnostic Results in last 30 days:  Dg  Ribs Unilateral W/chest Left  Result Date: 11/08/2017 CLINICAL DATA:  Pain and bruising over the left ribs. EXAM: LEFT RIBS AND CHEST - 3+ VIEW COMPARISON:  CXR 09/18/2011 FINDINGS: Acute anterolateral angulated fracture of the left tenth rib an equivocal nondisplaced fracture of the posterior left third rib. Heart is enlarged but stable in appearance. Aortic atherosclerosis without aneurysm. Post CABG change is again noted. Lungs are clear without pulmonary consolidation. No effusion or pneumothorax. Cholecystectomy clips are present the right upper quadrant. Tubing projects over the periphery of the left thorax. IMPRESSION: Acute angulated left anterolateral tenth rib fracture with equivocal posterior eleventh rib fracture. No acute pulmonary abnormality. Stable cardiomegaly with aortic atherosclerosis and post CABG change. Electronically Signed   By: Tollie Eth M.D.   On: 11/08/2017 20:30   Dg Lumbar Spine 2-3 Views  Result Date: 11/09/2017 CLINICAL DATA:  84 year old male with fall. EXAM: LUMBAR SPINE - 2-3 VIEW COMPARISON:  None. FINDINGS: There is no acute fracture or subluxation of the lumbar spine. There is osteopenia with multilevel degenerative changes. L4-L5 and L5-S1 disc space narrowing noted. There is mild chronic appearing L3 compression with mild anterior wedging. Multilevel facet hypertrophy. There is atherosclerotic calcification of the abdominal aorta. The soft tissues are grossly unremarkable. IMPRESSION: No acute/traumatic osseous pathology. Electronically Signed   By: Elgie Collard M.D.   On: 11/09/2017 01:56   Dg Pelvis 1-2 Views  Result Date: 11/09/2017 CLINICAL DATA:  84 y/o M; multiple falls over the past week. Pain down the left side of the body. EXAM: PELVIS - 1-2 VIEW COMPARISON:  None. FINDINGS: No acute fracture or pelvic diastases identified. Surgical clips project over the left groin. Advanced degenerative changes of visible lower lumbar spine. Mild osteoarthrosis of hip  joints with periarticular osteophytosis. Extensive vascular calcification. IMPRESSION: No acute fracture or dislocation identified. Electronically Signed   By: Mitzi Hansen M.D.   On: 11/09/2017 01:55   Ct Head Wo Contrast  Result Date: 11/08/2017 CLINICAL  DATA:  Hypoglycemia. Altered level of consciousness. Reported falls over the past week. EXAM: CT HEAD WITHOUT CONTRAST TECHNIQUE: Contiguous axial images were obtained from the base of the skull through the vertex without intravenous contrast. COMPARISON:  04/15/2016 MRI FINDINGS: BRAIN: There is mild sulcal and ventricular prominence consistent with superficial and central atrophy. No intraparenchymal hemorrhage, mass effect nor midline shift. Periventricular and subcortical white matter hypodensities consistent with chronic small vessel ischemic disease are identified. No acute large vascular territory infarcts. No abnormal extra-axial fluid collections. Basal cisterns are not effaced and midline. VASCULAR: Moderate calcific atherosclerosis of the carotid siphons. SKULL: No skull fracture. No significant scalp soft tissue swelling. SINUSES/ORBITS: The mastoid air-cells are clear. The included paranasal sinuses are well-aerated.The included ocular globes and orbital contents are non-suspicious. Bilateral lens replacements noted. OTHER: None. IMPRESSION: Atrophy with chronic small vessel ischemia. No acute intracranial abnormality. Electronically Signed   By: Tollie Eth M.D.   On: 11/08/2017 21:14   Dg Chest Port 1 View  Result Date: 11/09/2017 CLINICAL DATA:  Generalized weakness and increased falls over the past week with bruising of the left ribs. The patient reports left-sided chest discomfort. EXAM: PORTABLE CHEST 1 VIEW COMPARISON:  Chest and left rib series of Nov 08, 2017 FINDINGS: The lungs are well-expanded. There is no pneumothorax or pleural effusion. There is no evidence of a pulmonary contusion. The heart is top-normal in size.  There are post CABG changes. There is no pulmonary vascular congestion. The bony thorax exhibits no acute abnormality. The known left tenth rib fracture is partially obscured on today's study by an overlying heart monitoring lead. IMPRESSION: Stable appearance of the chest. No pneumonia nor pulmonary edema. Mild cardiomegaly. A known anterior tenth rib fracture is faintly visible but partially obscured. Electronically Signed   By: David  [place] M.D.   On: 11/09/2017 07:44    Assessment/Plan  1. Hypoglycemia - probably due to poor oral intake, latest CBGs  259, 121, 141, will monitor CBGs  2. Recurrent falls - for rehabilitation, PT and OT for therapeutic strengthening exercises, fall precautions  3. Closed fracture of one rib of left side, sequela - x-ray showed 10th rib fracture, continue Lidocaine 5% patch daily   4. Poor appetite - continue Megestrol 400 mg/10 ml give 10 ml daily, start Eldertonic 15 ml BID, and Medpass NSA 120 ml BID   5. Type 2 diabetes mellitus with vascular disease (HCC) - continue Novolog 100 units/ml sliding scale 0-9 units SQ TIDac and 0-5 units Q HS Lab Results  Component Value Date   HGBA1C 6.8 (H) 11/09/2017     6. Essential hypertension - well-controlled, not on medications   7. Benign prostatic hyperplasia, unspecified whether lower urinary tract symptoms present - continue Finasteride 5 mg daily   8. Dementia without behavioral disturbance, unspecified dementia type - continue Donepezil 5 mg Q HS, supportive care and fall precautions   9. Coronary artery disease due to lipid rich plaque - history of CABG, continue Plavix 75 mg daily Rosuvastatin 5 mg daily, NTG PRN   10. CKD (chronic kidney disease) stage 3, GFR 30-59 ml/min (HCC) -  Lab Results  Component Value Date   CREATININE 1.27 (H) 11/09/2017     11. Chronic depression - continue Sertraline 50 mg daily, psych consult with [facility]    Family/ staff Communication: Discussed plan of  care with patient and charge nurse.  Labs/tests ordered:  BMP  Goals of care:   Short-term rehabilitation.   Bera Pinela  Medina-Vargas, NP [facility] [phone] (Monday-Friday 8:00 a.m. - 5:00 p.m.) [phone] (after hours)

## 2017-11-15 LAB — BASIC METABOLIC PANEL
BUN: 24 — AB (ref 4–21)
Creatinine: 1.2 (ref 0.6–1.3)
GLUCOSE: 153
Potassium: 4.1 (ref 3.4–5.3)
Sodium: 139 (ref 137–147)

## 2017-11-16 ENCOUNTER — Encounter: Payer: Self-pay | Admitting: Internal Medicine

## 2017-11-16 ENCOUNTER — Non-Acute Institutional Stay (SKILLED_NURSING_FACILITY): Payer: Medicare Other | Admitting: Internal Medicine

## 2017-11-16 DIAGNOSIS — I10 Essential (primary) hypertension: Secondary | ICD-10-CM

## 2017-11-16 DIAGNOSIS — N183 Chronic kidney disease, stage 3 unspecified: Secondary | ICD-10-CM

## 2017-11-16 DIAGNOSIS — E162 Hypoglycemia, unspecified: Secondary | ICD-10-CM

## 2017-11-16 NOTE — Assessment & Plan Note (Signed)
BP controlled; no change in antihypertensive medications  

## 2017-11-16 NOTE — Assessment & Plan Note (Addendum)
There is apparent discordance between glucoses & A1c in context of CKD. A1c goal of @ least 8% due to symptomatic hypoglycemia & co-morbidities

## 2017-11-16 NOTE — Progress Notes (Signed)
NURSING HOME LOCATION:  [facility] ROOM NUMBER:  115-A  CODE STATUS:  Full Code  PCP:  Marden Noble, MD  [address] [place] [address]   This is a comprehensive admission note to [facility] performed on this date less than 30 days from date of admission. Included are preadmission medical/surgical history;reconciled medication list; family history; social history and comprehensive review of systems.  Corrections and additions to the records were documented. Comprehensive physical exam was also performed. Additionally a clinical summary was entered for each active diagnosis pertinent to this admission in the Problem List to enhance continuity of care.  HPI: The patient was hospitalized 5/6-11/10/17, admitted with recurrent falls at home and hypoglycemia ( 68). This was in the context of anorexia with markedly decreased oral intake. The patient has also been experiencing mechanical falls as he trips employing his walker. CT revealed no acute intracranial process but 10th and 11th left rib fractures were documented. For hypoglycemia D10 was initiated and diabetic medications were discontinued. With this blood sugars were stable. Appetite remained poor and he was discharged on Megace. Lidocaine patch was applied to the area over the rib fractures with resultant good pain control. Rehabilitation at the SNF was recommended. Labs 11/09/17 revealed glucose of 211, creatinine 1.27, BMI 23, and GFR 49. Hemoglobin was 10.6 and hematocrit 33.3 with normochromic, normocytic indices. Platelet count was mildly reduced 124,000. A1c was 6.8% Here at the SNF glucoses are markedly variable with a low of 112 fasting and a high of 380 with evening meal. Past medical and surgical history: Includes chronic kidney disease, coronary artery disease status post CABG and stent placement, hypertension, dementia, BPH, chronic anemia, congestive heart failure, GERD and dyslipidemia.  Social  history: Nondrinker, never smoked  Family history: Reviewed, both parents had coronary disease.   Review of systems:  Reliability in question as he scored 8 out of 30 on the mental status testing. Despite this he was able to give me the date as 11/18/2017. He was able to give me the correct year he was married, 72. His only complaint is pain in the left rib cage with position change. He has rarely checked sugars at home. He was not on insulin. According to his grandson his diet consisted of cabbage, beans, and "taters". Constitutional: No fever,significant weight change, fatigue  Eyes: No redness, discharge, pain, vision change ENT/mouth: No nasal congestion, purulent discharge, earache, change in hearing, sore throat  Cardiovascular: No chest pain, palpitations, paroxysmal nocturnal dyspnea, claudication, edema  Respiratory: No cough, sputum production, hemoptysis, DOE, significant snoring, apnea Gastrointestinal: No heartburn, dysphagia, abdominal pain, nausea /vomiting, rectal bleeding, melena, change in bowels Genitourinary: No dysuria, hematuria, pyuria, incontinence, nocturia Musculoskeletal: No joint stiffness, joint swelling, weakness Dermatologic: No rash, pruritus, change in appearance of skin Neurologic: No dizziness, headache, syncope, seizures, numbness, tingling Psychiatric: No significant anxiety, depression, insomnia, anorexia Endocrine: No change in hair/skin/ nails, excessive thirst, excessive hunger, excessive urination  Hematologic/lymphatic: No significant bruising, lymphadenopathy, abnormal bleeding Allergy/immunology: No itchy/watery eyes, significant sneezing, urticaria, angioedema  Physical exam:  Pertinent or positive findings: He appears cachectic and poorly nourished. Pattern alopecia is present. His hair is disheveled, wispy,& white. Annia Friendly is unkempt. He has a thin mustache. Temporal wasting is noted. Eyebrows are thin, especially laterally. Has an upper plate  only. He has minor rales posteriorly. Pedal pulses are decreased. Interosseous wasting is present in the hands. He is surprisingly strong to opposition.  General appearance:  no  acute distress, increased work of breathing is present.   Lymphatic: No lymphadenopathy about the head, neck, axilla. Eyes: No conjunctival inflammation or lid edema is present. There is no scleral icterus. Ears:  External ear exam shows no significant lesions or deformities.   Nose:  External nasal examination shows no deformity or inflammation. Nasal mucosa are pink and moist without lesions, exudates Oral exam: Lips and gums are healthy appearing.There is no oropharyngeal erythema or exudate. Neck:  No thyromegaly, masses, tenderness noted.    Heart:  Normal rate and regular rhythm. S1 and S2 normal without gallop, murmur, click, rub .  Lungs: without wheezes, rhonchi, rubs despite fractures. Abdomen: Bowel sounds are normal.  Abdomen is soft and nontender with no organomegaly, hernias, masses. GU: Deferred  Extremities:  No cyanosis, clubbing, edema  Neurologic exam:  Strength equal  in upper & lower extremities Balance, Rhomberg, finger to nose testing could not be completed due to clinical state Deep tendon reflexes are equal Skin: Warm & dry w/o tenting. No significant lesions or rash.  See clinical summary under each active problem in the Problem List with associated updated therapeutic plan

## 2017-11-16 NOTE — Patient Instructions (Signed)
See assessment and plan under each diagnosis in the problem list and acutely for this visit 

## 2017-11-16 NOTE — Assessment & Plan Note (Addendum)
  The intensive sliding scale insulin schedule @ discharge is unrealistic here at the SNF This is especially true in the context of poor oral intake Discordance between the glucoses and the A1c are expected in the context of his CKD His A1c goal is at least 8

## 2017-11-18 ENCOUNTER — Inpatient Hospital Stay (HOSPITAL_COMMUNITY)
Admission: EM | Admit: 2017-11-18 | Discharge: 2017-11-23 | DRG: 281 | Disposition: A | Discharge status: Skilled Nursing Facility | Payer: Medicare Other | Attending: Internal Medicine | Admitting: Internal Medicine

## 2017-11-18 DIAGNOSIS — K219 Gastro-esophageal reflux disease without esophagitis: Secondary | ICD-10-CM | POA: Diagnosis present

## 2017-11-18 DIAGNOSIS — I2510 Atherosclerotic heart disease of native coronary artery without angina pectoris: Secondary | ICD-10-CM | POA: Diagnosis present

## 2017-11-18 DIAGNOSIS — N183 Chronic kidney disease, stage 3 unspecified: Secondary | ICD-10-CM | POA: Diagnosis present

## 2017-11-18 DIAGNOSIS — Z8249 Family history of ischemic heart disease and other diseases of the circulatory system: Secondary | ICD-10-CM

## 2017-11-18 DIAGNOSIS — Z888 Allergy status to other drugs, medicaments and biological substances status: Secondary | ICD-10-CM

## 2017-11-18 DIAGNOSIS — R739 Hyperglycemia, unspecified: Secondary | ICD-10-CM | POA: Diagnosis not present

## 2017-11-18 DIAGNOSIS — Z7902 Long term (current) use of antithrombotics/antiplatelets: Secondary | ICD-10-CM

## 2017-11-18 DIAGNOSIS — I214 Non-ST elevation (NSTEMI) myocardial infarction: Secondary | ICD-10-CM | POA: Diagnosis not present

## 2017-11-18 DIAGNOSIS — E785 Hyperlipidemia, unspecified: Secondary | ICD-10-CM | POA: Diagnosis present

## 2017-11-18 DIAGNOSIS — D62 Acute posthemorrhagic anemia: Secondary | ICD-10-CM | POA: Diagnosis not present

## 2017-11-18 DIAGNOSIS — F0390 Unspecified dementia without behavioral disturbance: Secondary | ICD-10-CM | POA: Diagnosis present

## 2017-11-18 DIAGNOSIS — Z955 Presence of coronary angioplasty implant and graft: Secondary | ICD-10-CM

## 2017-11-18 DIAGNOSIS — Z794 Long term (current) use of insulin: Secondary | ICD-10-CM

## 2017-11-18 DIAGNOSIS — I130 Hypertensive heart and chronic kidney disease with heart failure and stage 1 through stage 4 chronic kidney disease, or unspecified chronic kidney disease: Secondary | ICD-10-CM | POA: Diagnosis present

## 2017-11-18 DIAGNOSIS — R52 Pain, unspecified: Secondary | ICD-10-CM

## 2017-11-18 DIAGNOSIS — Z66 Do not resuscitate: Secondary | ICD-10-CM | POA: Diagnosis present

## 2017-11-18 DIAGNOSIS — Z79899 Other long term (current) drug therapy: Secondary | ICD-10-CM

## 2017-11-18 DIAGNOSIS — E1165 Type 2 diabetes mellitus with hyperglycemia: Secondary | ICD-10-CM | POA: Diagnosis present

## 2017-11-18 DIAGNOSIS — E1122 Type 2 diabetes mellitus with diabetic chronic kidney disease: Secondary | ICD-10-CM | POA: Diagnosis present

## 2017-11-18 DIAGNOSIS — Z951 Presence of aortocoronary bypass graft: Secondary | ICD-10-CM

## 2017-11-18 DIAGNOSIS — E1159 Type 2 diabetes mellitus with other circulatory complications: Secondary | ICD-10-CM | POA: Diagnosis present

## 2017-11-18 DIAGNOSIS — I509 Heart failure, unspecified: Secondary | ICD-10-CM | POA: Clinically undetermined

## 2017-11-18 DIAGNOSIS — M7981 Nontraumatic hematoma of soft tissue: Secondary | ICD-10-CM | POA: Diagnosis present

## 2017-11-19 ENCOUNTER — Emergency Department (HOSPITAL_COMMUNITY): Payer: Medicare Other

## 2017-11-19 ENCOUNTER — Inpatient Hospital Stay (HOSPITAL_COMMUNITY): Payer: Medicare Other

## 2017-11-19 ENCOUNTER — Encounter (HOSPITAL_COMMUNITY): Payer: Self-pay | Admitting: Emergency Medicine

## 2017-11-19 ENCOUNTER — Other Ambulatory Visit: Payer: Self-pay

## 2017-11-19 DIAGNOSIS — E1122 Type 2 diabetes mellitus with diabetic chronic kidney disease: Secondary | ICD-10-CM | POA: Diagnosis not present

## 2017-11-19 DIAGNOSIS — I214 Non-ST elevation (NSTEMI) myocardial infarction: Secondary | ICD-10-CM | POA: Diagnosis present

## 2017-11-19 DIAGNOSIS — Z888 Allergy status to other drugs, medicaments and biological substances status: Secondary | ICD-10-CM | POA: Diagnosis not present

## 2017-11-19 DIAGNOSIS — M7981 Nontraumatic hematoma of soft tissue: Secondary | ICD-10-CM | POA: Diagnosis not present

## 2017-11-19 DIAGNOSIS — Z955 Presence of coronary angioplasty implant and graft: Secondary | ICD-10-CM | POA: Diagnosis not present

## 2017-11-19 DIAGNOSIS — D62 Acute posthemorrhagic anemia: Secondary | ICD-10-CM | POA: Diagnosis not present

## 2017-11-19 DIAGNOSIS — N183 Chronic kidney disease, stage 3 (moderate): Secondary | ICD-10-CM | POA: Diagnosis not present

## 2017-11-19 DIAGNOSIS — F0390 Unspecified dementia without behavioral disturbance: Secondary | ICD-10-CM | POA: Diagnosis present

## 2017-11-19 DIAGNOSIS — Z951 Presence of aortocoronary bypass graft: Secondary | ICD-10-CM | POA: Diagnosis not present

## 2017-11-19 DIAGNOSIS — E785 Hyperlipidemia, unspecified: Secondary | ICD-10-CM | POA: Diagnosis not present

## 2017-11-19 DIAGNOSIS — Z79899 Other long term (current) drug therapy: Secondary | ICD-10-CM | POA: Diagnosis not present

## 2017-11-19 DIAGNOSIS — Z7902 Long term (current) use of antithrombotics/antiplatelets: Secondary | ICD-10-CM | POA: Diagnosis not present

## 2017-11-19 DIAGNOSIS — I509 Heart failure, unspecified: Secondary | ICD-10-CM | POA: Diagnosis not present

## 2017-11-19 DIAGNOSIS — Z66 Do not resuscitate: Secondary | ICD-10-CM | POA: Diagnosis not present

## 2017-11-19 DIAGNOSIS — E1159 Type 2 diabetes mellitus with other circulatory complications: Secondary | ICD-10-CM | POA: Diagnosis not present

## 2017-11-19 DIAGNOSIS — Z794 Long term (current) use of insulin: Secondary | ICD-10-CM | POA: Diagnosis not present

## 2017-11-19 DIAGNOSIS — R739 Hyperglycemia, unspecified: Secondary | ICD-10-CM | POA: Diagnosis present

## 2017-11-19 DIAGNOSIS — I2583 Coronary atherosclerosis due to lipid rich plaque: Secondary | ICD-10-CM | POA: Diagnosis not present

## 2017-11-19 DIAGNOSIS — I130 Hypertensive heart and chronic kidney disease with heart failure and stage 1 through stage 4 chronic kidney disease, or unspecified chronic kidney disease: Secondary | ICD-10-CM | POA: Diagnosis not present

## 2017-11-19 DIAGNOSIS — I2510 Atherosclerotic heart disease of native coronary artery without angina pectoris: Secondary | ICD-10-CM | POA: Diagnosis not present

## 2017-11-19 DIAGNOSIS — E1165 Type 2 diabetes mellitus with hyperglycemia: Secondary | ICD-10-CM | POA: Diagnosis not present

## 2017-11-19 DIAGNOSIS — K219 Gastro-esophageal reflux disease without esophagitis: Secondary | ICD-10-CM | POA: Diagnosis not present

## 2017-11-19 DIAGNOSIS — Z8249 Family history of ischemic heart disease and other diseases of the circulatory system: Secondary | ICD-10-CM | POA: Diagnosis not present

## 2017-11-19 LAB — CBC
HEMATOCRIT: 33.3 % — AB (ref 39.0–52.0)
HEMOGLOBIN: 11 g/dL — AB (ref 13.0–17.0)
MCH: 29.4 pg (ref 26.0–34.0)
MCHC: 33 g/dL (ref 30.0–36.0)
MCV: 89 fL (ref 78.0–100.0)
Platelets: 141 10*3/uL — ABNORMAL LOW (ref 150–400)
RBC: 3.74 MIL/uL — AB (ref 4.22–5.81)
RDW: 13.1 % (ref 11.5–15.5)
WBC: 11.8 10*3/uL — ABNORMAL HIGH (ref 4.0–10.5)

## 2017-11-19 LAB — BASIC METABOLIC PANEL
Anion gap: 12 (ref 5–15)
Anion gap: 8 (ref 5–15)
BUN: 39 mg/dL — ABNORMAL HIGH (ref 6–20)
BUN: 30 mg/dL — AB (ref 6–20)
CO2: 21 mmol/L — ABNORMAL LOW (ref 22–32)
CO2: 22 mmol/L (ref 22–32)
CREATININE: 1.29 mg/dL — AB (ref 0.61–1.24)
Calcium: 9.1 mg/dL (ref 8.9–10.3)
Calcium: 9.1 mg/dL (ref 8.9–10.3)
Chloride: 103 mmol/L (ref 101–111)
Chloride: 108 mmol/L (ref 101–111)
Creatinine, Ser: 1.52 mg/dL — ABNORMAL HIGH (ref 0.61–1.24)
GFR calc Af Amer: 46 mL/min — ABNORMAL LOW (ref >60)
GFR calc Af Amer: 56 mL/min — ABNORMAL LOW (ref >60)
GFR calc non Af Amer: 40 mL/min — ABNORMAL LOW (ref >60)
GFR calc non Af Amer: 48 mL/min — ABNORMAL LOW (ref >60)
GLUCOSE: 454 mg/dL — AB (ref 65–99)
GLUCOSE: 174 mg/dL — AB (ref 65–99)
POTASSIUM: 4.3 mmol/L (ref 3.5–5.1)
Potassium: 4.1 mmol/L (ref 3.5–5.1)
SODIUM: 136 mmol/L (ref 135–145)
SODIUM: 138 mmol/L (ref 135–145)

## 2017-11-19 LAB — URINALYSIS, ROUTINE W REFLEX MICROSCOPIC
BACTERIA UA: NONE SEEN
Bilirubin Urine: NEGATIVE
Glucose, UA: >=500 mg/dL — AB
HGB URINE DIPSTICK: NEGATIVE
Ketones, ur: NEGATIVE mg/dL
Leukocytes, UA: NEGATIVE
Nitrite: NEGATIVE
PROTEIN: NEGATIVE mg/dL
Specific Gravity, Urine: 1.027 (ref 1.005–1.030)
pH: 5 (ref 5.0–8.0)

## 2017-11-19 LAB — CBG MONITORING, ED
GLUCOSE-CAPILLARY: 284 mg/dL — AB (ref 65–99)
GLUCOSE-CAPILLARY: 347 mg/dL — AB (ref 65–99)
GLUCOSE-CAPILLARY: 487 mg/dL — AB (ref 65–99)
GLUCOSE-CAPILLARY: 514 mg/dL — AB (ref 65–99)
Glucose-Capillary: 272 mg/dL — ABNORMAL HIGH (ref 65–99)
Glucose-Capillary: 278 mg/dL — ABNORMAL HIGH (ref 65–99)

## 2017-11-19 LAB — GLUCOSE, CAPILLARY
GLUCOSE-CAPILLARY: 291 mg/dL — AB (ref 65–99)
GLUCOSE-CAPILLARY: 249 mg/dL — AB (ref 65–99)
GLUCOSE-CAPILLARY: 157 mg/dL — AB (ref 65–99)
GLUCOSE-CAPILLARY: 103 mg/dL — AB (ref 65–99)
Glucose-Capillary: 190 mg/dL — ABNORMAL HIGH (ref 65–99)
Glucose-Capillary: 154 mg/dL — ABNORMAL HIGH (ref 65–99)
Glucose-Capillary: 148 mg/dL — ABNORMAL HIGH (ref 65–99)
Glucose-Capillary: 167 mg/dL — ABNORMAL HIGH (ref 65–99)
Glucose-Capillary: 207 mg/dL — ABNORMAL HIGH (ref 65–99)

## 2017-11-19 LAB — HEPARIN LEVEL (UNFRACTIONATED): HEPARIN UNFRACTIONATED: 0.28 [IU]/mL — AB (ref 0.30–0.70)

## 2017-11-19 LAB — PROTIME-INR
INR: 1.07
Prothrombin Time: 13.9 seconds (ref 11.4–15.2)

## 2017-11-19 LAB — TROPONIN I: TROPONIN I: 0.53 ng/mL — AB (ref <0.03)

## 2017-11-19 LAB — APTT: aPTT: 26 seconds (ref 24–36)

## 2017-11-19 MED ORDER — INSULIN REGULAR HUMAN 100 UNIT/ML IJ SOLN
INTRAMUSCULAR | Status: DC
  Administered 2017-11-19: 2.2 [IU]/h via INTRAVENOUS
  Filled 2017-11-19 (×2): qty 1

## 2017-11-19 MED ORDER — SERTRALINE HCL 50 MG PO TABS
50.0000 mg | ORAL_TABLET | Freq: Every day | ORAL | Status: DC
  Administered 2017-11-19 – 2017-11-23 (×5): 50 mg via ORAL
  Filled 2017-11-19 (×6): qty 1

## 2017-11-19 MED ORDER — CLOPIDOGREL BISULFATE 75 MG PO TABS
75.0000 mg | ORAL_TABLET | Freq: Every day | ORAL | Status: DC
  Administered 2017-11-19 – 2017-11-20 (×2): 75 mg via ORAL
  Filled 2017-11-19 (×3): qty 1

## 2017-11-19 MED ORDER — METOPROLOL TARTRATE 12.5 MG HALF TABLET
12.5000 mg | ORAL_TABLET | Freq: Two times a day (BID) | ORAL | Status: DC
  Administered 2017-11-19 – 2017-11-20 (×3): 12.5 mg via ORAL
  Filled 2017-11-19 (×3): qty 1

## 2017-11-19 MED ORDER — ASPIRIN EC 81 MG PO TBEC
81.0000 mg | DELAYED_RELEASE_TABLET | Freq: Every day | ORAL | Status: DC
  Administered 2017-11-20 – 2017-11-23 (×2): 81 mg via ORAL
  Filled 2017-11-19 (×3): qty 1

## 2017-11-19 MED ORDER — DEXTROSE 50 % IV SOLN: 25.0000 mL | INTRAVENOUS | Status: DC | PRN

## 2017-11-19 MED ORDER — HEPARIN BOLUS VIA INFUSION
3000.0000 [IU] | Freq: Once | INTRAVENOUS | Status: AC
  Administered 2017-11-19: 3000 [IU] via INTRAVENOUS
  Filled 2017-11-19: qty 3000

## 2017-11-19 MED ORDER — ASPIRIN 81 MG PO CHEW
324.0000 mg | CHEWABLE_TABLET | Freq: Once | ORAL | Status: AC
  Administered 2017-11-19: 324 mg via ORAL
  Filled 2017-11-19: qty 4

## 2017-11-19 MED ORDER — ROSUVASTATIN CALCIUM 10 MG PO TABS
5.0000 mg | ORAL_TABLET | Freq: Every day | ORAL | Status: DC
  Administered 2017-11-19 – 2017-11-23 (×5): 5 mg via ORAL
  Filled 2017-11-19 (×6): qty 1

## 2017-11-19 MED ORDER — DEXTROSE-NACL 5-0.45 % IV SOLN
INTRAVENOUS | Status: DC
  Administered 2017-11-19: 11:00:00 via INTRAVENOUS

## 2017-11-19 MED ORDER — HEPARIN (PORCINE) IN NACL 100-0.45 UNIT/ML-% IJ SOLN
750.0000 [IU]/h | INTRAMUSCULAR | Status: DC
  Administered 2017-11-19 (×2): 650 [IU]/h via INTRAVENOUS
  Filled 2017-11-19 (×2): qty 250

## 2017-11-19 MED ORDER — INSULIN ASPART 100 UNIT/ML ~~LOC~~ SOLN
0.0000 [IU] | Freq: Every day | SUBCUTANEOUS | Status: DC
  Administered 2017-11-19 – 2017-11-21 (×2): 2 [IU] via SUBCUTANEOUS

## 2017-11-19 MED ORDER — NITROGLYCERIN 0.4 MG SL SUBL: 0.4000 mg | SUBLINGUAL_TABLET | SUBLINGUAL | Status: DC | PRN

## 2017-11-19 MED ORDER — ACETAMINOPHEN 325 MG PO TABS
650.0000 mg | ORAL_TABLET | ORAL | Status: DC | PRN
  Administered 2017-11-20 – 2017-11-22 (×3): 650 mg via ORAL
  Filled 2017-11-19 (×3): qty 2

## 2017-11-19 MED ORDER — INSULIN REGULAR BOLUS VIA INFUSION
0.0000 [IU] | Freq: Three times a day (TID) | INTRAVENOUS | Status: DC
  Administered 2017-11-19: 7 [IU] via INTRAVENOUS
  Administered 2017-11-19: 5 [IU] via INTRAVENOUS
  Filled 2017-11-19: qty 10

## 2017-11-19 MED ORDER — DONEPEZIL HCL 5 MG PO TABS
5.0000 mg | ORAL_TABLET | Freq: Every day | ORAL | Status: DC
  Administered 2017-11-19 – 2017-11-22 (×4): 5 mg via ORAL
  Filled 2017-11-19 (×4): qty 1

## 2017-11-19 MED ORDER — SODIUM CHLORIDE 0.9 % IV SOLN
INTRAVENOUS | Status: DC
  Administered 2017-11-19 – 2017-11-21 (×4): via INTRAVENOUS

## 2017-11-19 MED ORDER — SODIUM CHLORIDE 0.9 % IV BOLUS
500.0000 mL | Freq: Once | INTRAVENOUS | Status: AC
  Administered 2017-11-19: 500 mL via INTRAVENOUS

## 2017-11-19 MED ORDER — INSULIN ASPART 100 UNIT/ML ~~LOC~~ SOLN
0.0000 [IU] | Freq: Three times a day (TID) | SUBCUTANEOUS | Status: DC
  Administered 2017-11-20 (×2): 2 [IU] via SUBCUTANEOUS
  Administered 2017-11-20 – 2017-11-21 (×2): 3 [IU] via SUBCUTANEOUS
  Administered 2017-11-21: 1 [IU] via SUBCUTANEOUS
  Administered 2017-11-21 – 2017-11-22 (×3): 3 [IU] via SUBCUTANEOUS
  Administered 2017-11-22: 1 [IU] via SUBCUTANEOUS
  Administered 2017-11-23: 5 [IU] via SUBCUTANEOUS
  Administered 2017-11-23: 2 [IU] via SUBCUTANEOUS

## 2017-11-19 MED ORDER — INSULIN GLARGINE 100 UNIT/ML ~~LOC~~ SOLN
5.0000 [IU] | Freq: Once | SUBCUTANEOUS | Status: AC
  Administered 2017-11-19: 5 [IU] via SUBCUTANEOUS
  Filled 2017-11-19: qty 0.05

## 2017-11-19 MED ORDER — ONDANSETRON HCL 4 MG/2ML IJ SOLN: 4.0000 mg | Freq: Four times a day (QID) | INTRAMUSCULAR | Status: DC | PRN

## 2017-11-19 MED ORDER — LINAGLIPTIN 5 MG PO TABS
5.0000 mg | ORAL_TABLET | Freq: Every day | ORAL | Status: DC
  Administered 2017-11-19 – 2017-11-23 (×5): 5 mg via ORAL
  Filled 2017-11-19 (×5): qty 1

## 2017-11-19 NOTE — Consult Note (Addendum)
Cardiology Consultation:   Patient ID: Jerry Benson; [id number]; 1929-02-10   Admit date: 11/18/2017 Date of Consult: 11/19/2017  Primary Care Provider: Josetta Huddle, MD Primary Cardiologist: Dr Irish Lack, 11/17/2016 Primary Electrophysiologist:  n/a   Patient Profile:   Jerry Benson is a 84 y.o. male with a hx of CABG 1981 & 2009, DES SVG-OM 2013, DM, HTN, HLD, S-CHF w/ EF 45% at cath 2010,  frequent falls, CKD III, dementia, who is being seen today for the evaluation of ?STEMI, abnl ECG and troponin at the request of Dr Alcario Drought.  History of Present Illness:   Jerry Benson was interviewed in his room.  No family is present.  He was admitted from 05/06-05/08 for hypoglycemia, poor po intake, 10th and 11th rib fx after a fall, trop peak  0.06. Diabetic rx held, rec ac/hs CBGs and SSI. Pt d/c to [facility].  He was admitted 05/17 am for CBG 575, weak and diaphoretic, s/p NS 500cc and on insulin gtt.  [name] is oriented to name only.  He is able to answer simple questions and cooperates with the exam.  He states he has not had chest pain.  He is not having chest pain now.  He denies shortness of breath.  He states he lives with his wife and daughter.  Otherwise, he is not able to tell me much and information was obtained from staff and from chart notes.   Past Medical History:  Diagnosis Date  . Chest pain   . CHF (congestive heart failure) ([place])   . CKD (chronic kidney disease) stage 3, GFR 30-59 ml/min (HCC)   . Coronary artery disease    a. Remote MI 1981;  b. Prior CABG per Dr. Linus Mako in 1981;  c. redo CABG in 2009;  d. PCI to SVG to DX and OM in 2010;  e. PCI/DES VG-> OM (ISR) w/ 2.75x76m Resolute DES Mar. 2013  . Diabetes mellitus   . GERD (gastroesophageal reflux disease)   . Hyperlipidemia   . Hypertension   . Status post insertion of non-drug eluting coronary artery stent 10/05/2008    Past Surgical History:  Procedure Laterality Date  . CARDIAC  CATHETERIZATION  10/11/2008   PCI to SVG to diagonal and OM in 2010  . CORONARY ARTERY BYPASS GRAFT     original surgery in in 1981 with redo surgery in 2009. His original surgery was by Dr. SLinus Makoand he had a SVG to the RCA, SVG to DX/OM, and SVG to DX/LAD; Redo surgery per Dr. BCyndia Bentin 2009 with LIMA to LAD and SVG to PD  . LEFT HEART CATHETERIZATION WITH CORONARY/GRAFT ANGIOGRAM N/A 09/18/2011   Procedure: LEFT HEART CATHETERIZATION WITH CBeatrix Fetters  Surgeon: THillary Bow MD;  [facility];  Service: Cardiovascular;  Laterality: N/A;  . PERCUTANEOUS CORONARY STENT INTERVENTION (PCI-S) N/A 09/21/2011   Procedure: PERCUTANEOUS CORONARY STENT INTERVENTION (PCI-S);  Surgeon: THillary Bow MD;  [facility];  Service: Cardiovascular;  Laterality: N/A;     Prior to Admission medications   Medication Sig Start Date End Date Taking? Authorizing Provider  clopidogrel (PLAVIX) 75 MG tablet Take 1 tablet (75 mg total) by mouth daily. 11/17/16  Yes VJettie Booze MD  donepezil (ARICEPT) 5 MG tablet Take 1 tablet (5 mg total) by mouth at bedtime. 04/16/16  Yes DRobbie Lis MD  finasteride (PROSCAR) 5 MG tablet Take 5 mg by mouth daily.  10/14/17  Yes [provider]  geriatric multivitamins-minerals (ELDERTONIC/GEVRABON) LIQD Take 15 mLs by mouth 2 (two) times daily.   Yes [provider]  insulin aspart (NOVOLOG) 100 UNIT/ML injection Inject 0-12 Units into the skin at bedtime. SSI: <200 = 0 unit, 201-300= 5 units, 251-300 = 3 units, 301-400 = 8 units, greater than 400 =12 units  call provider   Yes [provider]  lidocaine (LIDODERM) 5 % Place 1 patch onto the skin daily. Remove & Discard patch within 12 hours or as directed by MD 11/10/17  Yes Bonnell Public, MD  megestrol (MEGACE) 400 MG/10ML suspension Take 10 mLs (400 mg total) by mouth daily. 11/10/17 12/10/17 Yes Bonnell Public, MD  nitroGLYCERIN (NITROSTAT) 0.4  MG SL tablet Place one (1) tablet under the tongue every 5 minutes as needed for chest pain max 3 doses in 15 before calling 911. 05/25/17  Yes Jettie Booze, MD  NUTRITIONAL SUPPLEMENT LIQD Take 120 mLs by mouth 2 (two) times daily. Med[employer]   Yes [provider]  rosuvastatin (CRESTOR) 10 MG tablet Take 0.5 tablets (5 mg total) by mouth daily. 11/17/16  Yes Jettie Booze, MD  sertraline (ZOLOFT) 50 MG tablet Take 50 mg by mouth daily.  10/14/17  Yes [provider]    Inpatient Medications: Scheduled Meds: . [START ON 11/20/2017] aspirin EC  81 mg Oral Daily  . clopidogrel  75 mg Oral Q breakfast  . donepezil  5 mg Oral QHS  . insulin regular  0-10 Units Intravenous TID WC  . rosuvastatin  5 mg Oral Daily  . sertraline  50 mg Oral Daily   Continuous Infusions: . sodium chloride 75 mL/hr at 11/19/17 0658  . dextrose 5 % and 0.45% NaCl    . heparin 650 Units/hr (11/19/17 0701)  . insulin (NOVOLIN-R) infusion 2.2 Units/hr (11/19/17 0729)   PRN Meds: acetaminophen, dextrose, nitroGLYCERIN, ondansetron (ZOFRAN) IV  Allergies:    Allergies  Allergen Reactions  . Amlodipine Other (See Comments)    headache  . Lipitor [Atorvastatin Calcium] Other (See Comments)    Unknown reaction  . Lisinopril Cough    Rash / cough. However patient is currently taking Lotensin with no problems    Social History:   Social History   Socioeconomic History  . Marital status: Married    Spouse name: Not on file  . Number of children: Not on file  . Years of education: Not on file  . Highest education level: Not on file  Occupational History  . Occupation: Retired  Scientific laboratory technician  . Financial resource strain: Not on file  . Food insecurity:    Worry: Not on file    Inability: Not on file  . Transportation needs:    Medical: Not on file    Non-medical: Not on file  Tobacco Use  . Smoking status: Never Smoker  . Smokeless tobacco: Never Used  Substance and Sexual  Activity  . Alcohol use: No  . Drug use: No  . Sexual activity: Never  Lifestyle  . Physical activity:    Days per week: Not on file    Minutes per session: Not on file  . Stress: Not on file  Relationships  . Social connections:    Talks on phone: Not on file    Gets together: Not on file    Attends religious service: Not on file    Active member of club or organization: Not on file    Attends meetings of clubs or  organizations: Not on file    Relationship status: Not on file  . Intimate partner violence:    Fear of current or ex partner: Not on file    Emotionally abused: Not on file    Physically abused: Not on file    Forced sexual activity: Not on file  Other Topics Concern  . Not on file  Social History Narrative   Never smoker, nondrinker.      Family History:   Family History  Problem Relation Age of Onset  . Heart disease Father   . Heart attack Father   . Heart disease Mother   . Stroke Daughter        09/2008  . Heart attack Brother    Family Status:  Family Status  Relation Name Status  . Father  Deceased  . Mother  Deceased  . Daughter  (Not Specified)  . Brother  (Not Specified)  . MGM  Deceased  . MGF  Deceased  . PGM  Deceased  . PGF  Deceased    ROS:  Please see the history of present illness.  All other ROS reviewed and negative.     Physical Exam/Data:   Vitals:   11/20/2017 0002 11/20/17 0641  BP: 126/81 (!) 148/119  Pulse: 83 85  Resp: 16 20  Temp: 97.6 F (36.4 C) 98.1 F (36.7 C)  TempSrc: Oral Oral  SpO2: 97% 100%   No intake or output data in the 24 hours ending 2017/11/20 0815 There were no vitals filed for this visit. There is no height or weight on file to calculate BMI.  General:  Well nourished, frail elderly male, in no acute distress HEENT: normal for age Lymph: no adenopathy Neck: no JVD Endocrine:  No thryomegaly Vascular: No carotid bruits; 4/4 extremity pulses 2+, without bruits  Cardiac:  normal S1, S2; RRR;  no murmur  Lungs:  clear to auscultation bilaterally, no wheezing, rhonchi or rales  Abd: soft, nontender, no hepatomegaly  Ext: no edema Musculoskeletal:  No deformities, BUE and BLE strength normal and equal Skin: warm and dry  Neuro:  CNs 2-12 intact, no focal abnormalities noted, oriented x1, memory is poor Psych:  Normal affect, pleasant  EKG:  The EKG was personally reviewed and demonstrates:   Nov 21, 2022 00:55 am - ST HR 108, ST/T wave abnormalities V2-4 are new, inferolateral ST/T wave abnormalities are more pronounced than previous 11-21-2022 4:19 am no sig change) 11/21/22 05:24 am, ST/T wave changes are improved Nov 21, 2022 05:29 am, either SR w/ short PR or atrial tach w/ HR 96 QRS duration 127-128 ms, was 120 ms 05/06 Telemetry:  Telemetry was personally reviewed and demonstrates:  SR, PACs and PVCs  Relevant CV Studies:  ECHO: none in system, EF 45% at cath 2010  CATH:  PCI 09/21/2011 Procedural Details: The right groin was prepped, draped, and anesthetized with 1% lidocaine. Using the modified Seldinger technique, a 6 Fr sheath was introduced into the right femoral artery.  Weight-based bivalirudin was given for anticoagulation. Once a therapeutic ACT was achieved, a 6 French left bypass with sidehole guide catheter was inserted.  A BMW coronary guidewire was used to cross the lesion.  The lesion was predilated with a 2.25 mm balloon with high pressure.  The lesion was then stented with a 2.75  Resolute 63m  Stent to 16 atm.  Because of his tenuous status, we elected not to postdilate.  As the lesion was an ISR, no distal protection was placed.  .Marland Kitchen  Following PCI, there was 10% residual stenosis and TIMI-3 flow. Final angiography confirmed an excellent result. The patient tolerated the procedure well. There were no immediate procedural complications. The patient was transferred to the post catheterization recovery area for further monitoring.  I reviewed the films with his wife.  There was distal  staining, but this was noted on the prior cath, and related to a distal arm of the graft that was thrombosed.  There were no complications.   Lesion Data: Vessel: SVG to OM Percent stenosis (pre): 90% TIMI-flow (pre):  3 Stent:  2.75 mm Resolute DES Percent stenosis (post): 10% TIMI-flow (post): 3 Conclusions:  1.  Successful PCI of SVG in stent restenosis 2.  Progressive disease in other territories  Diagnostic 09/18/2011 Left mainstem: Heavily calcified, severe proximal disease, the 99% stenosis with timi 2 flow.  It supplies one septal with diminished flow.  Left anterior descending (LAD): total proximal occlusion  Left circumflex (LCx): total proximal occlusion  Right coronary artery (RCA): Not injected to reduce contrast.  Known total occlusion.  SVG to RCA:  Widely patent into the PDA.  The distal PDA is widely patent.  The retrograde PDA is severely diseased, and supplies a large PLA, and also a circumflex collateral.  It is not approachable from the standpoint of PCI, but is a potential source of angina.    SVG to CFX OM:  The SVG has been previously stented in the proximal body.  There is 90% mid stent restenosis.  There is a second lesion of 50-60% just distal to the stent.  The graft inserts to a smaller marginal branch and there is 50-70% beyond the insertion.  The retrograde portions supplies a larger marginal system, with the subbranches demonstrating moderate disease.  These subbranches cannot be approached.    The LIMA to the LAD is patent.  The distal LAD is small and segmentally plaqued.  The IMA takeoff from the subclavian is open.  The subclavian proximally has 40% smooth narrowing, not significant in terms of obstruction.  The vertebral is severely diseased at its proximal takeoff, being 90% and irregular.  (he is not symptomatic).    The arch study shows an innominate and left carotid arising from a common trunk.  No high grade proximal stenoses are noted.   The left subclavian comes off at an angle, and the entry site is noted by calcification  (for future reference).  There is some proximal narrowing as noted  (see above)  No LV was done to reduce contrast load.  Final Conclusions:   1.  High grade restenosis of SVG to OM DES stent 2.  Severe native disease 3.  Patent IMA to LAD 4.  Patent SVG to PDA with severe retrograde disease to the PLA  (not approachable) Recommendations:  1.  PCI on Monday with DES.  As ISR, likely no distal protection.    Laboratory Data:  Chemistry Recent Labs  Lab 11/15/17 11/19/17 0217  NA 139 136  K 4.1 4.3  CL  --  103  CO2  --  21*  GLUCOSE  --  454*  BUN 24* 39*  CREATININE 1.2 1.52*  CALCIUM  --  9.1  GFRNONAA  --  40*  GFRAA  --  46*  ANIONGAP  --  12    Lab Results  Component Value Date   ALT 15 (L) 11/08/2017   AST 25 11/08/2017   ALKPHOS 78 11/08/2017   BILITOT 0.8 11/08/2017   Hematology Recent  Labs  Lab 11/19/17 0217  WBC 11.8*  RBC 3.74*  HGB 11.0*  HCT 33.3*  MCV 89.0  MCH 29.4  MCHC 33.0  RDW 13.1  PLT 141*   Cardiac Enzymes Recent Labs  Lab 11/19/17 0217  TROPONINI 0.53*   No results for input(s): TROPIPOC in the last 168 hours.  BNPNo results for input(s): BNP, PROBNP in the last 168 hours.  DDimer No results for input(s): DDIMER in the last 168 hours. TSH:  Lab Results  Component Value Date   TSH 0.686 11/09/2017   Lipids: Lab Results  Component Value Date   CHOL 129 04/15/2016   HDL 39 (L) 04/15/2016   LDLCALC 71 04/15/2016   TRIG 97 04/15/2016   CHOLHDL 3.3 04/15/2016   HgbA1c: Lab Results  Component Value Date   HGBA1C 6.8 (H) 11/09/2017   Magnesium: No results found for: MG   Radiology/Studies:  Dg Chest Port 1 View  Result Date: 11/19/2017 CLINICAL DATA:  Acute onset of left flank bruising and generalized weakness. Hypoglycemia. EXAM: PORTABLE CHEST 1 VIEW COMPARISON:  Chest radiograph performed 11/09/2017 FINDINGS: The lungs are  well-aerated and clear. There is no evidence of focal opacification, pleural effusion or pneumothorax. The cardiomediastinal silhouette is normal in size. The patient is status post median sternotomy, with evidence of prior CABG. No acute osseous abnormalities are seen. IMPRESSION: No acute cardiopulmonary process seen. Electronically Signed   By: Garald Balding M.D.   On: 11/19/2017 06:07    Assessment and Plan:   Principal Problem: 1.  NSTEMI (non-ST elevated myocardial infarction) ([place]) - unclear if pt has acute vessel closure or global ischemia in the setting of AKI w/ Cr 1.52 and CBG 454 - ECG initially more concerning but has improved.  - pt is asymptomatic - for now, med rx w/ ASA 324 mg, heparin, correcting underlying med problems. - has not been on nitrates in the past, discuss adding them w/ MD - continue to trend troponin  - with his multiple co-morbidities, no plans for invasive eval for now. - per Dr Hassell Done last note: "Given his dementia, no invasive testing unless there were extenuating circumstances".  Otherwise, per IM Active Problems:   Type 2 diabetes mellitus with vascular disease ([place])   CAD (coronary artery disease)   CKD (chronic kidney disease) stage 3, GFR 30-59 ml/min (HCC)   Hyperglycemia   For questions or updates, please contact [facility] Please consult [email or web address] for contact info under Cardiology/STEMI.   SignedRosaria Ferries, PA-C  11/19/2017 8:15 AM

## 2017-11-19 NOTE — ED Provider Notes (Signed)
[facility]-EMERGENCY DEPT Provider Note   CSN: [id number] Arrival date & time: 11/18/17  2356     History   Chief Complaint Chief Complaint  Patient presents with  . Hyperglycemia   Level 5 caveat due to dementia HPI [name] is a 84 y.o. male.  The history is provided by the patient.  Hyperglycemia  Severity:  Moderate Onset quality:  Sudden Timing:  Constant Progression:  Unchanged Chronicity:  New Relieved by:  Nothing Ineffective treatments:  None tried Patient presents from nursing facility for hyperglycemia. Per EMS report, patient had elevated glucose and EMS was called.  On their initial assessment patient appeared pale and sweaty and cold to touch.  He was given fluid bolus with improvement.  Patient does have bruising on left flank of unclear etiology.  Patient denies any complaints at this time.  He reports that the bruising on his left flank is not new.  He reports it is mildly sore this time but is improving  Past Medical History:  Diagnosis Date  . Chest pain   . CHF (congestive heart failure) (HCC)   . CKD (chronic kidney disease) stage 3, GFR 30-59 ml/min (HCC)   . Coronary artery disease    a. Remote MI 1981;  b. Prior CABG per Dr. Hendricks Milo in 1981;  c. redo CABG in 2009;  d. PCI to SVG to DX and OM in 2010;  e. PCI/DES VG-> OM (ISR) w/ 2.75x47mm Resolute DES Mar. 2013  . Diabetes mellitus   . GERD (gastroesophageal reflux disease)   . Hyperlipidemia   . Hypertension   . Status post insertion of non-drug eluting coronary artery stent 10/05/2008    Patient Active Problem List   Diagnosis Date Noted  . Pressure injury of skin 11/09/2017  . Hypoglycemia 11/08/2017  . Fall at home, initial encounter 11/08/2017  . Closed rib fracture 11/08/2017  . Word finding difficulty 04/14/2016  . Acute encephalopathy 04/14/2016  . Expressive aphasia 04/14/2016  . Hypertension 04/14/2016  . TIA (transient ischemic attack)  07/11/2015  . Memory disorder 05/28/2014  . Postherpetic neuralgia 09/25/2013  . Intertriginous candidiasis 01/12/2013  . Iron deficiency anemia 09/15/2012  . Malaise and fatigue 11/16/2011  . Unstable angina pectoris (HCC) 09/19/2011  . CKD (chronic kidney disease) stage 3, GFR 30-59 ml/min (HCC) 09/19/2011  . CAD (coronary artery disease) 01/21/2011  . Type 2 diabetes mellitus with vascular disease (HCC) 10/17/2010  . Hypercholesterolemia 10/17/2010  . Hx of CABG 10/17/2010  . Benign hypertensive heart disease without heart failure 10/17/2010  . Dyspepsia 10/17/2010    Past Surgical History:  Procedure Laterality Date  . CARDIAC CATHETERIZATION  10/11/2008   PCI to SVG to diagonal and OM in 2010  . CORONARY ARTERY BYPASS GRAFT     original surgery in in 1981 with redo surgery in 2009. His original surgery was by Dr. Hendricks Milo and he had a SVG to the RCA, SVG to DX/OM, and SVG to DX/LAD; Redo surgery per Dr. Laneta Simmers in 2009 with LIMA to LAD and SVG to PD  . LEFT HEART CATHETERIZATION WITH CORONARY/GRAFT ANGIOGRAM N/A 09/18/2011   Procedure: LEFT HEART CATHETERIZATION WITH Isabel Caprice;  Surgeon: Herby Abraham, MD;  Location[facility] C[facility] LAB;  Service: Cardiovascular;  Laterality: N/A;  . PERCUTANEOUS CORONARY STENT INTERVENTION (PCI-S) N/A 09/21/2011   Procedure: PERCUTANEOUS CORONARY STENT INTERVENTION (PCI-S);  Surgeon: Herby Abraham, MD;  Location: [facility] CATH LAB;  Service: Cardiovascular;  Laterality: N/A;  Home Medications    Prior to Admission medications   Medication Sig Start Date End Date Taking? Authorizing Provider  clopidogrel (PLAVIX) 75 MG tablet Take 1 tablet (75 mg total) by mouth daily. 11/17/16   Corky Crafts, MD  donepezil (ARICEPT) 5 MG tablet Take 1 tablet (5 mg total) by mouth at bedtime. 04/16/16   Alison Murray, MD  finasteride (PROSCAR) 5 MG tablet Take 5 mg by mouth daily.  10/14/17   [provider]  geriatric  multivitamins-minerals (ELDERTONIC/GEVRABON) LIQD Take 15 mLs by mouth 2 (two) times daily.    [provider]  Insulin Aspart (NOVOLOG FLEXPEN [place]) Inject 1-9 Units into the skin 3 (three) times daily before meals. SSI:  121-150 = 1 unit, 151-200 = 2 units, 201-250 = 3 units, 251-300 = 5 units, 301-350 = 7 units, 351-400 = 9 units    [provider]  insulin aspart (NOVOLOG) 100 UNIT/ML injection Inject 1-5 Units into the skin at bedtime. SSI:  70-120 = 0 units, 151-200 = 1 unit, 201-250 = 2 units, 251-300 = 3 units, 301-350 = 4 units, 351-400 = 5 units, greater than 400 call provider    [provider]  lidocaine (LIDODERM) 5 % Place 1 patch onto the skin daily. Remove & Discard patch within 12 hours or as directed by MD 11/10/17   Barnetta Chapel, MD  megestrol (MEGACE) 400 MG/10ML suspension Take 10 mLs (400 mg total) by mouth daily. 11/10/17 12/10/17  Barnetta Chapel, MD  nitroGLYCERIN (NITROSTAT) 0.4 MG SL tablet Place one (1) tablet under the tongue every 5 minutes as needed for chest pain max 3 doses in 15 before calling 911. 05/25/17   Corky Crafts, MD  NUTRITIONAL SUPPLEMENT LIQD Take 120 mLs by mouth 2 (two) times daily. [employer]    [provider]  rosuvastatin (CRESTOR) 10 MG tablet Take 0.5 tablets (5 mg total) by mouth daily. 11/17/16   Corky Crafts, MD  sertraline (ZOLOFT) 50 MG tablet Take 50 mg by mouth daily.  10/14/17   [provider]    Family History Family History  Problem Relation Age of Onset  . Heart disease Father   . Heart attack Father   . Heart disease Mother   . Stroke Daughter        09/2008  . Heart attack Brother     Social History Social History   Tobacco Use  . Smoking status: Never Smoker  . Smokeless tobacco: Never Used  Substance Use Topics  . Alcohol use: No  . Drug use: No     Allergies   Amlodipine; Lipitor [atorvastatin calcium]; and Lisinopril   Review of Systems Review of  Systems  Unable to perform ROS: Dementia     Physical Exam Updated Vital Signs BP 126/81 (BP Location: Right Arm)   Pulse 83   Temp 97.6 F (36.4 C) (Oral)   Resp 16   SpO2 97%   Physical Exam CONSTITUTIONAL: Elderly and frail, no acute distress HEAD: Normocephalic/atraumatic EYES: EOMI/PERRL ENMT: Mucous membranes dry NECK: supple no meningeal signs SPINE/BACK:entire spine nontender CV: S1/S2 noted, no murmurs/rubs/gallops noted Chest-no crepitus noted throughout chest wall, bruising noted to left flank LUNGS: Lungs are clear to auscultation bilaterally, no apparent distress ABDOMEN: soft, nontender, no rebound or guarding, bowel sounds noted throughout abdomen GU: Bruising noted to left flank, minimal tenderness noted NEURO: Pt is awake/alert/appropriate, moves all extremitiesx4.  Patient appears confused EXTREMITIES: pulses normal/equal, full ROM, no deformities  or tenderness noted SKIN: Pale but warm  ED Treatments / Results  Labs (all labs ordered are listed, but only abnormal results are displayed) Labs Reviewed  BASIC METABOLIC PANEL - Abnormal; Notable for the following components:      Result Value   CO2 21 (*)    Glucose, Bld 454 (*)    BUN 39 (*)    Creatinine, Ser 1.52 (*)    GFR calc non Af Amer 40 (*)    GFR calc Af Amer 46 (*)    All other components within normal limits  CBC - Abnormal; Notable for the following components:   WBC 11.8 (*)    RBC 3.74 (*)    Hemoglobin 11.0 (*)    HCT 33.3 (*)    Platelets 141 (*)    All other components within normal limits  URINALYSIS, ROUTINE W REFLEX MICROSCOPIC - Abnormal; Notable for the following components:   Glucose, UA >=500 (*)    All other components within normal limits  TROPONIN I - Abnormal; Notable for the following components:   Troponin I 0.53 (*)    All other components within normal limits  CBG MONITORING, ED - Abnormal; Notable for the following components:   Glucose-Capillary 487 (*)     All other components within normal limits  CBG MONITORING, ED - Abnormal; Notable for the following components:   Glucose-Capillary 514 (*)    All other components within normal limits  CBG MONITORING, ED - Abnormal; Notable for the following components:   Glucose-Capillary 347 (*)    All other components within normal limits  PROTIME-INR  APTT  CBG MONITORING, ED    EKG EKG Interpretation  Date/Time:  Friday Nov 19 2017 00:55:19 EDT Ventricular Rate:  108 PR Interval:    QRS Duration: 128 QT Interval:  373 QTC Calculation: 500 R Axis:   45 Text Interpretation:  Sinus tachycardia Nonspecific intraventricular conduction delay Probable lateral infarct, age indeterminate Repol abnrm, severe global ischemia (LM/MVD) Abnormal ekg Confirmed by Zadie Rhine ([id number]) on 11/19/2017 12:59:57 AM   EKG Interpretation  Date/Time:  Friday Nov 19 2017 04:19:01 EDT Ventricular Rate:  96 PR Interval:    QRS Duration: 104 QT Interval:  356 QTC Calculation: 450 R Axis:   19 Text Interpretation:  Sinus rhythm Repol abnrm, severe global ischemia (LM/MVD) Abnormal ekg Confirmed by Zadie Rhine ([id number]) on 11/19/2017 4:48:10 AM       CRITICAL CARE Performed by: Joya Gaskins Total critical care time: 40 minutes Critical care time was exclusive of separately billable procedures and treating other patients. Critical care was necessary to treat or prevent imminent or life-threatening deterioration. Critical care was time spent personally by me on the following activities: development of treatment plan with patient and/or surrogate as well as nursing, discussions with consultants, evaluation of patient's response to treatment, examination of patient, obtaining history from patient or surrogate, ordering and performing treatments and interventions, ordering and review of laboratory studies, ordering and review of radiographic studies, pulse oximetry and re-evaluation of patient's  condition.  Radiology No results found.  Procedures Procedures    Medications Ordered in ED Medications  aspirin chewable tablet 324 mg (has no administration in time range)  sodium chloride 0.9 % bolus 500 mL (0 mLs Intravenous Stopped 11/19/17 0439)     Initial Impression / Assessment and Plan / ED Course  I have reviewed the triage vital signs and the nursing notes.  Pertinent labs  results that were available during my  care of the patient were reviewed by me and considered in my medical decision making (see chart for details).     12:56 AM Patient here from nursing facility for hyperglycemia.  His initial reports that he was sweaty and cold to touch.  Right now is awake and alert and is in no distress Bruising on his left flank is not a new phenomenon.  There is no other signs of acute trauma I have attempted to call [facility] to get more information, but was unable to contact anyone Labs pending at this time 1:00 AM ekg is abnormal.  He has new ST depression mostly in V2 and V3.  Posterior MI is possible However, he is in no distress at this time he has no complaints. Will not activate cath lab at this time as no CP and no other complaints   Labs including a troponin are pending at this time. 4:51 AM Repeat EKG demonstrates continued ST depression.  His troponin is elevated.  However he does not mention any chest pain.  Discussed the case with Dr. Drinda Butts  with cardiology, she is reviewed EKG and his story  patient has a long history of CAD She recommends aspirin, heparin, medicine admission and cardiology will consult later this morning for ACS 5:25 AM Discussed with Dr. Julian Reil for admission to hospitalist. We will continue to treat as ACS, but since he has no acute chest pain at this time Cath Lab will not be activated  Heparin has been ordered.  Denies any h/o GI bleed Patient continues to rest comfortably, denies any chest or abdominal pain. He  has continued pain over the bruising from previous falls in his back 5:53 AM His posterior EKG does not reveal any ST elevation Patient continues to rest comfortably. Discussed case with on-call cardiology, will proceed with admission to hospitalist Final Clinical Impressions(s) / ED Diagnoses   Final diagnoses:  Non-STEMI (non-ST elevated myocardial infarction) [facility])  Hyperglycemia    ED Discharge Orders    None       Zadie Rhine, MD 11/19/17 [phone]

## 2017-11-19 NOTE — ED Notes (Addendum)
Attempted lab draw x 2, but unsuccessful. RN,Bobby made aware.

## 2017-11-19 NOTE — Clinical Social Work Note (Signed)
Clinical Social Work Assessment  Patient Details  Name: Jerry Benson MRN: [id number] Date of Birth: 21-Sep-1928  Date of referral:  11/19/17               Reason for consult:  Facility Placement, Discharge Planning                Permission sought to share information with:  Facility Sport and exercise psychologist, Family Supports Permission granted to share information::  Yes, Verbal Permission Granted  Name::     Salvator Seppala  Agency::  SNF  Relationship::  spouse  Contact Information:  [phone]  Housing/Transportation Living arrangements for the past 2 months:  Single Family Home, [place] of Information:  Spouse, Adult Children Patient Interpreter Needed:  None Criminal Activity/Legal Involvement Pertinent to Current Situation/Hospitalization:  No - Comment as needed Significant Relationships:  Adult Children, Spouse, Siblings Lives with:  Spouse Do you feel safe going back to the place where you live?  Yes Need for family participation in patient care:  Yes (Comment)  Care giving concerns: Patient readmitted from [facility] - discharged to the SNF a week ago. Initially from home with wife.   Social Worker assessment / plan: CSW met with patient, daughter Hassan Rowan, patient's wife, and patient's two sisters. Patient alert but disoriented. CSW discussed disposition planning. Family does not want patient to return to [place], but beyond that stated, "We hoped you'd be able to tell us where he should go."   CSW offered options - referrals to new SNFs or home with home health. Family wants to take him home but aware they are unable to provide the support he needs. Wife is also elderly and unable to lift patient. They also have an adult daughter with special needs that wife cares for.   CSW paged MD for PT orders. Patient will need new therapy evaluation to qualify for SNF again and obtain insurance authorization. Patient will require auth before admitting to  facility.  CSW to follow for PT recommendations and will support with discharge planning.  Employment status:  Retired Research officer, political party) PT Recommendations:  Not assessed at this time Information / Referral to community resources:  [place]  Patient/Family's Response to care: Family appreciative of care.  Patient/Family's Understanding of and Emotional Response to Diagnosis, Current Treatment, and Prognosis: Family with limited understanding of patient's condition and care needs, though agreeable to SNF.  Emotional Assessment Appearance:  Appears stated age Attitude/Demeanor/Rapport:  Engaged(talkative but very confused) Affect (typically observed):  Calm, Pleasant Orientation:  Oriented to Self Alcohol / Substance use:    Psych involvement (Current and /or in the community):  No (Comment)  Discharge Needs  Concerns to be addressed:  Discharge Planning Concerns, Care Coordination Readmission within the last 30 days:  Yes Current discharge risk:  Physical Impairment Barriers to Discharge:  Continued Medical Work up   Estanislado Emms, LCSW 11/19/2017, 3:06 PM

## 2017-11-19 NOTE — Progress Notes (Signed)
ANTICOAGULATION CONSULT NOTE Pharmacy Consult for IV heparin Indication: chest pain/ACS  Allergies  Allergen Reactions  . Amlodipine Other (See Comments)    headache  . Lipitor [Atorvastatin Calcium] Other (See Comments)    Unknown reaction  . Lisinopril Cough    Rash / cough. However patient is currently taking Lotensin with no problems    Patient Measurements: Height:  (170.2 cm) Weight: 121 lb 4.8 oz (55 kg) IBW/kg (Calculated) : 66.1 Heparin Dosing Weight: 52 kg (actual)  Vital Signs: Temp: 97.7 F (36.5 C) (05/17 1040) Temp Source: Oral (05/17 1040) BP: 123/70 (05/17 1400) Pulse Rate: 105 (05/17 1400)  Labs: Recent Labs    11/19/17 0217 11/19/17 1132 11/19/17 1702  HGB 11.0*  --   --   HCT 33.3*  --   --   PLT 141*  --   --   APTT 26  --   --   LABPROT 13.9  --   --   INR 1.07  --   --   HEPARINUNFRC  --   --  0.28*  CREATININE 1.52* 1.29*  --   TROPONINI 0.53*  --   --     Estimated Creatinine Clearance: 32 mL/min (A) (by C-G formula based on SCr of 1.29 mg/dL (H)).    Assessment: 56 yoM presents with hyperglycemia. In ED EKG demonstrates continued ST depression and elevated troponin. IV heparin for ACS.   Initial heparin level = 0.28  Goal of Therapy:  Heparin level 0.3-0.7 units/ml Monitor platelets by anticoagulation protocol: Yes   Plan:  Increase heparin to 750 units / hr Daily heparin level, CBC  Thank you Okey Regal, PharmD [phone] Elwin Sleight 11/19/2017,6:14 PM

## 2017-11-19 NOTE — ED Notes (Signed)
Pt. Made aware for need of urine specimen. 

## 2017-11-19 NOTE — Progress Notes (Signed)
84 year old male transferred from [facility] to be seen by cardiology.  Patient admitted with hyperglycemia with history of type 2 diabetes.  Recent admission for hypoglycemia patient was discharged on no insulin at that time.  Now he is being readmitted with hyperglycemia medical records and notes reviewed.  Cardiology recommends no further work-up at this time other than to continue heparin for 48 hours.  Patient is elderly and frail lives with his wife who is also frail with a mentally challenged daughter.  Will start Tradjenta while he is in the hospital.  Consult social work for placement.

## 2017-11-19 NOTE — Progress Notes (Signed)
ANTICOAGULATION CONSULT NOTE - Initial Consult  Pharmacy Consult for IV heparin Indication: chest pain/ACS  Allergies  Allergen Reactions  . Amlodipine Other (See Comments)    headache  . Lipitor [Atorvastatin Calcium] Other (See Comments)    Unknown reaction  . Lisinopril Cough    Rash / cough. However patient is currently taking Lotensin with no problems    Patient Measurements:   Heparin Dosing Weight: 52 kg (actual)  Vital Signs: Temp: 97.6 F (36.4 C) (05/17 0002) Temp Source: Oral (05/17 0002) BP: 126/81 (05/17 0002) Pulse Rate: 83 (05/17 0002)  Labs: Recent Labs    11/19/17 0217  HGB 11.0*  HCT 33.3*  PLT 141*  CREATININE 1.52*  TROPONINI 0.53*    Estimated Creatinine Clearance: 26 mL/min (A) (by C-G formula based on SCr of 1.52 mg/dL (H)).   Medical History: Past Medical History:  Diagnosis Date  . Chest pain   . CHF (congestive heart failure) (HCC)   . CKD (chronic kidney disease) stage 3, GFR 30-59 ml/min (HCC)   . Coronary artery disease    a. Remote MI 1981;  b. Prior CABG per Dr. Hendricks Milo in 1981;  c. redo CABG in 2009;  d. PCI to SVG to DX and OM in 2010;  e. PCI/DES VG-> OM (ISR) w/ 2.75x27mm Resolute DES Mar. 2013  . Diabetes mellitus   . GERD (gastroesophageal reflux disease)   . Hyperlipidemia   . Hypertension   . Status post insertion of non-drug eluting coronary artery stent 10/05/2008    Medications:  Scheduled:  . aspirin  324 mg Oral Once  . heparin  3,000 Units Intravenous Once   Infusions:  . heparin      Assessment: 69 yoM presents with hyperglycemia. In ED EKG demonstrates continued ST depression and elevated troponin. IV heparin for ACS.  Goal of Therapy:  Heparin level 0.3-0.7 units/ml Monitor platelets by anticoagulation protocol: Yes   Plan:  Heparin 3000 unit IV bolus x1 Start drip at 650 units/hr Daily CBC/HL Check 1st CBC/HL in 8 hours  Lorenza Evangelist 11/19/2017,5:45 AM

## 2017-11-19 NOTE — Progress Notes (Signed)
Completed insulin drip as ordered by Dr. Jerolyn Center.  Hinton Dyer, RN

## 2017-11-19 NOTE — ED Notes (Signed)
[facility] at the bedside °

## 2017-11-19 NOTE — Progress Notes (Signed)
Inpatient Diabetes Program Recommendations  AACE/ADA: New Consensus Statement on Inpatient Glycemic Control (2015)  Target Ranges:  Prepandial:   less than 140 mg/dL      Peak postprandial:   less than 180 mg/dL (1-2 hours)      Critically ill patients:  140 - 180 mg/dL   Lab Results  Component Value Date   GLUCAP 278 (H) 11/19/2017   HGBA1C 6.8 (H) 11/09/2017    Review of Glycemic Control Results for Jerry Benson, Jerry Benson (MRN [id number]) as of 11/19/2017 11:23  Ref. Range 11/19/2017 05:36 11/19/2017 07:08 11/19/2017 08:36 11/19/2017 09:17  Glucose-Capillary Latest Ref Range: 65 - 99 mg/dL 782 (H) 956 (H) 213 (H) 278 (H)   Diabetes history: Type 2 DM Outpatient Diabetes medications: none Current orders for Inpatient glycemic control: insulin drip  Inpatient Diabetes Program Recommendations:    Patient was discharged from facility on 11/09/17 and all DM meds were discontinued as patient was hypoglycemic on admission and having recurrent falls. A1C at the time was 6.8% indicating lows at home given age of patient and set recommendations for A1C from ADA.   Now to date, patient presents with hyperglycemia. When ready to transition off glucostabilizer, would recommend glycemic control order set: Novolog 0-9 units TID. Also, would recommend starting Tradgenta 5 mg QD while inpatient to help determine needs for outpatient at SNF, while still using an agent that would avoid hypoglycemia.  Thanks, Lujean Rave, MSN, RNC-OB Diabetes Coordinator [phone] (8a-5p)

## 2017-11-19 NOTE — ED Notes (Signed)
This RN spoke with patient's wife. Wife made aware of transfer.

## 2017-11-19 NOTE — ED Notes (Signed)
Pt. CBG 514, RN,Bobby made aware.

## 2017-11-19 NOTE — ED Notes (Signed)
Date and time results received: 11/19/17 "4:13 AM (use smartphrase ".now" to insert current time)  Test: Troponin Critical Value: 0.53  Name of Provider Notified: Wickline  Orders Received? Or Actions Taken?: MD aware. Will follow up accordingly

## 2017-11-19 NOTE — ED Notes (Signed)
EKG given to EDP,Wickline,MD., for review. 

## 2017-11-19 NOTE — ED Notes (Signed)
[facility] has been notified of need for transport.

## 2017-11-19 NOTE — H&P (Addendum)
History and Physical    Jerry Benson:[id number] DOB: [birth year] DOA: 11/18/2017  PCP: Marden Noble, MD  Patient coming from: SNF  I have personally briefly reviewed patient's old medical records in [facility]  Chief Complaint: Hyperglycemia  HPI: Jerry Benson is a 84 y.o. male with medical history significant of CAD, CKD stage 3, DM2, HLD.  Patient presents to the ED after being sent in from [facility] with hyperglycemia.  On arrival to ED patient was reportedly pale, sweaty, cool to touch with barely palpable radial pulse.   ED Course: Initial EKG is worrisome for pan ST depressions, possible posterior wall STEMI.  Initial trop 0.53.  Repeat EKG appears a bit better.  And by the time a posterior EKG is obtained, it appears he is NOT having a posterior wall STEMI.  Patient denies any CP, says he isnt SOB (though notes he was having SOB yesterday).  Does admit to L sided back pain, but states this is ongoing for a while since he broke a rib back there (not new today).  By the time I am seeing him, he is not diaphoretic, pale, etc.   Review of Systems: As per HPI otherwise 10 point review of systems negative.   Past Medical History:  Diagnosis Date  . Chest pain   . CHF (congestive heart failure) (HCC)   . CKD (chronic kidney disease) stage 3, GFR 30-59 ml/min (HCC)   . Coronary artery disease    a. Remote MI 1981;  b. Prior CABG per Dr. Hendricks Milo in 1981;  c. redo CABG in 2009;  d. PCI to SVG to DX and OM in 2010;  e. PCI/DES VG-> OM (ISR) w/ 2.75x60mm Resolute DES Mar. 2013  . Diabetes mellitus   . GERD (gastroesophageal reflux disease)   . Hyperlipidemia   . Hypertension   . Status post insertion of non-drug eluting coronary artery stent 10/05/2008    Past Surgical History:  Procedure Laterality Date  . CARDIAC CATHETERIZATION  10/11/2008   PCI to SVG to diagonal and OM in 2010  . CORONARY ARTERY BYPASS GRAFT     original surgery in in 1981 with redo  surgery in 2009. His original surgery was by Dr. Hendricks Milo and he had a SVG to the RCA, SVG to DX/OM, and SVG to DX/LAD; Redo surgery per Dr. Laneta Simmers in 2009 with LIMA to LAD and SVG to PD  . LEFT HEART CATHETERIZATION WITH CORONARY/GRAFT ANGIOGRAM N/A 09/18/2011   Procedure: LEFT HEART CATHETERIZATION WITH Isabel Caprice;  Surgeon: Herby Abraham, MD;  Location: [facility] CATH LAB;  Service: Cardiovascular;  Laterality: N/A;  . PERCUTANEOUS CORONARY STENT INTERVENTION (PCI-S) N/A 09/21/2011   Procedure: PERCUTANEOUS CORONARY STENT INTERVENTION (PCI-S);  Surgeon: Herby Abraham, MD;  Location: [facility] CATH LAB;  Service: Cardiovascular;  Laterality: N/A;     reports that he has never smoked. He has never used smokeless tobacco. He reports that he does not drink alcohol or use drugs.  Allergies  Allergen Reactions  . Amlodipine Other (See Comments)    headache  . Lipitor [Atorvastatin Calcium] Other (See Comments)    Unknown reaction  . Lisinopril Cough    Rash / cough. However patient is currently taking Lotensin with no problems    Family History  Problem Relation Age of Onset  . Heart disease Father   . Heart attack Father   . Heart disease Mother   . Stroke Daughter  09/2008  . Heart attack Brother      Prior to Admission medications   Medication Sig Start Date End Date Taking? Authorizing Provider  clopidogrel (PLAVIX) 75 MG tablet Take 1 tablet (75 mg total) by mouth daily. 11/17/16  Yes Corky Crafts, MD  donepezil (ARICEPT) 5 MG tablet Take 1 tablet (5 mg total) by mouth at bedtime. 04/16/16  Yes Alison Murray, MD  finasteride (PROSCAR) 5 MG tablet Take 5 mg by mouth daily.  10/14/17  Yes [provider]  geriatric multivitamins-minerals (ELDERTONIC/GEVRABON) LIQD Take 15 mLs by mouth 2 (two) times daily.   Yes [provider]  insulin aspart (NOVOLOG) 100 UNIT/ML injection Inject 0-12 Units into the skin at bedtime. SSI: <200 = 0 unit,  201-300= 5 units, 251-300 = 3 units, 301-400 = 8 units, greater than 400 =12 units  call provider   Yes [provider]  lidocaine (LIDODERM) 5 % Place 1 patch onto the skin daily. Remove & Discard patch within 12 hours or as directed by MD 11/10/17  Yes Barnetta Chapel, MD  megestrol (MEGACE) 400 MG/10ML suspension Take 10 mLs (400 mg total) by mouth daily. 11/10/17 12/10/17 Yes Barnetta Chapel, MD  nitroGLYCERIN (NITROSTAT) 0.4 MG SL tablet Place one (1) tablet under the tongue every 5 minutes as needed for chest pain max 3 doses in 15 before calling 911. 05/25/17  Yes Corky Crafts, MD  NUTRITIONAL SUPPLEMENT LIQD Take 120 mLs by mouth 2 (two) times daily. MedPass   Yes [provider]  rosuvastatin (CRESTOR) 10 MG tablet Take 0.5 tablets (5 mg total) by mouth daily. 11/17/16  Yes Corky Crafts, MD  sertraline (ZOLOFT) 50 MG tablet Take 50 mg by mouth daily.  10/14/17  Yes [provider]    Physical Exam: Vitals:   11/19/17 0002  BP: 126/81  Pulse: 83  Resp: 16  Temp: 97.6 F (36.4 C)  TempSrc: Oral  SpO2: 97%    Constitutional: NAD, calm, comfortable Eyes: PERRL, lids and conjunctivae normal ENMT: Mucous membranes are moist. Posterior pharynx clear of any exudate or lesions.Normal dentition.  Neck: normal, supple, no masses, no thyromegaly Respiratory: clear to auscultation bilaterally, no wheezing, no crackles. Normal respiratory effort. No accessory muscle use.  Cardiovascular: Regular rate and rhythm, no murmurs / rubs / gallops. No extremity edema. 2+ pedal pulses. No carotid bruits.  Abdomen: no tenderness, no masses palpated. No hepatosplenomegaly. Bowel sounds positive.  Musculoskeletal: no clubbing / cyanosis. No joint deformity upper and lower extremities. Good ROM, no contractures. Normal muscle tone.  Skin: no rashes, lesions, ulcers. No induration Neurologic: CN 2-12 grossly intact. Sensation intact, DTR normal. Strength 5/5 in  all 4.  Psychiatric: Normal judgment and insight. Alert and oriented x 3. Normal mood.    Labs on Admission: I have personally reviewed following labs and imaging studies  CBC: Recent Labs  Lab 11/19/17 0217  WBC 11.8*  HGB 11.0*  HCT 33.3*  MCV 89.0  PLT 141*   Basic Metabolic Panel: Recent Labs  Lab 11/15/17 11/19/17 0217  NA 139 136  K 4.1 4.3  CL  --  103  CO2  --  21*  GLUCOSE  --  454*  BUN 24* 39*  CREATININE 1.2 1.52*  CALCIUM  --  9.1   GFR: Estimated Creatinine Clearance: 26 mL/min (A) (by C-G formula based on SCr of 1.52 mg/dL (H)). Liver Function Tests: No results for input(s): AST, ALT, ALKPHOS, BILITOT, PROT, ALBUMIN  in the last 168 hours. No results for input(s): LIPASE, AMYLASE in the last 168 hours. No results for input(s): AMMONIA in the last 168 hours. Coagulation Profile: No results for input(s): INR, PROTIME in the last 168 hours. Cardiac Enzymes: Recent Labs  Lab 11/19/17 0217  TROPONINI 0.53*   BNP (last 3 results) No results for input(s): PROBNP in the last 8760 hours. HbA1C: No results for input(s): HGBA1C in the last 72 hours. CBG: Recent Labs  Lab 11/19/17 0021 11/19/17 0200 11/19/17 0536  GLUCAP 487* 514* 347*   Lipid Profile: No results for input(s): CHOL, HDL, LDLCALC, TRIG, CHOLHDL, LDLDIRECT in the last 72 hours. Thyroid Function Tests: No results for input(s): TSH, T4TOTAL, FREET4, T3FREE, THYROIDAB in the last 72 hours. Anemia Panel: No results for input(s): VITAMINB12, FOLATE, FERRITIN, TIBC, IRON, RETICCTPCT in the last 72 hours. Urine analysis:    Component Value Date/Time   COLORURINE YELLOW 11/19/2017 0209   APPEARANCEUR CLEAR 11/19/2017 0209   LABSPEC 1.027 11/19/2017 0209   PHURINE 5.0 11/19/2017 0209   GLUCOSEU >=500 (A) 11/19/2017 0209   HGBUR NEGATIVE 11/19/2017 0209   BILIRUBINUR NEGATIVE 11/19/2017 0209   KETONESUR NEGATIVE 11/19/2017 0209   PROTEINUR NEGATIVE 11/19/2017 0209   NITRITE NEGATIVE  11/19/2017 0209   LEUKOCYTESUR NEGATIVE 11/19/2017 0209    Radiological Exams on Admission: No results found.  EKG: Independently reviewed.  Assessment/Plan Principal Problem:   NSTEMI (non-ST elevated myocardial infarction) (HCC) Active Problems:   Type 2 diabetes mellitus with vascular disease (HCC)   CAD (coronary artery disease)   CKD (chronic kidney disease) stage 3, GFR 30-59 ml/min (HCC)   Hyperglycemia    1. NSTEMI - with dynamic T wave changes 1. PT actually CP free now, reports no SOB, and T waves look significantly improved since presentation. 2. ACS pathway 3. Discussed with cardiologist on call (who also discussed with cath lab cardiologist): 1. Posterior EKG doesn't appear to show posterior wall STEMI at this time 2. Heparin gtt 3. ASA 4. Admit to [facility] 5. Doesn't need emergency cath lab STEMI activation now. 6. She will inform AM cards consult team who will see patient 4. NPO 2. DM2 with hyperglycemia - 1. Insulin via gluco stabilizer 2. was taken off of all DM meds last admit for hypoglycemia and wasn't on anything at SNF. 3. CKD stage 3 - chronic and stable 4. CAD - 1. Extensive CAD history including CABG 1981, redo CABG 2009, last stent looks like it was 2013.  DVT prophylaxis: Heparin gtt Code Status: DNR - confirmed with daughter, also DNR prior admit, also has DNR bracelet at home Family Communication: Spoke with wife and daughter over phone Disposition Plan: SNF after admit Consults called: Spoke with Dr. Drinda Butts with cards Admission status: Admit to inpatient   Hillary Bow DO [facility] Pager [phone]  If 7AM-7PM, please contact day team taking care of patient [email or web address] Password [email or web address]  11/19/2017, 6:02 AM

## 2017-11-19 NOTE — ED Notes (Addendum)
Pt. CBG 487, RN,Bobby made aware.

## 2017-11-19 NOTE — ED Triage Notes (Signed)
Pt from [facility]  EMS called for hyperglycemia reported CBG was 575 EMS CBG -HIGH. On assessment pt was pale sweaty and cool to touch with bearly palpable radial pulse improved with 500 ml NSS bolus. Left flank bruising that pt reports injuried getting in chair medical record states pt has left side rib Fx and pt continues to c/o pain in that area. Pt reports generalized weakness  12 lead  Shows a bundle.

## 2017-11-19 NOTE — ED Notes (Signed)
EKG given to Hospitalist,Gardner,MD.

## 2017-11-20 LAB — CBC
HCT: 25.5 % — ABNORMAL LOW (ref 39.0–52.0)
Hemoglobin: 8.3 g/dL — ABNORMAL LOW (ref 13.0–17.0)
MCH: 28.9 pg (ref 26.0–34.0)
MCHC: 32.5 g/dL (ref 30.0–36.0)
MCV: 88.9 fL (ref 78.0–100.0)
PLATELETS: 137 10*3/uL — AB (ref 150–400)
RBC: 2.87 MIL/uL — AB (ref 4.22–5.81)
RDW: 13 % (ref 11.5–15.5)
WBC: 8.3 10*3/uL (ref 4.0–10.5)

## 2017-11-20 LAB — GLUCOSE, CAPILLARY
GLUCOSE-CAPILLARY: 216 mg/dL — AB (ref 65–99)
GLUCOSE-CAPILLARY: 164 mg/dL — AB (ref 65–99)
GLUCOSE-CAPILLARY: 193 mg/dL — AB (ref 65–99)
GLUCOSE-CAPILLARY: 168 mg/dL — AB (ref 65–99)
Glucose-Capillary: 246 mg/dL — ABNORMAL HIGH (ref 65–99)

## 2017-11-20 LAB — HEPARIN LEVEL (UNFRACTIONATED)
HEPARIN UNFRACTIONATED: 0.32 [IU]/mL (ref 0.30–0.70)
Heparin Unfractionated: 0.24 IU/mL — ABNORMAL LOW (ref 0.30–0.70)

## 2017-11-20 MED ORDER — SODIUM CHLORIDE 0.9 % IV BOLUS
500.0000 mL | Freq: Once | INTRAVENOUS | Status: AC
  Administered 2017-11-20: 500 mL via INTRAVENOUS

## 2017-11-20 MED ORDER — METOPROLOL TARTRATE 12.5 MG HALF TABLET
12.5000 mg | ORAL_TABLET | Freq: Every day | ORAL | Status: DC
  Administered 2017-11-21 – 2017-11-23 (×3): 12.5 mg via ORAL
  Filled 2017-11-20 (×3): qty 1

## 2017-11-20 NOTE — Progress Notes (Addendum)
ANTICOAGULATION CONSULT NOTE Pharmacy Consult for IV heparin Indication: chest pain/ACS  Allergies  Allergen Reactions  . Amlodipine Other (See Comments)    headache  . Lipitor [Atorvastatin Calcium] Other (See Comments)    Unknown reaction  . Lisinopril Cough    Rash / cough. However patient is currently taking Lotensin with no problems    Patient Measurements: Height:  (170.2 cm) Weight: 121 lb 4.8 oz (55 kg) IBW/kg (Calculated) : 66.1 Heparin Dosing Weight: 52 kg (actual)  Vital Signs: Temp: 97.6 F (36.4 C) (05/18 0800) Temp Source: Oral (05/18 0800) BP: 114/76 (05/18 0800) Pulse Rate: 77 (05/18 0800)  Labs: Recent Labs    11/19/17 0217 11/19/17 1132 11/19/17 1702 11/20/17 0434 11/20/17 0935  HGB 11.0*  --   --   --   --   HCT 33.3*  --   --   --   --   PLT 141*  --   --   --   --   APTT 26  --   --   --   --   LABPROT 13.9  --   --   --   --   INR 1.07  --   --   --   --   HEPARINUNFRC  --   --  0.28* 0.24* 0.32  CREATININE 1.52* 1.29*  --   --   --   TROPONINI 0.53*  --   --   --   --     Estimated Creatinine Clearance: 32 mL/min (A) (by C-G formula based on SCr of 1.29 mg/dL (H)).    Assessment: 88 yoM on IV heparin for ACS. Planning heparin for 48 hours per cardiology.  Heparin level is therapeutic on his current rate. No active bleeding noted. CBC was not checked this morning.  Goal of Therapy:  Heparin level 0.3-0.7 units/ml Monitor platelets by anticoagulation protocol: Yes   Plan:  Continue heparin at 750 units / hr Add CBC to this morning's labs Daily heparin level, CBC  Adren Dollins S 11/20/2017,11:21 AM   Addendum: Hgb decreased to 8.3. Paged Dr. Jerolyn Center - she discontinued heparin. Sallee Provencal  11/20/2017, 1:16 PM

## 2017-11-20 NOTE — Progress Notes (Addendum)
Entered pt's room to find pt trying to get oob, taking off leads, pt had d/c'd his IV and the bed was covered in blood. IV catheter found in bed intact and site was continuing to bleed. Pressure dressing applied to LAFA where IV was placed. Pt easily re-oriented to place and situation. Pt pale and seems to be oriented to person and place. CBG showed result of 216. BP is 70/52(56) upon entering room. Pt cleaned up, IV restarted and vitals rechecked. Pulse 70s per tele and BP again was low 78/49 with MAP of 59. Manualy bp on the Left Arm was 98/50 NP on call paged. Primary RN notified. And will follow up.

## 2017-11-20 NOTE — Progress Notes (Addendum)
PROGRESS NOTE    Jerry Benson  ZOX:[id number] DOB: 1929/05/22 DOA: 11/18/2017 PCP: Marden Noble, MD    Brief Narrative: 84 y.o. male with medical history significant of CAD, CKD stage 3, DM2, HLD.  Patient presents to the ED after being sent in from [facility] with hyperglycemia.  On arrival to ED patient was reportedly pale, sweaty, cool to touch with barely palpable radial pulse.   ED Course: Initial EKG is worrisome for pan ST depressions, possible posterior wall STEMI.  Initial trop 0.53.  Repeat EKG appears a bit better.  And by the time a posterior EKG is obtained, it appears he is NOT having a posterior wall STEMI.  Patient denies any CP, says he isnt SOB (though notes he was having SOB yesterday).  Does admit to L sided back pain, but states this is ongoing for a while since he broke a rib back there (not new today).  By the time I am seeing him, he is not diaphoretic, pale, etc.     Assessment & Plan:   Principal Problem:   NSTEMI (non-ST elevated myocardial infarction) (HCC) Active Problems:   Type 2 diabetes mellitus with vascular disease (HCC)   CAD (coronary artery disease)   CKD (chronic kidney disease) stage 3, GFR 30-59 ml/min (HCC)   Hyperglycemia  1]cad/cabg/nstemi-patient was admitted with frequent falls and uncontrolled.  Blood sugar.  Patient transferred here from vascular hospital to be seen by cardiology.  So far the plan per cardiology is not to do anything aggressive due to his age and dementia.  However they agree  with continuing heparin for 48 hours along with aspirin and may be adding a beta-blocker if his blood pressure allows.  Overnight his blood pressure has been low and he has been getting fluids.  Decrease beta-blocker to once a day.  DC heparin tomorrow 11/21/2017.  Follow-up CBC BMP tomorrow.HEPARIN DCD TODAY DUE TO DROP IN HB FROM 11 TO 8.no signs of active bleed.  2 type 2 diabetes with hyperglycemia-off insulin drip.  Started  Lantus 5 units nightly.  Blood sugar this morning is 164.  Patient was admitted here recently with hypoglycemia and then discharged home on no insulin.  Tradjenta 5 mg daily was started here during this hospital stay.  We will continue both and monitor blood sugar and adjust dose as needed.  3] dementia-patient lives at home with his elderly wife and mentally challenged daughter who is in her 35s.  Patient will need placement to the time of discharge.  Social worker has been consulted for placement.    DVT prophylaxis: Heparin Code Status: DO NOT RESUSCITATE Family Communication: No family available today.  But discussed with his wife his sister and his daughter yesterday. Disposition Plan: Plan to discharge him to skilled nursing facility when bed available.   Consultants: Cardiology Procedures none Antimicrobials none  Subjective: Resting in bed in no acute distress denies any chest pressure shortness of breath nausea or vomiting.  Objective: Vitals:   11/20/17 0038 11/20/17 0152 11/20/17 0723 11/20/17 0800  BP: (!) 73/47 (!) 80/60 (!) 86/60 114/76  Pulse: 73   77  Resp: 11   15  Temp: 98.5 F (36.9 C)   97.6 F (36.4 C)  TempSrc: Oral   Oral  SpO2: 100%   100%  Weight:      Height:        Intake/Output Summary (Last 24 hours) at 11/20/2017 1117 Last data filed at 11/20/2017 1000 Gross per 24 hour  Intake 1936.94 ml  Output 550 ml  Net 1386.94 ml   Filed Weights   11/19/17 1040  Weight: 55 kg (121 lb 4.8 oz)    Examination:  General exam: Appears calm and comfortable  Respiratory system: Clear to auscultation. Respiratory effort normal. Cardiovascular system: S1 & S2 heard, RRR. No JVD, murmurs, rubs, gallops or clicks. No pedal edema. Gastrointestinal system: Abdomen is nondistended, soft and nontender. No organomegaly or masses felt. Normal bowel sounds heard. Central nervous system: Alert and oriented. No focal neurological deficits. Extremities: Symmetric 5 x 5  power. Skin: No rashes, lesions or ulcers Psychiatry: Judgement and insight appear normal. Mood & affect appropriate.     Data Reviewed: I have personally reviewed following labs and imaging studies  CBC: Recent Labs  Lab 11/19/17 0217  WBC 11.8*  HGB 11.0*  HCT 33.3*  MCV 89.0  PLT 141*   Basic Metabolic Panel: Recent Labs  Lab 11/15/17 11/19/17 0217 11/19/17 1132  NA 139 136 138  K 4.1 4.3 4.1  CL  --  103 108  CO2  --  21* 22  GLUCOSE  --  454* 174*  BUN 24* 39* 30*  CREATININE 1.2 1.52* 1.29*  CALCIUM  --  9.1 9.1   GFR: Estimated Creatinine Clearance: 32 mL/min (A) (by C-G formula based on SCr of 1.29 mg/dL (H)). Liver Function Tests: No results for input(s): AST, ALT, ALKPHOS, BILITOT, PROT, ALBUMIN in the last 168 hours. No results for input(s): LIPASE, AMYLASE in the last 168 hours. No results for input(s): AMMONIA in the last 168 hours. Coagulation Profile: Recent Labs  Lab 11/19/17 0217  INR 1.07   Cardiac Enzymes: Recent Labs  Lab 11/19/17 0217  TROPONINI 0.53*   BNP (last 3 results) No results for input(s): PROBNP in the last 8760 hours. HbA1C: No results for input(s): HGBA1C in the last 72 hours. CBG: Recent Labs  Lab 11/19/17 1657 11/19/17 1803 11/19/17 2133 11/20/17 0151 11/20/17 0830  GLUCAP 157* 103* 207* 216* 164*   Lipid Profile: No results for input(s): CHOL, HDL, LDLCALC, TRIG, CHOLHDL, LDLDIRECT in the last 72 hours. Thyroid Function Tests: No results for input(s): TSH, T4TOTAL, FREET4, T3FREE, THYROIDAB in the last 72 hours. Anemia Panel: No results for input(s): VITAMINB12, FOLATE, FERRITIN, TIBC, IRON, RETICCTPCT in the last 72 hours. Sepsis Labs: No results for input(s): PROCALCITON, LATICACIDVEN in the last 168 hours.  No results found for this or any previous visit (from the past 240 hour(s)).       Radiology Studies: Dg Sacrum/coccyx  Result Date: 11/19/2017 CLINICAL DATA:  Acute sacral/coccygeal pain.  EXAM: SACRUM AND COCCYX - 2+ VIEW COMPARISON:  11/09/2017 radiographs FINDINGS: There is no evidence of fracture or other focal bone lesions. Degenerative changes in the LOWER lumbar spine again noted. IMPRESSION: No acute abnormality Degenerative changes in the LOWER lumbar spine. Electronically Signed   By: Harmon Pier M.D.   On: 11/19/2017 21:21   Dg Chest Port 1 View  Result Date: 11/19/2017 CLINICAL DATA:  Acute onset of left flank bruising and generalized weakness. Hypoglycemia. EXAM: PORTABLE CHEST 1 VIEW COMPARISON:  Chest radiograph performed 11/09/2017 FINDINGS: The lungs are well-aerated and clear. There is no evidence of focal opacification, pleural effusion or pneumothorax. The cardiomediastinal silhouette is normal in size. The patient is status post median sternotomy, with evidence of prior CABG. No acute osseous abnormalities are seen. IMPRESSION: No acute cardiopulmonary process seen. Electronically Signed   By: Roanna Raider M.D.   On:  11/19/2017 06:07        Scheduled Meds: . aspirin EC  81 mg Oral Daily  . clopidogrel  75 mg Oral Q breakfast  . donepezil  5 mg Oral QHS  . insulin aspart  0-5 Units Subcutaneous QHS  . insulin aspart  0-9 Units Subcutaneous TID WC  . linagliptin  5 mg Oral Daily  . metoprolol tartrate  12.5 mg Oral BID  . rosuvastatin  5 mg Oral Daily  . sertraline  50 mg Oral Daily   Continuous Infusions: . sodium chloride 75 mL/hr at 11/20/17 0700  . heparin 750 Units/hr (11/20/17 0700)     LOS: 1 day     Alwyn Ren, MD [facility]  If 7PM-7AM, please contact night-coverage [email or web address] Password [facility] 11/20/2017, 11:17 AM

## 2017-11-21 ENCOUNTER — Inpatient Hospital Stay (HOSPITAL_COMMUNITY): Payer: Medicare Other

## 2017-11-21 DIAGNOSIS — D62 Acute posthemorrhagic anemia: Secondary | ICD-10-CM

## 2017-11-21 DIAGNOSIS — E1159 Type 2 diabetes mellitus with other circulatory complications: Secondary | ICD-10-CM

## 2017-11-21 DIAGNOSIS — I2510 Atherosclerotic heart disease of native coronary artery without angina pectoris: Secondary | ICD-10-CM

## 2017-11-21 DIAGNOSIS — I2583 Coronary atherosclerosis due to lipid rich plaque: Secondary | ICD-10-CM

## 2017-11-21 LAB — BASIC METABOLIC PANEL
ANION GAP: 8 (ref 5–15)
BUN: 23 mg/dL — ABNORMAL HIGH (ref 6–20)
CALCIUM: 8.2 mg/dL — AB (ref 8.9–10.3)
CO2: 21 mmol/L — AB (ref 22–32)
Chloride: 107 mmol/L (ref 101–111)
Creatinine, Ser: 1.18 mg/dL (ref 0.61–1.24)
GFR, EST NON AFRICAN AMERICAN: 54 mL/min — AB (ref >60)
Glucose, Bld: 149 mg/dL — ABNORMAL HIGH (ref 65–99)
Potassium: 4.1 mmol/L (ref 3.5–5.1)
SODIUM: 136 mmol/L (ref 135–145)

## 2017-11-21 LAB — CBC
HCT: 21 % — ABNORMAL LOW (ref 39.0–52.0)
Hemoglobin: 6.9 g/dL — CL (ref 13.0–17.0)
MCH: 29.6 pg (ref 26.0–34.0)
MCHC: 32.9 g/dL (ref 30.0–36.0)
MCV: 90.1 fL (ref 78.0–100.0)
Platelets: 115 10*3/uL — ABNORMAL LOW (ref 150–400)
RBC: 2.33 MIL/uL — ABNORMAL LOW (ref 4.22–5.81)
RDW: 13.2 % (ref 11.5–15.5)
WBC: 7.3 10*3/uL (ref 4.0–10.5)

## 2017-11-21 LAB — GLUCOSE, CAPILLARY
GLUCOSE-CAPILLARY: 142 mg/dL — AB (ref 65–99)
Glucose-Capillary: 212 mg/dL — ABNORMAL HIGH (ref 65–99)
Glucose-Capillary: 207 mg/dL — ABNORMAL HIGH (ref 65–99)
Glucose-Capillary: 211 mg/dL — ABNORMAL HIGH (ref 65–99)

## 2017-11-21 LAB — PREPARE RBC (CROSSMATCH)

## 2017-11-21 LAB — OCCULT BLOOD X 1 CARD TO LAB, STOOL: FECAL OCCULT BLD: NEGATIVE

## 2017-11-21 LAB — ABO/RH: ABO/RH(D): O POS

## 2017-11-21 MED ORDER — SODIUM CHLORIDE 0.9 % IV SOLN: Freq: Once | INTRAVENOUS | Status: DC

## 2017-11-21 NOTE — Progress Notes (Signed)
Paged [facility] for critical lab, hemoglobin 6.9.

## 2017-11-21 NOTE — Plan of Care (Signed)
  Problem: Coping: Goal: Level of anxiety will decrease Outcome: Completed/Met

## 2017-11-21 NOTE — Progress Notes (Signed)
[facility] PROGRESS NOTE  Jerry Benson ZOX:[id number] DOB: [birth year] DOA: 11/18/2017  PCP: Marden Noble, MD  Brief History/Interval Summary: 84 year old Caucasian male with a past medical history of dementia, coronary artery disease, chronic kidney disease stage III, diabetes mellitus type 2 presented from a skilled nursing facility with hyperglycemia.  There was some concern for acute MI based on EKG findings.  The patient was seen by cardiology.  He was hospitalized for further management.  Reason for Visit: Non-ST elevation MI  Consultants: Cardiology  Procedures: Blood transfusion  Antibiotics: None  Subjective/Interval History: Patient pleasantly confused.  Denies any shortness of breath.  Complains of some pain in his left side from a recent fall.  ROS: Denies any nausea or vomiting.  Objective:  Vital Signs  Vitals:   11/21/17 0901 11/21/17 0935 11/21/17 1005 11/21/17 1105  BP: 127/76  (!) 127/111 (!) 157/90  Pulse: 80     Resp: (!) 26  (!) 21   Temp: (!) 97.5 F (36.4 C) 98.6 F (37 C)    TempSrc: Axillary Oral    SpO2: 99%     Weight:      Height:        Intake/Output Summary (Last 24 hours) at 11/21/2017 1234 Last data filed at 11/21/2017 1029 Gross per 24 hour  Intake 2493.75 ml  Output 1525 ml  Net 968.75 ml   Filed Weights   11/19/17 1040 11/21/17 0450  Weight: 55 kg (121 lb 4.8 oz) 55.5 kg (122 lb 5.7 oz)    General appearance: alert, cooperative, appears stated age, distracted and no distress Head: Normocephalic, without obvious abnormality, atraumatic Resp: clear to auscultation bilaterally Cardio: regular rate and rhythm, S1, S2 normal, no murmur, click, rub or gallop GI: soft, non-tender; bowel sounds normal; no masses,  no organomegaly Extremities: extremities normal, atraumatic, no cyanosis or edema.  Bruising noted on the left buttock area. Pulses: 2+ and symmetric Neurologic: No focal neurological deficits.  He is  disoriented.  Lab Results:  Data Reviewed: I have personally reviewed following labs and imaging studies  CBC: Recent Labs  Lab 11/19/17 0217 11/20/17 1133 11/21/17 0435  WBC 11.8* 8.3 7.3  HGB 11.0* 8.3* 6.9*  HCT 33.3* 25.5* 21.0*  MCV 89.0 88.9 90.1  PLT 141* 137* 115*    Basic Metabolic Panel: Recent Labs  Lab 11/15/17 11/19/17 0217 11/19/17 1132 11/21/17 0435  NA 139 136 138 136  K 4.1 4.3 4.1 4.1  CL  --  103 108 107  CO2  --  21* 22 21*  GLUCOSE  --  454* 174* 149*  BUN 24* 39* 30* 23*  CREATININE 1.2 1.52* 1.29* 1.18  CALCIUM  --  9.1 9.1 8.2*    GFR: Estimated Creatinine Clearance: 35.3 mL/min (by C-G formula based on SCr of 1.18 mg/dL).  Coagulation Profile: Recent Labs  Lab 11/19/17 0217  INR 1.07    Cardiac Enzymes: Recent Labs  Lab 11/19/17 0217  TROPONINI 0.53*    CBG: Recent Labs  Lab 11/20/17 1134 11/20/17 1813 11/20/17 2149 11/21/17 0715 11/21/17 1114  GLUCAP 246* 193* 168* 142* 212*     Radiology Studies: Ct Abdomen Pelvis Wo Contrast  Result Date: 11/21/2017 CLINICAL DATA:  LEFT-sided pain after fall EXAM: CT ABDOMEN AND PELVIS WITHOUT CONTRAST TECHNIQUE: Multidetector CT imaging of the abdomen and pelvis was performed following the standard protocol without IV contrast. COMPARISON:  None. FINDINGS: Lower chest: Lung bases are clear. Hepatobiliary: No focal hepatic lesion. Postcholecystectomy. No biliary  dilatation. Pancreas: Pancreas is normal. No ductal dilatation. No pancreatic inflammation. Spleen: Normal spleen Adrenals/urinary tract: Adrenal glands and kidneys are normal. The ureters and bladder normal. Stomach/Bowel: Stomach, small bowel, appendix, and cecum are normal. The colon and rectosigmoid colon are normal. Vascular/Lymphatic: Abdominal aorta is normal caliber with atherosclerotic calcification. There is no retroperitoneal or periportal lymphadenopathy. No pelvic lymphadenopathy. Reproductive: Prostate normal Other:  No free fluid. Musculoskeletal: Is hematoma within the LEFT gluteus muscle inferior to the ischium measuring 9.8 x 4.7 cm (image 91/3. The entirety of this hematoma is not imaged on the axial imaging however on the coronal imaging the inferior margin of the hematoma appears to be included (image 63/6). no evidence of active extravasation. No pelvic fracture or hip fracture identified. Minimally displaced fractures of the posterior tenth eleventh and twelfth ribs (image 11 and 19 of series 4 and image 27 series 3. A transverse process fracture on the LEFT at L3 is partially healed. IMPRESSION: 1. Hematoma within the LEFT gluteus muscle inferior to the ischium. No evidence of active extravasation. 2. No pelvic fracture 3. Minimally displaced fractures of the LEFT tenth, eleventh, and twelfth ribs. Electronically Signed   By: Genevive Bi M.D.   On: 11/21/2017 09:50   Dg Sacrum/coccyx  Result Date: 11/19/2017 CLINICAL DATA:  Acute sacral/coccygeal pain. EXAM: SACRUM AND COCCYX - 2+ VIEW COMPARISON:  11/09/2017 radiographs FINDINGS: There is no evidence of fracture or other focal bone lesions. Degenerative changes in the LOWER lumbar spine again noted. IMPRESSION: No acute abnormality Degenerative changes in the LOWER lumbar spine. Electronically Signed   By: Harmon Pier M.D.   On: 11/19/2017 21:21     Medications:  Scheduled: . aspirin EC  81 mg Oral Daily  . donepezil  5 mg Oral QHS  . insulin aspart  0-5 Units Subcutaneous QHS  . insulin aspart  0-9 Units Subcutaneous TID WC  . linagliptin  5 mg Oral Daily  . metoprolol tartrate  12.5 mg Oral Daily  . rosuvastatin  5 mg Oral Daily  . sertraline  50 mg Oral Daily   Continuous: . sodium chloride 75 mL/hr at 11/20/17 2332  . sodium chloride     ZOX:[id number], dextrose, nitroGLYCERIN, ondansetron (ZOFRAN) IV  Assessment/Plan:    Acute blood loss anemia Patient's hemoglobin noted to drop to 6.9 this morning.  No overt bleeding has  been noted.  Due to his recent fall and bruising in the left side CT scan of the abdomen pelvis was done.  This did reveal a hematoma in the left gluteus muscle.  No pelvic fractures noted.  The fractures were appreciated.  No active extravasation was seen.  Patient's anticoagulation was a discontinued yesterday.  He was on IV heparin for the MI.  He is also noted to be on aspirin and Plavix.  We will hold it for a few days.  He will be transfused 2 units of blood.  Hematoma in Left gluteus muscle  This is most likely result of his recent fall.  We will hold his antiplatelet agents for for a day or 2.  Transfuse him 2 units.  No pelvic fractures noted on CT scan.  Repeat labs tomorrow.  Non-ST elevation MI in the setting of known coronary artery disease Patient seen by cardiology.  Due to his age and dementia he is not considered a candidate for any further work-up.  He was placed on heparin intravenously which was discontinued due to his drop in hemoglobin.  He was placed  on aspirin and was already on Plavix.  These will be held for a day or 2 due to his active bleeding.  Continue with beta-blocker.  Continue statin.  Monitor blood pressures closely.  Diabetes mellitus type 2 with hyperglycemia Blood glucose levels initially in 500s.  He was initially on insulin infusion.  Now transitioned to linagliptin.  Currently during his previous hospitalization recently he had episodes of hypoglycemia and so was discharged home without any insulin.  HbA1c 6.8 on May 7.  In this elderly gentleman we would not strive for strict glycemic control.   History of dementia Apparently he lives at home with his elderly wife and mentally challenged daughter.  PT and OT consulted.  Most likely patient will end up needing skilled nursing facility placement for rehab.  Continue Aricept.  DVT Prophylaxis: SCDs for now    Code Status: DNR Family Communication: No family at bedside Disposition Plan: Management as outlined  above.    LOS: 2 days   Osvaldo Shipper  [facility] Pager [phone] 11/21/2017, 12:34 PM  If 7PM-7AM, please contact night-coverage at [email or web address], password [facility]

## 2017-11-21 NOTE — Progress Notes (Signed)
Results of CT given to Dr. Rito Ehrlich. I held ASA and plavix as he asked for me to this am and blood transfusion is running. Vital signs stable.

## 2017-11-21 NOTE — Progress Notes (Signed)
Pt had a short 11 beat run of atrial tachycardia. He is asymptomatic. I texted MD to be made aware

## 2017-11-22 LAB — TYPE AND SCREEN
ABO/RH(D): O POS
ANTIBODY SCREEN: NEGATIVE
Unit division: 0
Unit division: 0

## 2017-11-22 LAB — BPAM RBC
BLOOD PRODUCT EXPIRATION DATE: 201905242359
Blood Product Expiration Date: 201906132359
ISSUE DATE / TIME: 201905190911
ISSUE DATE / TIME: 201905191300
UNIT TYPE AND RH: 5100
Unit Type and Rh: 5100

## 2017-11-22 LAB — GLUCOSE, CAPILLARY
GLUCOSE-CAPILLARY: 201 mg/dL — AB (ref 65–99)
Glucose-Capillary: 128 mg/dL — ABNORMAL HIGH (ref 65–99)
Glucose-Capillary: 211 mg/dL — ABNORMAL HIGH (ref 65–99)
Glucose-Capillary: 194 mg/dL — ABNORMAL HIGH (ref 65–99)

## 2017-11-22 LAB — CBC
HEMATOCRIT: 31.4 % — AB (ref 39.0–52.0)
HEMOGLOBIN: 10.7 g/dL — AB (ref 13.0–17.0)
MCH: 29.4 pg (ref 26.0–34.0)
MCHC: 34.1 g/dL (ref 30.0–36.0)
MCV: 86.3 fL (ref 78.0–100.0)
Platelets: 137 10*3/uL — ABNORMAL LOW (ref 150–400)
RBC: 3.64 MIL/uL — ABNORMAL LOW (ref 4.22–5.81)
RDW: 13.5 % (ref 11.5–15.5)
WBC: 8.2 10*3/uL (ref 4.0–10.5)

## 2017-11-22 LAB — BASIC METABOLIC PANEL
ANION GAP: 10 (ref 5–15)
BUN: 21 mg/dL — ABNORMAL HIGH (ref 6–20)
CHLORIDE: 107 mmol/L (ref 101–111)
CO2: 21 mmol/L — AB (ref 22–32)
Calcium: 8.9 mg/dL (ref 8.9–10.3)
Creatinine, Ser: 1.16 mg/dL (ref 0.61–1.24)
GFR calc non Af Amer: 55 mL/min — ABNORMAL LOW (ref >60)
Glucose, Bld: 110 mg/dL — ABNORMAL HIGH (ref 65–99)
Potassium: 4.1 mmol/L (ref 3.5–5.1)
Sodium: 138 mmol/L (ref 135–145)

## 2017-11-22 NOTE — [place] FL2 (Signed)
[place] MEDICAID FL2 LEVEL OF CARE SCREENING TOOL     IDENTIFICATION  Patient Name: Jerry Benson Birthdate: [birth year] Sex: male Admission Date (Current Location): 11/18/2017  [facility] and [place] Number:  Producer, television/film/video and Address:  [facility] [place], [address] [place], [place] [address]      Provider Number: [id number]  Attending Physician Name and Address:  Osvaldo Shipper, MD  Relative Name and Phone Number:  Janis Cuffe, spouse, [phone]    Current Level of Care: Hospital Recommended Level of Care: Skilled Nursing Facility Prior Approval Number:    Date Approved/Denied:   PASRR Number: [id number]  Discharge Plan: SNF    Current Diagnoses: Patient Active Problem List   Diagnosis Date Noted  . NSTEMI (non-ST elevated myocardial infarction) (HCC) 11/19/2017  . Hyperglycemia 11/19/2017  . Pressure injury of skin 11/09/2017  . Hypoglycemia 11/08/2017  . Fall at home, initial encounter 11/08/2017  . Closed rib fracture 11/08/2017  . Word finding difficulty 04/14/2016  . Acute encephalopathy 04/14/2016  . Expressive aphasia 04/14/2016  . Hypertension 04/14/2016  . TIA (transient ischemic attack) 07/11/2015  . Memory disorder 05/28/2014  . Postherpetic neuralgia 09/25/2013  . Intertriginous candidiasis 01/12/2013  . Iron deficiency anemia 09/15/2012  . Malaise and fatigue 11/16/2011  . Unstable angina pectoris (HCC) 09/19/2011  . CKD (chronic kidney disease) stage 3, GFR 30-59 ml/min (HCC) 09/19/2011  . CAD (coronary artery disease) 01/21/2011  . Type 2 diabetes mellitus with vascular disease (HCC) 10/17/2010  . Hypercholesterolemia 10/17/2010  . Hx of CABG 10/17/2010  . Benign hypertensive heart disease without heart failure 10/17/2010  . Dyspepsia 10/17/2010    Orientation RESPIRATION BLADDER Height & Weight     Self, Place  Normal External catheter, Continent Weight: 126 lb 12.2 oz (57.5 kg) Height:   (170.2 cm)   BEHAVIORAL SYMPTOMS/MOOD NEUROLOGICAL BOWEL NUTRITION STATUS      Continent Diet(please see DC summary)  AMBULATORY STATUS COMMUNICATION OF NEEDS Skin   Extensive Assist Verbally PU Stage and Appropriate Care(PU stage II - upper and lower lumbar; PU coccyx)                       Personal Care Assistance Level of Assistance  Bathing, Feeding, Dressing Bathing Assistance: Limited assistance Feeding assistance: Limited assistance Dressing Assistance: Limited assistance     Functional Limitations Info  Sight, Hearing, Speech Sight Info: Adequate Hearing Info: Adequate Speech Info: Adequate    SPECIAL CARE FACTORS FREQUENCY  PT (By licensed PT), OT (By licensed OT)     PT Frequency: 5x/week OT Frequency: 5x/week            Contractures Contractures Info: Not present    Additional Factors Info  Code Status, Allergies, Psychotropic, Insulin Sliding Scale Code Status Info: DNR Allergies Info: Amlodipine, Lipitor Atorvastatin Calcium, Lisinopril Psychotropic Info: aricept, zoloft Insulin Sliding Scale Info: novolog 3x/day with meals and at bedtime       Current Medications (11/22/2017):  This is the current hospital active medication list Current Facility-Administered Medications  Medication Dose Route Frequency Provider Last Rate Last Dose  . 0.9 %  sodium chloride infusion   Intravenous Once Audrea Muscat T, NP      . acetaminophen (TYLENOL) tablet 650 mg  650 mg Oral Q4H PRN Hillary Bow, DO   650 mg at 11/22/17 1249  . aspirin EC tablet 81 mg  81 mg Oral Daily Hillary Bow, DO  81 mg at 11/20/17 1004  . dextrose 50 % solution 25 mL  25 mL Intravenous PRN Hillary Bow, DO      . donepezil (ARICEPT) tablet 5 mg  5 mg Oral QHS Lyda Perone M, DO   5 mg at 11/21/17 1942  . insulin aspart (novoLOG) injection 0-5 Units  0-5 Units Subcutaneous QHS Alwyn Ren, MD   2 Units at 11/21/17 2211  . insulin aspart (novoLOG) injection 0-9 Units  0-9 Units  Subcutaneous TID WC Alwyn Ren, MD   3 Units at 11/22/17 1248  . linagliptin (TRADJENTA) tablet 5 mg  5 mg Oral Daily Alwyn Ren, MD   5 mg at 11/22/17 1610  . metoprolol tartrate (LOPRESSOR) tablet 12.5 mg  12.5 mg Oral Daily Alwyn Ren, MD   12.5 mg at 11/22/17 9604  . nitroGLYCERIN (NITROSTAT) SL tablet 0.4 mg  0.4 mg Sublingual Q5 Min x 3 PRN Hillary Bow, DO      . ondansetron [facility]) injection 4 mg  4 mg Intravenous Q6H PRN Hillary Bow, DO      . rosuvastatin (CRESTOR) tablet 5 mg  5 mg Oral Daily Lyda Perone M, DO   5 mg at 11/22/17 0831  . sertraline (ZOLOFT) tablet 50 mg  50 mg Oral Daily Lyda Perone M, DO   50 mg at 11/22/17 5409     Discharge Medications: Please see discharge summary for a list of discharge medications.  Relevant Imaging Results:  Relevant Lab Results:   Additional Information SSN: [id number]  Abigail Butts, LCSW

## 2017-11-22 NOTE — Plan of Care (Signed)
  Problem: Education: Goal: Knowledge of General Education information will improve Outcome: Progressing   Problem: Safety: Goal: Ability to remain free from injury will improve Outcome: Progressing   

## 2017-11-22 NOTE — Progress Notes (Addendum)
3:40 pm CSW gave SNF bed offers to patient's sister, Ola - [facility], [facility], [facility], and [facility]. Ola to review with the family and follow up with choice. CSW answered Ola's questions about insurance coverage and applying for Medicaid for long term care.  1:52 pm CSW noted therapy evaluations completed and sent out initial SNF referrals. Awaiting bed offers and will provide to family when available. Patient will require [facility] authorization before admitting to facility. CSW to follow and support with discharge planning.  Abigail Butts, LCSWA [phone]

## 2017-11-22 NOTE — Progress Notes (Addendum)
Inpatient Diabetes Program Recommendations  AACE/ADA: New Consensus Statement on Inpatient Glycemic Control (2015)  Target Ranges:  Prepandial:   less than 140 mg/dL      Peak postprandial:   less than 180 mg/dL (1-2 hours)      Critically ill patients:  140 - 180 mg/dL   Results for Jerry Benson, Jerry Benson (MRN [id number]) as of 11/22/2017 10:56  Ref. Range 11/21/2017 07:15 11/21/2017 11:14 11/21/2017 16:36 11/21/2017 21:06 11/22/2017 07:43  Glucose-Capillary Latest Ref Range: 65 - 99 mg/dL 284 (H) 132 (H) 440 (H) 211 (H) 128 (H)   Review of Glycemic Control  Diabetes history: Type 2 DM Outpatient Diabetes medications: none Current orders for Inpatient glycemic control:   Novolog Sensitive Correction 0-9 units tid + Novolog HS scale 0-5 units, Tradjenta 5 mg Daily  Inpatient Diabetes Program Recommendations:    Patient still having hyperglycemia on Tradjenta,  Consider adding very low dose Glipizide 2.5 mg Daily while inpatient to see if patient can tolerate dose to determine needs for SNF. Consider d/cing Novolog Correction scale.  Thanks,  Christena Deem RN, MSN, BC-ADM, [facility] Inpatient Diabetes Coordinator Team Pager [phone] (8a-5p)

## 2017-11-22 NOTE — Progress Notes (Signed)
Pt walks with a rollator and is assisted for ADL and IADL. He presents with generalized weakness, poor standing balance, pain in his buttocks and baseline cognitive impairment. He requires min to total assist for ADL. Recommending SNF level rehab. Will follow acutely.  11/22/17 1200  OT Visit Information  Last OT Received On 11/22/17  Assistance Needed +1 (2 to increase ambulation distance)  History of Present Illness 84 yo male with onset of hyperglycemia from SNF was admitted, diagnosed with NSTEMI but cleared for STEMI.  Sustained a fall at SNF and has L side rib fractures at T10, ll and 12 along with transverse process fracture L3 and hematoma on L gluteal muscle inferior to ischium.  PMHx:  CAD, CKD3, DM, HLD, stents, DJD lumbar spine,    Precautions  Precautions Fall  Restrictions  Weight Bearing Restrictions No  Home Living  Family/patient expects to be discharged to: Private residence  Living Arrangements Spouse/significant other  Available Help at Discharge Family;Available 24 hours/day  Type of Home House  Home Access Stairs to enter  Entrance Stairs-Number of Steps 1  Entrance Stairs-Rails Left  Home Layout One level  Engineer, water - 4 wheels  Additional Comments pt was at SNF after is previous admission  Prior Function  Level of Independence Needs assistance  Gait / Transfers Assistance Needed rollator in the house with family to assist  ADL's / Homemaking Assistance Needed wife cares for pt and the home  Comments pt is a poor historian  Geneticist, molecular HOH  Pain Assessment  Pain Assessment Faces  Faces Pain Scale 6  Pain Location buttocks  Pain Descriptors / Indicators Discomfort;Guarding  Pain Intervention(s) Monitored during session;Repositioned  Cognition  Arousal/Alertness Awake/alert  Behavior During Therapy WFL for tasks assessed/performed  Overall Cognitive Status History of cognitive impairments - at baseline   Upper Extremity Assessment  Upper Extremity Assessment Generalized weakness  Lower Extremity Assessment  Lower Extremity Assessment Defer to PT evaluation  Cervical / Trunk Assessment  Cervical / Trunk Assessment Kyphotic  ADL  Overall ADL's  Needs assistance/impaired  Eating/Feeding Set up;Bed level  Grooming Wash/dry hands;Bed level;Set up  Upper Body Bathing Minimal assistance;Sitting  Lower Body Bathing Total assistance;Sit to/from stand  Upper Body Dressing  Minimal assistance;Sitting  Lower Body Dressing Total assistance;Sit to/from Scientist, research (life sciences) Moderate assistance;Stand-pivot  Toileting- Clothing Manipulation and Hygiene Total assistance;Sit to/from stand  Vision- History  Patient Visual Report No change from baseline  Bed Mobility  Overal bed mobility Needs Assistance  Bed Mobility Sit to Supine  Sit to supine Mod assist  General bed mobility comments assist for LEs back into bed, 2 person assist to pull up in bed  Transfers  Overall transfer level Needs assistance  Equipment used 1 person hand held assist  Transfers Sit to/from Stand;Stand Pivot Transfers  Sit to Stand Min assist  Stand pivot transfers Mod assist  General transfer comment assist to rise and steady as pt stood from chair and pivoted to bed  Balance  Overall balance assessment Needs assistance  Sitting balance-Leahy Scale Fair  Standing balance-Leahy Scale Poor  OT - End of Session  Equipment Utilized During Treatment Gait belt  Activity Tolerance Patient tolerated treatment well  Patient left in bed;with bed alarm set;with call bell/phone within reach  Nurse Communication Mobility status  OT Assessment  OT Recommendation/Assessment Patient needs continued OT Services  OT Visit Diagnosis Unsteadiness on feet (R26.81);Other abnormalities of gait and mobility (R26.89);Pain;Muscle weakness (  generalized) (M62.81);Other symptoms and signs involving cognitive function  OT Problem List Decreased  strength;Decreased activity tolerance;Impaired balance (sitting and/or standing);Decreased cognition;Decreased coordination;Decreased safety awareness;Decreased knowledge of use of DME or AE;Pain  OT Plan  OT Frequency (ACUTE ONLY) Min 2X/week  OT Treatment/Interventions (ACUTE ONLY) Self-care/ADL training;DME and/or AE instruction;Patient/family education;Balance training;Therapeutic activities  AM-PAC OT "6 Clicks" Daily Activity Outcome Measure  Help from another person eating meals? 3  Help from another person taking care of personal grooming? 3  Help from another person toileting, which includes using toliet, bedpan, or urinal? 2  Help from another person bathing (including washing, rinsing, drying)? 2  Help from another person to put on and taking off regular upper body clothing? 3  Help from another person to put on and taking off regular lower body clothing? 1  6 Click Score 14  ADL G Code Conversion CK  OT Recommendation  Follow Up Recommendations SNF;Supervision/Assistance - 24 hour  Individuals Consulted  Consulted and Agree with Results and Recommendations Patient  Acute Rehab OT Goals  Patient Stated Goal wants to go home   OT Goal Formulation With patient  Time For Goal Achievement 12/06/17  Potential to Achieve Goals Good  OT Time Calculation  OT Start Time (ACUTE ONLY) 1158  OT Stop Time (ACUTE ONLY) 1218  OT Time Calculation (min) 20 min  OT General Charges  $OT Visit 1 Visit  OT Evaluation  $OT Eval Moderate Complexity 1 Mod  Written Expression  Dominant Hand Right  11/22/2017 Martie Round, OTR/L Pager: [phone]

## 2017-11-22 NOTE — Progress Notes (Signed)
[facility] PROGRESS NOTE  Jerry Benson ZOX:[id number] DOB: 11-16-1928 DOA: 11/18/2017  PCP: Marden Noble, MD  Brief History/Interval Summary: 84 year old Caucasian male with a past medical history of dementia, coronary artery disease, chronic kidney disease stage III, diabetes mellitus type 2 presented from a skilled nursing facility with hyperglycemia.  There was some concern for acute MI based on EKG findings.  The patient was seen by cardiology.  He was hospitalized for further management.  Reason for Visit: Non-ST elevation MI  Consultants: Cardiology  Procedures: Blood transfusion  Antibiotics: None  Subjective/Interval History: Patient pleasantly confused.  Denies any complaints.  ROS: No nausea or vomiting.  No shortness of breath.  Objective:  Vital Signs  Vitals:   11/21/17 1605 11/21/17 2107 11/22/17 0447 11/22/17 0829  BP: 124/76 131/78 128/82 132/80  Pulse:  70 81 82  Resp: 20 (!) 25 (!) 22   Temp: 98.7 F (37.1 C) 98.8 F (37.1 C) 98.4 F (36.9 C)   TempSrc: Oral Oral Oral   SpO2: 95% 98% 98%   Weight:   57.5 kg (126 lb 12.2 oz)   Height:        Intake/Output Summary (Last 24 hours) at 11/22/2017 1124 Last data filed at 11/22/2017 0603 Gross per 24 hour  Intake 1700 ml  Output 1500 ml  Net 200 ml   Filed Weights   11/19/17 1040 11/21/17 0450 11/22/17 0447  Weight: 55 kg (121 lb 4.8 oz) 55.5 kg (122 lb 5.7 oz) 57.5 kg (126 lb 12.2 oz)    General appearance: alert, cooperative, no distress and Not as pale as yesterday Head: Normocephalic, without obvious abnormality, atraumatic Resp: clear to auscultation bilaterally Cardio: regular rate and rhythm, S1, S2 normal, no murmur, click, rub or gallop GI: soft, non-tender; bowel sounds normal; no masses,  no organomegaly Extremities: extremities normal, atraumatic, no cyanosis or edema Neurologic: Presently confused.  No focal deficits.  Lab Results:  Data Reviewed: I have personally  reviewed following labs and imaging studies  CBC: Recent Labs  Lab 11/19/17 0217 11/20/17 1133 11/21/17 0435 11/22/17 0451  WBC 11.8* 8.3 7.3 8.2  HGB 11.0* 8.3* 6.9* 10.7*  HCT 33.3* 25.5* 21.0* 31.4*  MCV 89.0 88.9 90.1 86.3  PLT 141* 137* 115* 137*    Basic Metabolic Panel: Recent Labs  Lab 11/19/17 0217 11/19/17 1132 11/21/17 0435 11/22/17 0451  NA 136 138 136 138  K 4.3 4.1 4.1 4.1  CL 103 108 107 107  CO2 21* 22 21* 21*  GLUCOSE 454* 174* 149* 110*  BUN 39* 30* 23* 21*  CREATININE 1.52* 1.29* 1.18 1.16  CALCIUM 9.1 9.1 8.2* 8.9    GFR: Estimated Creatinine Clearance: 37.2 mL/min (by C-G formula based on SCr of 1.16 mg/dL).  Coagulation Profile: Recent Labs  Lab 11/19/17 0217  INR 1.07    Cardiac Enzymes: Recent Labs  Lab 11/19/17 0217  TROPONINI 0.53*    CBG: Recent Labs  Lab 11/21/17 0715 11/21/17 1114 11/21/17 1636 11/21/17 2106 11/22/17 0743  GLUCAP 142* 212* 207* 211* 128*      Radiology Studies: Ct Abdomen Pelvis Wo Contrast  Result Date: 11/21/2017 CLINICAL DATA:  LEFT-sided pain after fall EXAM: CT ABDOMEN AND PELVIS WITHOUT CONTRAST TECHNIQUE: Multidetector CT imaging of the abdomen and pelvis was performed following the standard protocol without IV contrast. COMPARISON:  None. FINDINGS: Lower chest: Lung bases are clear. Hepatobiliary: No focal hepatic lesion. Postcholecystectomy. No biliary dilatation. Pancreas: Pancreas is normal. No ductal dilatation. No pancreatic  inflammation. Spleen: Normal spleen Adrenals/urinary tract: Adrenal glands and kidneys are normal. The ureters and bladder normal. Stomach/Bowel: Stomach, small bowel, appendix, and cecum are normal. The colon and rectosigmoid colon are normal. Vascular/Lymphatic: Abdominal aorta is normal caliber with atherosclerotic calcification. There is no retroperitoneal or periportal lymphadenopathy. No pelvic lymphadenopathy. Reproductive: Prostate normal Other: No free fluid.  Musculoskeletal: Is hematoma within the LEFT gluteus muscle inferior to the ischium measuring 9.8 x 4.7 cm (image 91/3. The entirety of this hematoma is not imaged on the axial imaging however on the coronal imaging the inferior margin of the hematoma appears to be included (image 63/6). no evidence of active extravasation. No pelvic fracture or hip fracture identified. Minimally displaced fractures of the posterior tenth eleventh and twelfth ribs (image 11 and 19 of series 4 and image 27 series 3. A transverse process fracture on the LEFT at L3 is partially healed. IMPRESSION: 1. Hematoma within the LEFT gluteus muscle inferior to the ischium. No evidence of active extravasation. 2. No pelvic fracture 3. Minimally displaced fractures of the LEFT tenth, eleventh, and twelfth ribs. Electronically Signed   By: Genevive Bi M.D.   On: 11/21/2017 09:50     Medications:  Scheduled: . aspirin EC  81 mg Oral Daily  . donepezil  5 mg Oral QHS  . insulin aspart  0-5 Units Subcutaneous QHS  . insulin aspart  0-9 Units Subcutaneous TID WC  . linagliptin  5 mg Oral Daily  . metoprolol tartrate  12.5 mg Oral Daily  . rosuvastatin  5 mg Oral Daily  . sertraline  50 mg Oral Daily   Continuous: . sodium chloride 50 mL/hr at 11/22/17 0603  . sodium chloride     AVW:[id number], dextrose, nitroGLYCERIN, ondansetron (ZOFRAN) IV  Assessment/Plan:   Acute blood loss anemia Patient's hemoglobin noted to drop to 6.9 on the morning of 5/19.  No overt bleeding has been noted. Due to his recent fall and bruising in the left side CT scan of the abdomen pelvis was done. This did reveal a hematoma in the left gluteus muscle. ?No pelvic fractures noted. ?The fractures were appreciated. ?No active extravasation was seen. ?He was on IV heparin for MI which was stopped. ?He is also noted to be on aspirin and Plavix. ?We will hold Plavix for a few days. ?He was transfused 2 units of blood.  Hemoglobin has responded  appropriately.  Repeat labs tomorrow.  Hematoma in Left gluteus muscle  This is most likely result of his recent fall. ?We will hold his plavix for 3 days.  He was transfused 2 units of blood.  No pelvic fractures noted on CT scan.  Globin responded appropriately.  Non-ST elevation MI in the setting of known coronary artery disease Patient seen by cardiology. ?Due to his age and dementia he is not considered a candidate for any further work-up. ?He was placed on heparin intravenously which was discontinued due to his drop in hemoglobin. ?He was placed on aspirin and was already on Plavix. Plavix will be held for 3 days due to his active bleeding. ?Continue with beta-blocker. ?Continue statin. ?Monitor blood pressures closely.  Diabetes mellitus type 2 with hyperglycemia Blood glucose levels initially in 500s. ?He was initially on insulin infusion. Then transitioned?to linagliptin. ?During his previous hospitalization recently he had episodes of hypoglycemia and so was discharged home withoutany insulin. ?HbA1c 6.8 on May 7. ?In this elderly gentleman we would not strive for strict glycemic control. ?  History of dementia Apparently  he lives at home with his elderly wife and mentally challenged daughter. PT and OT consulted. ?Most likely patient will end up needing skilled nursing facility placement for rehab. ?Continue Aricept.   DVT Prophylaxis: SCDs for now    Code Status: DNR Family Communication:  Discussed with the patient.  Also discussed with his wife over the phone. Disposition Plan:  If he remains stable he could be sent back to the skilled nursing facility tomorrow.     LOS: 3 days   Osvaldo Shipper  [facility] Pager [phone] 11/22/2017, 11:24 AM  If 7PM-7AM, please contact night-coverage at [email or web address], password [email or web address]

## 2017-11-22 NOTE — Evaluation (Signed)
Physical Therapy Evaluation Patient Details Name: Jerry Benson MRN: [id number] DOB: 30-Nov-1928 Today's Date: 11/22/2017   History of Present Illness  84 yo male with onset of hyperglycemia from SNF was admitted, diagnosed with NSTEMI but cleared for STEMI.  Sustained a fall at SNF and has L side rib fractures at T10, ll and 12 along with transverse process fracture L3 and hematoma on L gluteal muscle inferior to ischium.  PMHx:  CAD, CKD3, DM, HLD, stents, DJD lumbar spine,    Clinical Impression  Pt is up to walk a couple steps from bedside to chair with significant assistance for directions and for safety.  Pt is slow to move but in pain from recent fall and weaker so is a good candidate for SNF return.  He was at home prior to recent SNF admission and will anticipate they will continue to work toward home.  Acute therapy for progression of mobility as tolerated to increase standing and LE strength.    Follow Up Recommendations SNF;Supervision/Assistance - 24 hour    Equipment Recommendations  None recommended by PT(await SNF decisions)    Recommendations for Other Services       Precautions / Restrictions Precautions Precautions: Fall Restrictions Weight Bearing Restrictions: No      Mobility  Bed Mobility Overal bed mobility: Needs Assistance Bed Mobility: Supine to Sit     Supine to sit: Mod assist     General bed mobility comments: assisted trunk and then used bed pad to assist LE's  Transfers Overall transfer level: Needs assistance Equipment used: Rolling walker (2 wheeled);1 person hand held assist Transfers: Sit to/from [employer] Sit to Stand: Mod assist         General transfer comment: weak and cannot remain up without continual upward support  Ambulation/Gait             General Gait Details: steps were to transfer to the chair  Stairs            Wheelchair Mobility    Modified Rankin (Stroke Patients Only)        Balance Overall balance assessment: Needs assistance Sitting-balance support: Feet supported;Bilateral upper extremity supported Sitting balance-Leahy Scale: Fair     Standing balance support: Bilateral upper extremity supported Standing balance-Leahy Scale: Poor                               Pertinent Vitals/Pain Pain Assessment: Faces Faces Pain Scale: Hurts little more Pain Location:  L side Pain Intervention(s): Limited activity within patient's tolerance;Monitored during session;Repositioned    Home Living Family/patient expects to be discharged to:: Private residence Living Arrangements: Spouse/significant other Available Help at Discharge: Family;Available 24 hours/day Type of Home: House Home Access: Stairs to enter Entrance Stairs-Rails: Left Entrance Stairs-Number of Steps: 1 Home Layout: One level Home Equipment: Walker - 4 wheels Additional Comments: Pt was at home before he went to SNF and returned to hosp    Prior Function Level of Independence: Needs assistance   Gait / Transfers Assistance Needed: rollator in the house with family to assist  ADL's / Homemaking Assistance Needed: wife cares for pt and the home  Comments: pt does not appear to remember the trip to SNF     Hand Dominance        Extremity/Trunk Assessment   Upper Extremity Assessment Upper Extremity Assessment: Overall WFL for tasks assessed    Lower Extremity Assessment Lower Extremity  Assessment: Generalized weakness    Cervical / Trunk Assessment Cervical / Trunk Assessment: Kyphotic  Communication   Communication: No difficulties  Cognition Arousal/Alertness: Awake/alert Behavior During Therapy: WFL for tasks assessed/performed Overall Cognitive Status: History of cognitive impairments - at baseline                                        General Comments      Exercises     Assessment/Plan    PT Assessment Patient needs continued PT  services  PT Problem List Decreased strength;Decreased range of motion;Decreased activity tolerance;Decreased balance;Decreased mobility;Decreased coordination;Decreased cognition;Decreased knowledge of use of DME;Decreased safety awareness;Decreased knowledge of precautions;Cardiopulmonary status limiting activity;Decreased skin integrity;Pain       PT Treatment Interventions DME instruction;Gait training;Stair training;Functional mobility training;Therapeutic activities;Therapeutic exercise;Balance training;Neuromuscular re-education;Patient/family education    PT Goals (Current goals can be found in the Care Plan section)  Acute Rehab PT Goals Patient Stated Goal: unable to state PT Goal Formulation: With patient Time For Goal Achievement: 12/06/17 Potential to Achieve Goals: Fair    Frequency Min 2X/week   Barriers to discharge Decreased caregiver support;Inaccessible home environment stair to enter and home with just wife    Co-evaluation               AM-PAC PT "6 Clicks" Daily Activity  Outcome Measure Difficulty turning over in bed (including adjusting bedclothes, sheets and blankets)?: Unable Difficulty moving from lying on back to sitting on the side of the bed? : Unable Difficulty sitting down on and standing up from a chair with arms (e.g., wheelchair, bedside commode, etc,.)?: Unable Help needed moving to and from a bed to chair (including a wheelchair)?: A Lot Help needed walking in hospital room?: A Little Help needed climbing 3-5 steps with a railing? : Total 6 Click Score: 9    End of Session Equipment Utilized During Treatment: Gait belt Activity Tolerance: Patient limited by fatigue;Treatment limited secondary to medical complications (Comment) Patient left: in chair;with call bell/phone within reach;with chair alarm set Nurse Communication: Mobility status PT Visit Diagnosis: Unsteadiness on feet (R26.81);Muscle weakness (generalized) (M62.81);Difficulty  in walking, not elsewhere classified (R26.2);Adult, failure to thrive (R62.7);Pain;History of falling (Z91.81) Pain - Right/Left: Left Pain - part of body: (ribcage)    Time: 0865-7846 PT Time Calculation (min) (ACUTE ONLY): 28 min   Charges:   PT Evaluation $PT Eval Moderate Complexity: 1 Mod PT Treatments $Therapeutic Activity: 8-22 mins   PT G Codes:   PT G-Codes **NOT FOR INPATIENT CLASS** Functional Assessment Tool Used: AM-PAC 6 Clicks Basic Mobility    Ivar Drape 11/22/2017, 12:45 PM   Samul Dada, PT MS Acute Rehab Dept. Number: [facility] [id number] and [facility] [phone]

## 2017-11-23 LAB — CBC
HEMATOCRIT: 33.8 % — AB (ref 39.0–52.0)
Hemoglobin: 11.3 g/dL — ABNORMAL LOW (ref 13.0–17.0)
MCH: 29.1 pg (ref 26.0–34.0)
MCHC: 33.4 g/dL (ref 30.0–36.0)
MCV: 87.1 fL (ref 78.0–100.0)
Platelets: 154 10*3/uL (ref 150–400)
RBC: 3.88 MIL/uL — ABNORMAL LOW (ref 4.22–5.81)
RDW: 13.7 % (ref 11.5–15.5)
WBC: 8.5 10*3/uL (ref 4.0–10.5)

## 2017-11-23 LAB — GLUCOSE, CAPILLARY
Glucose-Capillary: 186 mg/dL — ABNORMAL HIGH (ref 65–99)
Glucose-Capillary: 259 mg/dL — ABNORMAL HIGH (ref 65–99)

## 2017-11-23 MED ORDER — CLOPIDOGREL BISULFATE 75 MG PO TABS: 75.0000 mg | ORAL_TABLET | Freq: Every day | ORAL | 3 refills | Status: DC

## 2017-11-23 MED ORDER — LINAGLIPTIN 5 MG PO TABS: 5.0000 mg | ORAL_TABLET | Freq: Every day | ORAL | Status: AC

## 2017-11-23 MED ORDER — ASPIRIN 81 MG PO TBEC: 81.0000 mg | DELAYED_RELEASE_TABLET | Freq: Every day | ORAL | Status: DC

## 2017-11-23 MED ORDER — METOPROLOL TARTRATE 25 MG PO TABS: 12.5000 mg | ORAL_TABLET | Freq: Every day | ORAL | Status: DC

## 2017-11-23 NOTE — Progress Notes (Addendum)
Patient will discharge to [facility] Anticipated discharge date: 11/23/17 Family notified: Ola, sister and Steward Drone, daughter Transportation by: [facility]  Nurse to call report to [phone]. Patient will go to room 3217 at the facility.   CSW signing off.  Abigail Butts, LCSWA  Clinical Social Worker

## 2017-11-23 NOTE — Care Management (Signed)
CM received call from [facility] Representative for Authorization for Skilled Nursing Facility. Confirmation # [id number] for [facility] and [facility]. Family has chosen [facility] for SNF. Gala Lewandowsky, RN,BSN [phone]

## 2017-11-23 NOTE — Clinical Social Work Placement (Signed)
   CLINICAL SOCIAL WORK PLACEMENT  NOTE  Date:  11/23/2017  Patient Details  Name: Jerry Benson MRN: [id number] Date of Birth: 1929/02/25  Clinical Social Work is seeking post-discharge placement for this patient at the Skilled  Nursing Facility level of care (*CSW will initial, date and re-position this form in  chart as items are completed):  Yes   Patient/family provided with [place] Clinical Social Work Department's list of facilities offering this level of care within the geographic area requested by the patient (or if unable, by the patient's family).  Yes   Patient/family informed of their freedom to choose among providers that offer the needed level of care, that participate in Medicare, Medicaid or managed care program needed by the patient, have an available bed and are willing to accept the patient.  Yes   Patient/family informed of [facility]'s ownership interest in [facility] and [facility], as well as of the fact that they are under no obligation to receive care at these facilities.  PASRR submitted to EDS on       PASRR number received on       Existing PASRR number confirmed on 11/22/17     FL2 transmitted to all facilities in geographic area requested by pt/family on 11/22/17     FL2 transmitted to all facilities within larger geographic area on       Patient informed that his/her managed care company has contracts with or will negotiate with certain facilities, including the following:  [facility]     Yes   Patient/family informed of bed offers received.  Patient chooses bed at [facility]     Physician recommends and patient chooses bed at      Patient to be transferred to [facility] on 11/23/17.  Patient to be transferred to facility by [facility]     Patient family notified on 11/23/17 of transfer.  Name of family member notified:  Ola, sister     PHYSICIAN       Additional Comment:     _______________________________________________ Abigail Butts, LCSW 11/23/2017, 10:20 AM

## 2017-11-23 NOTE — Care Management Important Message (Signed)
Important Message  Patient Details  Name: Jerry Benson MRN: [id number] Date of Birth: 1929-03-14   Medicare Important Message Given:  Yes    Donyea Gafford P Jaydn Fincher 11/23/2017, 3:29 PM

## 2017-11-23 NOTE — Progress Notes (Signed)
The RN gave report to Nia at [facility]; Will give report to [facility] upon arrival.   Sheppard Evens RN

## 2017-11-23 NOTE — Progress Notes (Signed)
Patient's family has chosen [facility] SNF. Sister, Verdon Cummins will complete admissions paperwork at the facility at 1 pm today. SNF to initiate [facility] authorization; patient will need auth before admitting to facility. CSW to support with discharge.  Abigail Butts, LCSWA [phone]

## 2017-11-23 NOTE — Discharge Summary (Signed)
[facility]  Physician Discharge Summary   Patient ID: Jerry Benson MRN: [id number] DOB/AGE: 08-26-1928 84 y.o.  Admit date: 11/18/2017 Discharge date: 11/23/2017  PCP: Marden Noble, MD  DISCHARGE DIAGNOSES:  Non-ST elevation MI Hematoma in the left gluteus muscle secondary to fall Acute blood loss anemia  RECOMMENDATIONS FOR OUTPATIENT FOLLOW UP: 1. Check CBC and basic metabolic panel on a weekly basis starting May 23 2. Resume Plavix on May 23 if hemoglobin remains stable.  DISCHARGE CONDITION: fair  Diet recommendation: Modified carbohydrate  Filed Weights   11/21/17 0450 11/22/17 0447 11/23/17 0442  Weight: 55.5 kg (122 lb 5.7 oz) 57.5 kg (126 lb 12.2 oz) 58 kg (127 lb 13.9 oz)    INITIAL HISTORY: 84 year old Caucasian male with a past medical history of dementia, coronary artery disease, chronic kidney disease stage III, diabetes mellitus type 2 presented from a skilled nursing facility with hyperglycemia. There was some concern for acute MI based on EKG findings. The patient was seen by cardiology. He was hospitalized for further management.  Consultants:Cardiology  Procedures:Blood transfusion   HOSPITAL COURSE:   Acute blood loss anemia Patient's hemoglobin noted to drop to 6.9 on the morning of 5/19.  No overt bleeding has been noted. Due to his recent fall and bruising in the left side CT scan of the abdomen pelvis was done. This did reveal a hematoma in the left gluteus muscle. ?No pelvic fractures noted. ?The fractures were appreciated. ?No active extravasation was seen. ?He was on IV heparin for MI which was stopped. ?He is also noted to be on aspirin and Plavix. ?Plavix was held.  He was transfused 2 units of blood.  Hemoglobin has responded appropriately and remains stable.  Plavix can be resumed on May 23 after ensuring hemoglobin is stable.  Hematoma in Left gluteus muscle  This is most likely result of his recent fall. He was transfused 2  units of blood.  No pelvic fractures noted on CT scan.    Left-sided rib fractures Secondary to fall.  Stable.  Saturating okay.  No significant pain issues.  Non-ST elevation MI in the setting of known coronary artery disease Patient seen by cardiology. ?Due to his age and dementia he is not considered a candidate for any further work-up. ?He was placed on heparin intravenously which was discontinued due to his drop in hemoglobin. ?He was placed on aspirin and was already on Plavix. Plavix  can be resumed on May 23 if hemoglobin remains stable.  Continue with beta-blocker and statin.  Monitor blood pressures.  Diabetes mellitus type 2 with hyperglycemia Blood glucose levels initially in 500s. ?He was initially on insulin infusion. Then transitioned to linagliptin. ?During his previous hospitalization recently he had episodes of hypoglycemia and so was discharged home without any insulin. ?HbA1c 6.8 on May 7. ?In this elderly gentleman we would not strive for strict glycemic control. ?  History of dementia Apparently he lives at home with his elderly wife and mentally challenged daughter. PT and OT consulted.  It is felt that the patient will benefit from short-term rehab and skilled nursing facility.  Continue Aricept.  Overall stable.  Okay for discharge to skilled nursing facility for short-term rehab.    PERTINENT LABS:  The results of significant diagnostics from this hospitalization (including imaging, microbiology, ancillary and laboratory) are listed below for reference.     Labs: Basic Metabolic Panel: Recent Labs  Lab 11/19/17 0217 11/19/17 1132 11/21/17 0435 11/22/17 0451  NA 136 138 136  138  K 4.3 4.1 4.1 4.1  CL 103 108 107 107  CO2 21* 22 21* 21*  GLUCOSE 454* 174* 149* 110*  BUN 39* 30* 23* 21*  CREATININE 1.52* 1.29* 1.18 1.16  CALCIUM 9.1 9.1 8.2* 8.9   CBC: Recent Labs  Lab 11/19/17 0217 11/20/17 1133 11/21/17 0435 11/22/17 0451 11/23/17 0433  WBC  11.8* 8.3 7.3 8.2 8.5  HGB 11.0* 8.3* 6.9* 10.7* 11.3*  HCT 33.3* 25.5* 21.0* 31.4* 33.8*  MCV 89.0 88.9 90.1 86.3 87.1  PLT 141* 137* 115* 137* 154   Cardiac Enzymes: Recent Labs  Lab 11/19/17 0217  TROPONINI 0.53*   CBG: Recent Labs  Lab 11/22/17 0743 11/22/17 1219 11/22/17 1625 11/22/17 2022 11/23/17 0740  GLUCAP 128* 211* 201* 194* 186*     IMAGING STUDIES Ct Abdomen Pelvis Wo Contrast  Result Date: 11/21/2017 CLINICAL DATA:  LEFT-sided pain after fall EXAM: CT ABDOMEN AND PELVIS WITHOUT CONTRAST TECHNIQUE: Multidetector CT imaging of the abdomen and pelvis was performed following the standard protocol without IV contrast. COMPARISON:  None. FINDINGS: Lower chest: Lung bases are clear. Hepatobiliary: No focal hepatic lesion. Postcholecystectomy. No biliary dilatation. Pancreas: Pancreas is normal. No ductal dilatation. No pancreatic inflammation. Spleen: Normal spleen Adrenals/urinary tract: Adrenal glands and kidneys are normal. The ureters and bladder normal. Stomach/Bowel: Stomach, small bowel, appendix, and cecum are normal. The colon and rectosigmoid colon are normal. Vascular/Lymphatic: Abdominal aorta is normal caliber with atherosclerotic calcification. There is no retroperitoneal or periportal lymphadenopathy. No pelvic lymphadenopathy. Reproductive: Prostate normal Other: No free fluid. Musculoskeletal: Is hematoma within the LEFT gluteus muscle inferior to the ischium measuring 9.8 x 4.7 cm (image 91/3. The entirety of this hematoma is not imaged on the axial imaging however on the coronal imaging the inferior margin of the hematoma appears to be included (image 63/6). no evidence of active extravasation. No pelvic fracture or hip fracture identified. Minimally displaced fractures of the posterior tenth eleventh and twelfth ribs (image 11 and 19 of series 4 and image 27 series 3. A transverse process fracture on the LEFT at L3 is partially healed. IMPRESSION: 1. Hematoma  within the LEFT gluteus muscle inferior to the ischium. No evidence of active extravasation. 2. No pelvic fracture 3. Minimally displaced fractures of the LEFT tenth, eleventh, and twelfth ribs. Electronically Signed   By: Genevive Bi M.D.   On: 11/21/2017 09:50   Dg Ribs Unilateral W/chest Left  Result Date: 11/08/2017 CLINICAL DATA:  Pain and bruising over the left ribs. EXAM: LEFT RIBS AND CHEST - 3+ VIEW COMPARISON:  CXR 09/18/2011 FINDINGS: Acute anterolateral angulated fracture of the left tenth rib an equivocal nondisplaced fracture of the posterior left third rib. Heart is enlarged but stable in appearance. Aortic atherosclerosis without aneurysm. Post CABG change is again noted. Lungs are clear without pulmonary consolidation. No effusion or pneumothorax. Cholecystectomy clips are present the right upper quadrant. Tubing projects over the periphery of the left thorax. IMPRESSION: Acute angulated left anterolateral tenth rib fracture with equivocal posterior eleventh rib fracture. No acute pulmonary abnormality. Stable cardiomegaly with aortic atherosclerosis and post CABG change. Electronically Signed   By: Tollie Eth M.D.   On: 11/08/2017 20:30   Dg Lumbar Spine 2-3 Views  Result Date: 11/09/2017 CLINICAL DATA:  84 year old male with fall. EXAM: LUMBAR SPINE - 2-3 VIEW COMPARISON:  None. FINDINGS: There is no acute fracture or subluxation of the lumbar spine. There is osteopenia with multilevel degenerative changes. L4-L5 and L5-S1 disc space narrowing  noted. There is mild chronic appearing L3 compression with mild anterior wedging. Multilevel facet hypertrophy. There is atherosclerotic calcification of the abdominal aorta. The soft tissues are grossly unremarkable. IMPRESSION: No acute/traumatic osseous pathology. Electronically Signed   By: Elgie Collard M.D.   On: 11/09/2017 01:56   Dg Pelvis 1-2 Views  Result Date: 11/09/2017 CLINICAL DATA:  84 y/o M; multiple falls over the past  week. Pain down the left side of the body. EXAM: PELVIS - 1-2 VIEW COMPARISON:  None. FINDINGS: No acute fracture or pelvic diastases identified. Surgical clips project over the left groin. Advanced degenerative changes of visible lower lumbar spine. Mild osteoarthrosis of hip joints with periarticular osteophytosis. Extensive vascular calcification. IMPRESSION: No acute fracture or dislocation identified. Electronically Signed   By: Mitzi Hansen M.D.   On: 11/09/2017 01:55   Dg Sacrum/coccyx  Result Date: 11/19/2017 CLINICAL DATA:  Acute sacral/coccygeal pain. EXAM: SACRUM AND COCCYX - 2+ VIEW COMPARISON:  11/09/2017 radiographs FINDINGS: There is no evidence of fracture or other focal bone lesions. Degenerative changes in the LOWER lumbar spine again noted. IMPRESSION: No acute abnormality Degenerative changes in the LOWER lumbar spine. Electronically Signed   By: Harmon Pier M.D.   On: 11/19/2017 21:21   Ct Head Wo Contrast  Result Date: 11/08/2017 CLINICAL DATA:  Hypoglycemia. Altered level of consciousness. Reported falls over the past week. EXAM: CT HEAD WITHOUT CONTRAST TECHNIQUE: Contiguous axial images were obtained from the base of the skull through the vertex without intravenous contrast. COMPARISON:  04/15/2016 MRI FINDINGS: BRAIN: There is mild sulcal and ventricular prominence consistent with superficial and central atrophy. No intraparenchymal hemorrhage, mass effect nor midline shift. Periventricular and subcortical white matter hypodensities consistent with chronic small vessel ischemic disease are identified. No acute large vascular territory infarcts. No abnormal extra-axial fluid collections. Basal cisterns are not effaced and midline. VASCULAR: Moderate calcific atherosclerosis of the carotid siphons. SKULL: No skull fracture. No significant scalp soft tissue swelling. SINUSES/ORBITS: The mastoid air-cells are clear. The included paranasal sinuses are well-aerated.The included  ocular globes and orbital contents are non-suspicious. Bilateral lens replacements noted. OTHER: None. IMPRESSION: Atrophy with chronic small vessel ischemia. No acute intracranial abnormality. Electronically Signed   By: Tollie Eth M.D.   On: 11/08/2017 21:14   Dg Chest Port 1 View  Result Date: 11/19/2017 CLINICAL DATA:  Acute onset of left flank bruising and generalized weakness. Hypoglycemia. EXAM: PORTABLE CHEST 1 VIEW COMPARISON:  Chest radiograph performed 11/09/2017 FINDINGS: The lungs are well-aerated and clear. There is no evidence of focal opacification, pleural effusion or pneumothorax. The cardiomediastinal silhouette is normal in size. The patient is status post median sternotomy, with evidence of prior CABG. No acute osseous abnormalities are seen. IMPRESSION: No acute cardiopulmonary process seen. Electronically Signed   By: Roanna Raider M.D.   On: 11/19/2017 06:07   Dg Chest Port 1 View  Result Date: 11/09/2017 CLINICAL DATA:  Generalized weakness and increased falls over the past week with bruising of the left ribs. The patient reports left-sided chest discomfort. EXAM: PORTABLE CHEST 1 VIEW COMPARISON:  Chest and left rib series of Nov 08, 2017 FINDINGS: The lungs are well-expanded. There is no pneumothorax or pleural effusion. There is no evidence of a pulmonary contusion. The heart is top-normal in size. There are post CABG changes. There is no pulmonary vascular congestion. The bony thorax exhibits no acute abnormality. The known left tenth rib fracture is partially obscured on today's study by an overlying heart monitoring  lead. IMPRESSION: Stable appearance of the chest. No pneumonia nor pulmonary edema. Mild cardiomegaly. A known anterior tenth rib fracture is faintly visible but partially obscured. Electronically Signed   By: David  [place] M.D.   On: 11/09/2017 07:44    DISCHARGE EXAMINATION: Vitals:   11/23/17 0019 11/23/17 0442 11/23/17 0546 11/23/17 0822  BP: 127/77 (!)  88/52 128/67 110/68  Pulse:  67  62  Resp: (!) 28 (!) 26 15   Temp:  98.5 F (36.9 C)    TempSrc:  Oral    SpO2:  97%    Weight:  58 kg (127 lb 13.9 oz)    Height:       General appearance: alert, cooperative, distracted and no distress Resp: clear to auscultation bilaterally Cardio: regular rate and rhythm, S1, S2 normal, no murmur, click, rub or gallop GI: soft, non-tender; bowel sounds normal; no masses,  no organomegaly  DISPOSITION: Skilled nursing facility  Discharge Instructions    Call MD for:  difficulty breathing, headache or visual disturbances   Complete by:  As directed    Call MD for:  extreme fatigue   Complete by:  As directed    Call MD for:  persistant dizziness or light-headedness   Complete by:  As directed    Call MD for:  persistant nausea and vomiting   Complete by:  As directed    Call MD for:  severe uncontrolled pain   Complete by:  As directed    Call MD for:  temperature >100.4   Complete by:  As directed    Discharge instructions   Complete by:  As directed    Please review instructions on the discharge summary.  You were cared for by a hospitalist during your hospital stay. If you have any questions about your discharge medications or the care you received while you were in the hospital after you are discharged, you can call the unit and asked to speak with the hospitalist on call if the hospitalist that took care of you is not available. Once you are discharged, your primary care physician will handle any further medical issues. Please note that NO REFILLS for any discharge medications will be authorized once you are discharged, as it is imperative that you return to your primary care physician (or establish a relationship with a primary care physician if you do not have one) for your aftercare needs so that they can reassess your need for medications and monitor your lab values. If you do not have a primary care physician, you can call [phone] for a  physician referral.   Increase activity slowly   Complete by:  As directed         Allergies as of 11/23/2017      Reactions   Amlodipine Other (See Comments)   headache   Lipitor [atorvastatin Calcium] Other (See Comments)   Unknown reaction   Lisinopril Cough   Rash / cough. However patient is currently taking Lotensin with no problems      Medication List    TAKE these medications   aspirin 81 MG EC tablet Take 1 tablet (81 mg total) by mouth daily.   clopidogrel 75 MG tablet Commonly known as:  PLAVIX Take 1 tablet (75 mg total) by mouth daily. RESUME ON 11/25/17. Start taking on:  11/25/2017 What changed:    additional instructions  These instructions start on 11/25/2017. If you are unsure what to do until then, ask your doctor or other care provider.  donepezil 5 MG tablet Commonly known as:  ARICEPT Take 1 tablet (5 mg total) by mouth at bedtime.   finasteride 5 MG tablet Commonly known as:  PROSCAR Take 5 mg by mouth daily.   geriatric multivitamins-minerals Liqd Take 15 mLs by mouth 2 (two) times daily.   lidocaine 5 % Commonly known as:  LIDODERM Place 1 patch onto the skin daily. Remove & Discard patch within 12 hours or as directed by MD   linagliptin 5 MG Tabs tablet Commonly known as:  TRADJENTA Take 1 tablet (5 mg total) by mouth daily.   megestrol 400 MG/10ML suspension Commonly known as:  MEGACE Take 10 mLs (400 mg total) by mouth daily.   metoprolol tartrate 25 MG tablet Commonly known as:  LOPRESSOR Take 0.5 tablets (12.5 mg total) by mouth daily.   nitroGLYCERIN 0.4 MG SL tablet Commonly known as:  NITROSTAT Place one (1) tablet under the tongue every 5 minutes as needed for chest pain max 3 doses in 15 before calling 911.   NOVOLOG 100 UNIT/ML injection Generic drug:  insulin aspart Inject 0-12 Units into the skin at bedtime. SSI: <200 = 0 unit, 201-300= 5 units, 251-300 = 3 units, 301-400 = 8 units, greater than 400 =12 units    call provider   NUTRITIONAL SUPPLEMENT Liqd Take 120 mLs by mouth 2 (two) times daily. MedPass   rosuvastatin 10 MG tablet Commonly known as:  CRESTOR Take 0.5 tablets (5 mg total) by mouth daily.   sertraline 50 MG tablet Commonly known as:  ZOLOFT Take 50 mg by mouth daily.         Contact information for follow-up providers    Marden Noble, MD. Schedule an appointment as soon as possible for a visit in 2 week(s).   Specialty:  Internal Medicine Contact information: [address] [place] [address] [phone]        Corky Crafts, MD .   Specialties:  Cardiology, Radiology, Interventional Cardiology Contact information: [address] [place] [address] [phone]            Contact information for after-discharge care    Destination    [facility] .   Service:  Skilled Nursing Contact information: [address] [place] [address] [phone]                  TOTAL DISCHARGE TIME: 35 minutes  Osvaldo Shipper  [facility] Pager [phone]  11/23/2017, 10:10 AM

## 2017-12-18 ENCOUNTER — Emergency Department (HOSPITAL_COMMUNITY): Payer: Medicare Other

## 2017-12-18 ENCOUNTER — Emergency Department (HOSPITAL_COMMUNITY)
Admission: EM | Admit: 2017-12-18 | Discharge: 2017-12-20 | Disposition: A | Discharge status: Skilled Nursing Facility | Payer: Medicare Other | Attending: Emergency Medicine | Admitting: Emergency Medicine

## 2017-12-18 ENCOUNTER — Other Ambulatory Visit: Payer: Self-pay

## 2017-12-18 ENCOUNTER — Encounter (HOSPITAL_COMMUNITY): Payer: Self-pay

## 2017-12-18 DIAGNOSIS — N183 Chronic kidney disease, stage 3 (moderate): Secondary | ICD-10-CM | POA: Diagnosis not present

## 2017-12-18 DIAGNOSIS — Z951 Presence of aortocoronary bypass graft: Secondary | ICD-10-CM | POA: Diagnosis not present

## 2017-12-18 DIAGNOSIS — I252 Old myocardial infarction: Secondary | ICD-10-CM | POA: Diagnosis not present

## 2017-12-18 DIAGNOSIS — L03114 Cellulitis of left upper limb: Secondary | ICD-10-CM | POA: Insufficient documentation

## 2017-12-18 DIAGNOSIS — Z79899 Other long term (current) drug therapy: Secondary | ICD-10-CM | POA: Insufficient documentation

## 2017-12-18 DIAGNOSIS — Z7982 Long term (current) use of aspirin: Secondary | ICD-10-CM | POA: Diagnosis not present

## 2017-12-18 DIAGNOSIS — E1122 Type 2 diabetes mellitus with diabetic chronic kidney disease: Secondary | ICD-10-CM | POA: Insufficient documentation

## 2017-12-18 DIAGNOSIS — Z8673 Personal history of transient ischemic attack (TIA), and cerebral infarction without residual deficits: Secondary | ICD-10-CM | POA: Insufficient documentation

## 2017-12-18 DIAGNOSIS — R296 Repeated falls: Secondary | ICD-10-CM

## 2017-12-18 DIAGNOSIS — I509 Heart failure, unspecified: Secondary | ICD-10-CM | POA: Insufficient documentation

## 2017-12-18 DIAGNOSIS — I130 Hypertensive heart and chronic kidney disease with heart failure and stage 1 through stage 4 chronic kidney disease, or unspecified chronic kidney disease: Secondary | ICD-10-CM | POA: Insufficient documentation

## 2017-12-18 DIAGNOSIS — I2510 Atherosclerotic heart disease of native coronary artery without angina pectoris: Secondary | ICD-10-CM | POA: Diagnosis not present

## 2017-12-18 LAB — CK: CK TOTAL: 43 U/L — AB (ref 49–397)

## 2017-12-18 LAB — CBC WITH DIFFERENTIAL/PLATELET
Basophils Absolute: 0 10*3/uL (ref 0.0–0.1)
Basophils Relative: 0 %
EOS ABS: 0.1 10*3/uL (ref 0.0–0.7)
EOS PCT: 1 %
HCT: 40 % (ref 39.0–52.0)
Hemoglobin: 13.8 g/dL (ref 13.0–17.0)
LYMPHS ABS: 1.3 10*3/uL (ref 0.7–4.0)
Lymphocytes Relative: 11 %
MCH: 31 pg (ref 26.0–34.0)
MCHC: 34.5 g/dL (ref 30.0–36.0)
MCV: 89.9 fL (ref 78.0–100.0)
MONO ABS: 0.7 10*3/uL (ref 0.1–1.0)
MONOS PCT: 6 %
Neutro Abs: 10.1 10*3/uL — ABNORMAL HIGH (ref 1.7–7.7)
Neutrophils Relative %: 82 %
PLATELETS: 151 10*3/uL (ref 150–400)
RBC: 4.45 MIL/uL (ref 4.22–5.81)
RDW: 14.4 % (ref 11.5–15.5)
WBC: 12.2 10*3/uL — AB (ref 4.0–10.5)

## 2017-12-18 LAB — COMPREHENSIVE METABOLIC PANEL
ALT: 65 U/L — ABNORMAL HIGH (ref 17–63)
ANION GAP: 8 (ref 5–15)
AST: 34 U/L (ref 15–41)
Albumin: 4.1 g/dL (ref 3.5–5.0)
Alkaline Phosphatase: 94 U/L (ref 38–126)
BUN: 34 mg/dL — ABNORMAL HIGH (ref 6–20)
CHLORIDE: 107 mmol/L (ref 101–111)
CO2: 23 mmol/L (ref 22–32)
Calcium: 9.8 mg/dL (ref 8.9–10.3)
Creatinine, Ser: 0.9 mg/dL (ref 0.61–1.24)
Glucose, Bld: 157 mg/dL — ABNORMAL HIGH (ref 65–99)
POTASSIUM: 4.5 mmol/L (ref 3.5–5.1)
SODIUM: 138 mmol/L (ref 135–145)
Total Bilirubin: 1 mg/dL (ref 0.3–1.2)
Total Protein: 7.9 g/dL (ref 6.5–8.1)

## 2017-12-18 LAB — URINALYSIS, ROUTINE W REFLEX MICROSCOPIC
BILIRUBIN URINE: NEGATIVE
Glucose, UA: NEGATIVE mg/dL
Hgb urine dipstick: NEGATIVE
Ketones, ur: NEGATIVE mg/dL
Leukocytes, UA: NEGATIVE
NITRITE: NEGATIVE
PROTEIN: NEGATIVE mg/dL
Specific Gravity, Urine: 1.01 (ref 1.005–1.030)
pH: 5 (ref 5.0–8.0)

## 2017-12-18 LAB — I-STAT CHEM 8, ED
BUN: 32 mg/dL — AB (ref 6–20)
CREATININE: 0.8 mg/dL (ref 0.61–1.24)
Calcium, Ion: 1.35 mmol/L (ref 1.15–1.40)
Chloride: 105 mmol/L (ref 101–111)
Glucose, Bld: 151 mg/dL — ABNORMAL HIGH (ref 65–99)
HEMATOCRIT: 40 % (ref 39.0–52.0)
Hemoglobin: 13.6 g/dL (ref 13.0–17.0)
POTASSIUM: 4.5 mmol/L (ref 3.5–5.1)
SODIUM: 138 mmol/L (ref 135–145)
TCO2: 21 mmol/L — ABNORMAL LOW (ref 22–32)

## 2017-12-18 LAB — I-STAT TROPONIN, ED: Troponin i, poc: 0.07 ng/mL (ref 0.00–0.08)

## 2017-12-18 MED ORDER — ROSUVASTATIN CALCIUM 5 MG PO TABS
5.0000 mg | ORAL_TABLET | Freq: Every day | ORAL | Status: DC
  Administered 2017-12-19 (×2): 5 mg via ORAL
  Filled 2017-12-18 (×3): qty 1

## 2017-12-18 MED ORDER — ASPIRIN EC 81 MG PO TBEC
81.0000 mg | DELAYED_RELEASE_TABLET | Freq: Every day | ORAL | Status: DC
  Administered 2017-12-19 – 2017-12-20 (×2): 81 mg via ORAL
  Filled 2017-12-18 (×2): qty 1

## 2017-12-18 MED ORDER — TRAZODONE HCL 50 MG PO TABS
50.0000 mg | ORAL_TABLET | Freq: Every day | ORAL | Status: DC
Start: 2017-12-18 — End: 2017-12-20
  Administered 2017-12-19 (×2): 50 mg via ORAL
  Filled 2017-12-18 (×2): qty 1

## 2017-12-18 MED ORDER — TAMSULOSIN HCL 0.4 MG PO CAPS
0.4000 mg | ORAL_CAPSULE | Freq: Every day | ORAL | Status: DC
  Administered 2017-12-19: 0.4 mg via ORAL
  Filled 2017-12-18 (×2): qty 1

## 2017-12-18 MED ORDER — METOPROLOL SUCCINATE ER 25 MG PO TB24
25.0000 mg | ORAL_TABLET | Freq: Every day | ORAL | Status: DC
  Administered 2017-12-19 – 2017-12-20 (×3): 25 mg via ORAL
  Filled 2017-12-18 (×3): qty 1

## 2017-12-18 MED ORDER — DONEPEZIL HCL 5 MG PO TABS
5.0000 mg | ORAL_TABLET | Freq: Every day | ORAL | Status: DC
  Administered 2017-12-19 (×2): 5 mg via ORAL
  Filled 2017-12-18 (×2): qty 1

## 2017-12-18 MED ORDER — LINAGLIPTIN 5 MG PO TABS
5.0000 mg | ORAL_TABLET | Freq: Every day | ORAL | Status: DC
  Administered 2017-12-19 – 2017-12-20 (×2): 5 mg via ORAL
  Filled 2017-12-18 (×3): qty 1

## 2017-12-18 NOTE — ED Notes (Signed)
Allevyn Life applied to sacrum, Stage II.  Pt presents with redness to perineum and sacral area.

## 2017-12-18 NOTE — ED Notes (Signed)
Bed: WA30 Expected date:  Expected time:  Means of arrival:  Comments: Room 9  

## 2017-12-18 NOTE — ED Notes (Signed)
Spoke with Dr. Read DriversMolpus r/t metoprolol ordered QD, pt's systolic b/p currently & unable to verify with family if pt took PTA.  Per Dr. Read DriversMolpus, go ahead & give.

## 2017-12-18 NOTE — ED Triage Notes (Signed)
Pt comes from home. Pt is AOx4 and ambulatory with 4-point walker. Pt family called 911 to have EMS pick patient up and transfer to ED for physical evaluation due to multiple falls per family. However per EMS, upon assessment of pt and living area, there may be some neglect due to being unable to care for pt as well as wife who also lives with grandchildren. Pt has stains and smells of urine. The fall was 28 days ago and has returned from rehab. ED provider has been made aware.

## 2017-12-18 NOTE — Progress Notes (Addendum)
CSW spoke to pt who said he is not being neglected and not being exploited, or refused food or water from or by family members and that pt just is "not able to walk right now" but that the pt could walk previously to his stay at [facility].  CSW spoke to pt's grandson [name] who stated pt was at [facility] until 6/14, returned home and then was unable to walk as the pt had been able to before.  Per pt's grandson [name] pt had a fall at home after D/C and no one was there to assist the pt in standing up and pt remained on the floor for some time before help arrived.  CSW met with the provider who stated pt was medically cleared for D/C and that there were no acute, rehab-able injuries that would qualify for SNF PT/OT placement.   Per chart, pt has [facility] Medicare which would require a pre-authorization process that would not be able to begin until Monday 6/17 when [facility] office's opens.  3:55 PM CSW called the CSW Asst Director who pointed out that the pt was at [facility] from 5/15 to 6/14 and as such pt has no remaining days with [facility] and as such pt would have to private pay starting today.  CSW reviewed chart and pt has a PASRR: PASRR Number: [id number].  Pt gave CSW verbal permission to create an FL-2 and send referrals out to [facility] and [facility] and will private pay at one or the other on Monday.  CSW will continue to follow for D/C needs.  Alphonse Guild. Mikenna Bunkley, LCSW, LCAS, CSI Clinical Social Worker Ph: [phone]

## 2017-12-18 NOTE — ED Notes (Signed)
Pt. Came in with a saturated brief with soiled clothes. Pt. Was cleaned up. Nurses aware.

## 2017-12-18 NOTE — ED Notes (Signed)
Bed: WA09 Expected date:  Expected time:  Means of arrival:  Comments: 84 yo frequent falls, family wants eval

## 2017-12-18 NOTE — NC FL2 (Addendum)
[place] MEDICAID FL2 LEVEL OF CARE SCREENING TOOL     IDENTIFICATION  Patient Name: Jerry Benson Birthdate: 02/10/1929 Sex: male Admission Date (Current Location): 12/18/2017  [facility] [place]   Facility and Address:  [facility],  [address], [place] [id number]      Provider Number: [id number]  Attending Physician Name and Address:  Melene PlanFloyd, Dan, DO  Relative Name and Phone Number:       Current Level of Care: Hospital Recommended Level of Care: Skilled Nursing Facility Prior Approval Number:    Date Approved/Denied:   PASRR Number: PASRR Number:  Discharge Plan: SNF    Current Diagnoses: Patient Active Problem List   Diagnosis Date Noted  . NSTEMI (non-ST elevated myocardial infarction) (HCC) 11/19/2017  . Hyperglycemia 11/19/2017  . Pressure injury of skin 11/09/2017  . Hypoglycemia 11/08/2017  . Fall at home, initial encounter 11/08/2017  . Closed rib fracture 11/08/2017  . Word finding difficulty 04/14/2016  . Acute encephalopathy 04/14/2016  . Expressive aphasia 04/14/2016  . Hypertension 04/14/2016  . TIA (transient ischemic attack) 07/11/2015  . Memory disorder 05/28/2014  . Postherpetic neuralgia 09/25/2013  . Intertriginous candidiasis 01/12/2013  . Iron deficiency anemia 09/15/2012  . Malaise and fatigue 11/16/2011  . Unstable angina pectoris (HCC) 09/19/2011  . CKD (chronic kidney disease) stage 3, GFR 30-59 ml/min (HCC) 09/19/2011  . CAD (coronary artery disease) 01/21/2011  . Type 2 diabetes mellitus with vascular disease (HCC) 10/17/2010  . Hypercholesterolemia 10/17/2010  . Hx of CABG 10/17/2010  . Benign hypertensive heart disease without heart failure 10/17/2010  . Dyspepsia 10/17/2010    Orientation RESPIRATION BLADDER Height & Weight     Self, Time, Situation, Place  Normal Incontinent Weight: 127 lb (57.6 kg) Height:  5\' 7"  (170.2 cm)  BEHAVIORAL SYMPTOMS/MOOD NEUROLOGICAL BOWEL NUTRITION STATUS       Incontinent Diet(Heart Healthy)  AMBULATORY STATUS COMMUNICATION OF NEEDS Skin   Extensive Assist Verbally (Stage !! Sacral Pressure Sore)                       Personal Care Assistance Level of Assistance  Bathing, Dressing Bathing Assistance: Maximum assistance   Dressing Assistance: Maximum assistance     Functional Limitations Info             SPECIAL CARE FACTORS FREQUENCY  PT (By licensed PT), OT (By licensed OT)     PT Frequency: 5 OT Frequency: 5            Contractures Contractures Info: Not present    Additional Factors Info  Code Status, Allergies Code Status Info: Prior Allergies Info: Amlodipine, Lipitor Atorvastatin Calcium, Lisinopril           Current Medications (12/18/2017):  This is the current hospital active medication list No current facility-administered medications for this encounter.    Current Outpatient Medications  Medication Sig Dispense Refill  . aspirin EC 81 MG EC tablet Take 1 tablet (81 mg total) by mouth daily.    . clopidogrel (PLAVIX) 75 MG tablet Take 1 tablet (75 mg total) by mouth daily. RESUME ON 11/25/17. 90 tablet 3  . donepezil (ARICEPT) 5 MG tablet Take 1 tablet (5 mg total) by mouth at bedtime. 30 tablet 0  . finasteride (PROSCAR) 5 MG tablet Take 5 mg by mouth daily.     Marland Kitchen. geriatric multivitamins-minerals (ELDERTONIC/GEVRABON) LIQD Take 15 mLs by mouth 2 (two) times daily.    .Marland Kitchen  insulin aspart (NOVOLOG) 100 UNIT/ML injection Inject 0-12 Units into the skin at bedtime. SSI: <200 = 0 unit, 201-300= 5 units, 251-300 = 3 units, 301-400 = 8 units, greater than 400 =12 units  call provider    . lidocaine (LIDODERM) 5 % Place 1 patch onto the skin daily. Remove & Discard patch within 12 hours or as directed by MD 30 patch 0  . linagliptin (TRADJENTA) 5 MG TABS tablet Take 1 tablet (5 mg total) by mouth daily. 30 tablet   . metoprolol tartrate (LOPRESSOR) 25 MG tablet Take 0.5 tablets (12.5 mg total) by mouth daily.     . nitroGLYCERIN (NITROSTAT) 0.4 MG SL tablet Place one (1) tablet under the tongue every 5 minutes as needed for chest pain max 3 doses in 15 before calling 911. 25 tablet 3  . NUTRITIONAL SUPPLEMENT LIQD Take 120 mLs by mouth 2 (two) times daily. MedPass    . rosuvastatin (CRESTOR) 10 MG tablet Take 0.5 tablets (5 mg total) by mouth daily. 45 tablet 3  . sertraline (ZOLOFT) 50 MG tablet Take 50 mg by mouth daily.        Discharge Medications: Please see discharge summary for a list of discharge medications.  Relevant Imaging Results:  Relevant Lab Results:   Additional Information [id number]  Dorothe Pea Kalman Nylen, LCSWA
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
## 2017-12-19 ENCOUNTER — Emergency Department (HOSPITAL_COMMUNITY): Payer: Medicare Other

## 2017-12-19 MED ORDER — DOXYCYCLINE HYCLATE 100 MG PO TABS
100.0000 mg | ORAL_TABLET | Freq: Two times a day (BID) | ORAL | Status: DC
  Administered 2017-12-19 – 2017-12-20 (×2): 100 mg via ORAL
  Filled 2017-12-19 (×3): qty 1

## 2017-12-19 NOTE — ED Notes (Addendum)
Requested Dr. Deretha EmoryZackowski assess pt's left elbow for questionable cellulitis.

## 2017-12-19 NOTE — ED Notes (Signed)
Condom cath dislodgeed, pt incontinent of urine.

## 2017-12-19 NOTE — ED Notes (Signed)
Pt cleaned of urine, bed changed.

## 2017-12-19 NOTE — ED Notes (Signed)
Red circular area noted to left elbow measuring 8 cm x 6.5 cm area marked. Will continue to monitor.

## 2017-12-19 NOTE — ED Notes (Signed)
Pt talking, this Clinical research associatewriter in to room, condom cath is removed.  Pt stated "Who are you?  Who is she?"  Pt brief is dry; pt re-oriented to place & time.

## 2017-12-20 MED ORDER — DOXYCYCLINE HYCLATE 100 MG PO CAPS: 100.0000 mg | ORAL_CAPSULE | Freq: Two times a day (BID) | ORAL | 0 refills | Status: DC

## 2017-12-20 NOTE — ED Notes (Signed)
Pt denied any needs @ this time.

## 2017-12-20 NOTE — Progress Notes (Signed)
Report given to [facility].  

## 2017-12-20 NOTE — Evaluation (Signed)
Physical Therapy Evaluation Patient Details Name: Jerry Benson MRN: [id number] DOB: 14-Jul-1928 Today's Date: 12/20/2017   History of Present Illness  84 yo male recently in rehab, brought to ED  after fall, decreased ambulation. recent fall at [facility] resulting  L side rib fractures fx at T10, ll and 12 along with transverse process fracture L3 and hematoma on L gluteal muscle inferior to ischium.  PMHx:  CAD, CKD3, DM, HLD, stents, DJD lumbar spine,    Clinical Impression  The  Patient stood and took a few steps. Patient may return to assisted ambulation with Rw.  Patient recently in [facility]. Plans to return. Pt admitted with above diagnosis. Pt currently with functional limitations due to the deficits listed below (see PT Problem List). Pt will benefit from skilled PT to increase their independence and safety with mobility to allow discharge to the venue listed below.       Follow Up Recommendations SNF    Equipment Recommendations  None recommended by PT    Recommendations for Other Services       Precautions / Restrictions Precautions Precautions: Fall      Mobility  Bed Mobility Overal bed mobility: Needs Assistance Bed Mobility: Supine to Sit;Sit to Supine     Supine to sit: Mod assist Sit to supine: Min assist   General bed mobility comments: assist with trunk to sit up, patient  self assisted legs to sit up and back into bed  with supervision  Transfers Overall transfer level: Needs assistance   Transfers: Sit to/from Stand Sit to Stand: Mod assist;+2 safety/equipment         General transfer comment: stood at back of Chair, took 4 side steps to [facility] along the bed.  Ambulation/Gait                Stairs            Wheelchair Mobility    Modified Rankin (Stroke Patients Only)       Balance Overall balance assessment: History of Falls;Needs assistance Sitting-balance support: Feet supported;Bilateral upper extremity supported Sitting  balance-Leahy Scale: Poor   Postural control: Left lateral lean Standing balance support: During functional activity;Bilateral upper extremity supported Standing balance-Leahy Scale: Poor                               Pertinent Vitals/Pain Pain Assessment: Faces Faces Pain Scale: No hurt    Home Living Family/patient expects to be discharged to:: Skilled nursing facility                      Prior Function Level of Independence: Needs assistance   Gait / Transfers Assistance Needed: rollator in the house with family to assist           Hand Dominance        Extremity/Trunk Assessment   Upper Extremity Assessment Upper Extremity Assessment: Generalized weakness    Lower Extremity Assessment Lower Extremity Assessment: Generalized weakness    Cervical / Trunk Assessment Cervical / Trunk Assessment: Kyphotic  Communication   Communication: HOH  Cognition Arousal/Alertness: Awake/alert Behavior During Therapy: WFL for tasks assessed/performed Overall Cognitive Status: No family/caregiver present to determine baseline cognitive functioning                                 General Comments: oriented to person only  General Comments      Exercises     Assessment/Plan    PT Assessment Patient needs continued PT services  PT Problem List Decreased strength;Decreased cognition;Decreased activity tolerance;Decreased safety awareness;Decreased balance;Decreased mobility       PT Treatment Interventions DME instruction;Gait training;Functional mobility training;Therapeutic activities;Patient/family education    PT Goals (Current goals can be found in the Care Plan section)  Acute Rehab PT Goals Patient Stated Goal: none stated PT Goal Formulation: Patient unable to participate in goal setting Time For Goal Achievement: 01/03/18 Potential to Achieve Goals: Fair    Frequency Min 2X/week   Barriers to discharge Decreased  caregiver support      Co-evaluation               AM-PAC PT "6 Clicks" Daily Activity  Outcome Measure Difficulty turning over in bed (including adjusting bedclothes, sheets and blankets)?: A Lot Difficulty moving from lying on back to sitting on the side of the bed? : A Lot Difficulty sitting down on and standing up from a chair with arms (e.g., wheelchair, bedside commode, etc,.)?: Unable Help needed moving to and from a bed to chair (including a wheelchair)?: Total Help needed walking in hospital room?: Total Help needed climbing 3-5 steps with a railing? : Total 6 Click Score: 8    End of Session Equipment Utilized During Treatment: Gait belt Activity Tolerance: Patient tolerated treatment well Patient left: in bed;with nursing/sitter in room Nurse Communication: Mobility status PT Visit Diagnosis: Unsteadiness on feet (R26.81)    Time: [id number] PT Time Calculation (min) (ACUTE ONLY): 13 min   Charges:   PT Evaluation $PT Eval Low Complexity: 1 Low     PT G CodesBlanchard Kelch:       Nykira Reddix PT [phone]   Rada HayHill, Graig Hessling Elizabeth 12/20/2017, 12:37 PM

## 2017-12-20 NOTE — Progress Notes (Signed)
[facility]. Patient & family aware. [facility] called for transport. Please call report to [phone]  Stacy GardnerErin Amoria Mclees, [facility] Clinical Social Worker [phone]

## 2017-12-20 NOTE — ED Provider Notes (Signed)
Patient has facility placement at this time.  Left elbow was reexamined here and patient has slight extension outside of the markings from yesterday.  Will start patient on doxycycline.  Patient himself has no complaints today.   Lorre NickAllen, Americus Scheurich, MD 12/20/17 [phone]

## 2017-12-20 NOTE — Progress Notes (Addendum)
UPDATE 9:45AM: CSW spoke with patients grandson, Casimiro NeedleMichael [phone], who [facility]. Grandson agreeable to pay privately for SNF at this time and is able to go to facility before 12PM today to make arrangements for patient to go got facility today. CSW awaiting call from Molli HazardKelly Norris with [facility] that patient is set to go to facility.   Patient medically stable for discharge on 6/15- [facility]. [facility] and family [facility]. Per notes, patient does not have any [employer] insurance days left to pay for SNF. Patient/ family will have to pay privately. CSW having conversation with family at this time regarding private pay for SNF.   Plan: Patient to discharge today to skilled nursing facility paying privately. Facility TBD.   Stacy GardnerErin Natha Guin, [facility] Emergency Room Clinical Social Worker [phone]

## 2018-01-12 ENCOUNTER — Other Ambulatory Visit: Payer: Self-pay | Admitting: *Deleted

## 2018-01-12 ENCOUNTER — Other Ambulatory Visit: Payer: Self-pay | Admitting: Internal Medicine

## 2018-01-12 DIAGNOSIS — R296 Repeated falls: Secondary | ICD-10-CM

## 2018-01-13 ENCOUNTER — Ambulatory Visit
Admission: RE | Admit: 2018-01-13 | Discharge: 2018-01-13 | Disposition: A | Discharge status: Home / Self Care | Payer: Medicare Other | Source: Ambulatory Visit | Attending: Internal Medicine | Admitting: Internal Medicine

## 2018-01-13 DIAGNOSIS — R296 Repeated falls: Secondary | ICD-10-CM

## 2018-02-01 ENCOUNTER — Ambulatory Visit: Payer: Medicare Other | Admitting: Cardiology

## 2018-02-01 ENCOUNTER — Encounter: Payer: Self-pay | Admitting: Cardiology

## 2018-02-01 VITALS — BP 100/54 | HR 74 | Ht 66.0 in | Wt 127.0 lb

## 2018-02-01 DIAGNOSIS — I2510 Atherosclerotic heart disease of native coronary artery without angina pectoris: Secondary | ICD-10-CM

## 2018-02-01 LAB — LIPID PANEL
CHOLESTEROL TOTAL: 140 mg/dL (ref 100–199)
Chol/HDL Ratio: 4.7 ratio (ref 0.0–5.0)
HDL: 30 mg/dL — ABNORMAL LOW (ref >39)
LDL Calculated: 80 mg/dL (ref 0–99)
Triglycerides: 151 mg/dL — ABNORMAL HIGH (ref 0–149)
VLDL CHOLESTEROL CAL: 30 mg/dL (ref 5–40)

## 2018-02-01 LAB — HEPATIC FUNCTION PANEL
ALBUMIN: 3.6 g/dL (ref 3.5–4.7)
ALT: 12 IU/L (ref 0–44)
AST: 15 IU/L (ref 0–40)
Alkaline Phosphatase: 94 IU/L (ref 39–117)
Bilirubin Total: 0.5 mg/dL (ref 0.0–1.2)
Bilirubin, Direct: 0.16 mg/dL (ref 0.00–0.40)
TOTAL PROTEIN: 6.2 g/dL (ref 6.0–8.5)

## 2018-02-01 NOTE — Progress Notes (Signed)
02/01/2018 Jerry Benson   04/15/1929  [id number]  Primary Physician Marden NobleGates, Robert, MD Primary Cardiologist: Dr. Eldridge DaceVaranasi   Reason for Visit/CC: [facility] f/u; H/o CAD  HPI:  Jerry Benson is a 84 y.o. male who is being seen today for post hospital f/u. He has a hx of CABG in 1981 & redo in 2009 followed by PCI w/ DES to the SVG-OM in 2013, DM, HTN, HLD, S-CHF w/ EF 45% at cath 2010,  frequent falls, CKD III and dementia. [facility] with a fall and cardiology was consulted for the evaluation of ?NSTEMI, abnl ECG and elevated troponin at the request of Dr Julian ReilGardner, Internal Medicine. He was seen by Dr. Eden EmmsNishan. Per consult note, his ECG was concerning for transient 2 mm ST depression in precordial leads. Troponin was elevated at 0.53.  Exam with pale frail demented elderly male. He denied CP. Dr. Eden EmmsNishan reviewed prior notes from Dr. Eldridge DaceVaranasi. It appeared that Dr. Eldridge DaceVaranasi had indicated no further aggressive heart interventions or cath due to advanced age and dementia. Dr. Eden EmmsNishan agreed. He recommended IV heparin x 48 hrs and addition of a BB. He also recommended pt go to a SNF once discharged given social situation. His wife is frail and there is also a mentally retarded daughter in the household. We felt that he would appear to need SNF. He was discharged to SNF. Since being there, he has continued to have frequent falls. His wife notes that he often tries to get up to ambulate w/o calling for assistance. His last fall was 2 weeks ago. He now has a right back eye, subsequent to the fall. Luckily, he is not on anticoagulation. He had a CT of the head 01/13/18 that showed no fractures or hematoma.   He is here in clinic today with his wife and son.  From a cardiac standpoint, he has done well.  He denies any chest pain and no dyspnea.  His medication report from skilled nursing facility was reviewed.  He appears to be getting all of his cardiac medications except for Crestor.  It  appears that this was stopped due to mildly elevated LFTs.  AST was 82 and ALT was 83.  These labs were performed December 27, 2017.  Blood pressure is controlled at 100/54.  Heart Rate of 74 bpm.    Current Meds  Medication Sig  . acetaminophen (TYLENOL) 325 MG tablet Take 650 mg by mouth every 4 (four) hours as needed.  Marland Kitchen. aspirin EC 81 MG EC tablet Take 1 tablet (81 mg total) by mouth daily.  . clopidogrel (PLAVIX) 75 MG tablet Take 1 tablet (75 mg total) by mouth daily. RESUME ON 11/25/17.  Marland Kitchen. donepezil (ARICEPT) 5 MG tablet Take 1 tablet (5 mg total) by mouth at bedtime.  . insulin aspart (NOVOLOG) 100 UNIT/ML injection Sliding scale, take as directed  . insulin glargine (LANTUS) 100 UNIT/ML injection Inject 8 Units into the skin daily.  Marland Kitchen. lidocaine (LIDODERM) 5 % Place 1 patch onto the skin daily. Remove & Discard patch within 12 hours or as directed by MD  . linagliptin (TRADJENTA) 5 MG TABS tablet Take 1 tablet (5 mg total) by mouth daily.  . metFORMIN (GLUCOPHAGE) 500 MG tablet Take 500 mg by mouth 2 (two) times daily with a meal.  . metoprolol succinate (TOPROL-XL) 25 MG 24 hr tablet Take 25 mg by mouth daily.  . metoprolol tartrate (LOPRESSOR) 25 MG tablet Take 0.5 tablets (  12.5 mg total) by mouth daily.  . nitroGLYCERIN (NITROSTAT) 0.4 MG SL tablet Place one (1) tablet under the tongue every 5 minutes as needed for chest pain max 3 doses in 15 before calling 911.  . tamsulosin (FLOMAX) 0.4 MG CAPS capsule Take 0.4 mg by mouth daily after supper.  . traZODone (DESYREL) 50 MG tablet Take 50 mg by mouth at bedtime.   Allergies  Allergen Reactions  . Amlodipine Other (See Comments)    headache  . Lipitor [Atorvastatin Calcium] Other (See Comments)    Unknown reaction  . Lisinopril Cough    Rash / cough. However patient is currently taking Lotensin with no problems   Past Medical History:  Diagnosis Date  . Chest pain   . CHF (congestive heart failure) (HCC)   . CKD (chronic kidney  disease) stage 3, GFR 30-59 ml/min (HCC)   . Coronary artery disease    a. Remote MI 1981;  b. Prior CABG per Dr. Hendricks Milo in 1981;  c. redo CABG in 2009;  d. PCI to SVG to DX and OM in 2010;  e. PCI/DES VG-> OM (ISR) w/ 2.75x60mm Resolute DES Mar. 2013  . Diabetes mellitus   . GERD (gastroesophageal reflux disease)   . Hyperlipidemia   . Hypertension   . Status post insertion of non-drug eluting coronary artery stent 10/05/2008   Family History  Problem Relation Age of Onset  . Heart disease Father   . Heart attack Father   . Heart disease Mother   . Stroke Daughter        09/2008  . Heart attack Brother    Past Surgical History:  Procedure Laterality Date  . CARDIAC CATHETERIZATION  10/11/2008   PCI to SVG to diagonal and OM in 2010  . CORONARY ARTERY BYPASS GRAFT     original surgery in in 1981 with redo surgery in 2009. His original surgery was by Dr. Hendricks Milo and he had a SVG to the RCA, SVG to DX/OM, and SVG to DX/LAD; Redo surgery per Dr. Laneta Simmers in 2009 with LIMA to LAD and SVG to PD  . LEFT HEART CATHETERIZATION WITH CORONARY/GRAFT ANGIOGRAM N/A 09/18/2011   Procedure: LEFT HEART CATHETERIZATION WITH Isabel Caprice;  Surgeon: Herby Abraham, MD;  Location: [facility] CATH LAB;  Service: Cardiovascular;  Laterality: N/A;  . PERCUTANEOUS CORONARY STENT INTERVENTION (PCI-S) N/A 09/21/2011   Procedure: PERCUTANEOUS CORONARY STENT INTERVENTION (PCI-S);  Surgeon: Herby Abraham, MD;  Location: [facility] CATH LAB;  Service: Cardiovascular;  Laterality: N/A;   Social History   Socioeconomic History  . Marital status: Married    Spouse name: Not on file  . Number of children: Not on file  . Years of education: Not on file  . Highest education level: Not on file  Occupational History  . Occupation: Retired  Engineer, production  . Financial resource strain: Not on file  . Food insecurity:    Worry: Not on file    Inability: Not on file  . Transportation needs:    Medical: Not  on file    Non-medical: Not on file  Tobacco Use  . Smoking status: Never Smoker  . Smokeless tobacco: Never Used  Substance and Sexual Activity  . Alcohol use: No  . Drug use: No  . Sexual activity: Never  Lifestyle  . Physical activity:    Days per week: Not on file    Minutes per session: Not on file  . Stress: Not on file  Relationships  .  Social connections:    Talks on phone: Not on file    Gets together: Not on file    Attends religious service: Not on file    Active member of club or organization: Not on file    Attends meetings of clubs or organizations: Not on file    Relationship status: Not on file  . Intimate partner violence:    Fear of current or ex partner: Not on file    Emotionally abused: Not on file    Physically abused: Not on file    Forced sexual activity: Not on file  Other Topics Concern  . Not on file  Social History Narrative   Never smoker, nondrinker.       Review of Systems: General: negative for chills, fever, night sweats or weight changes.  Cardiovascular: negative for chest pain, dyspnea on exertion, edema, orthopnea, palpitations, paroxysmal nocturnal dyspnea or shortness of breath Dermatological: negative for rash Respiratory: negative for cough or wheezing Urologic: negative for hematuria Abdominal: negative for nausea, vomiting, diarrhea, bright red blood per rectum, melena, or hematemesis Neurologic: negative for visual changes, syncope, or dizziness All other systems reviewed and are otherwise negative except as noted above.   Physical Exam:  Blood pressure (!) 100/54, pulse 74, height 5\' 6"  (1.676 m), SpO2 96 %.  General appearance: alert, cooperative, no distress and frail appearing elderly male, in wheelchair Neck: no carotid bruit and no JVD Lungs: clear to auscultation bilaterally Heart: regular rate and rhythm, S1, S2 normal, no murmur, click, rub or gallop Extremities: extremities normal, atraumatic, no cyanosis or  edema Pulses: 2+ and symmetric Skin: Skin color, texture, turgor normal. No rashes or lesions Neurologic: Grossly normal  EKG not performed -- personally reviewed   ASSESSMENT AND PLAN:   1. CAD:  hx of CABG in 1981 & redo in 2009 followed by PCI w/ DES to the SVG-OM in 2013, DM, HTN, HLD, S-CHF w/ EF 45% at cath 2010.  No recent anginal symptomatology.  He denies chest pain and dyspnea.  Per Dr. Eldridge Dace, no further aggressive heart interventions or cath due to advanced age and dementia.  Continue medical therapy with aspirin Plavix and metoprolol.  Crestor is on hold due to elevated liver enzymes.  2. DLD: Pt was previously on Crestor but this was recently discontinued due to mildly elevated hepatic function test with ALT at 83 and AST at 42.  We will recheck hepatic function test today to reassess enzymes and will also check fasting lipid panel.  Continue to hold Crestor for now.  3. Dementia: Per PCP.  Patient resides in skilled nursing facility.  4. Frequent Falls: Patient now residing in a skilled nursing facility.  Patient was encouraged to call for assistance prior to ambulation to reduce risk for recurrent falls.  5. DM: poorly controlled.  Most recent Hgb A1c was 9.5.  Treatment per PCP.   Follow-Up w/ Dr. Eldridge Dace in 6 months.   Orlie Cundari Delmer Islam, MHS [facility] 02/01/2018 10:05 AM

## 2018-02-01 NOTE — Patient Instructions (Signed)
Medication Instructions:   Your physician recommends that you continue on your current medications as directed. Please refer to the Current Medication list given to you today.   If you need a refill on your cardiac medications before your next appointment, please call your pharmacy.  Labwork: FASTING LIPID AND  LFT TODAY    Testing/Procedures: NONE ORDERED  TODAY    Follow-Up:  Your physician wants you to follow-up in:  IN   6 MONTHS WITH DR Eldridge DaceVARANASI   You will receive a reminder letter in the mail two months in advance. If you don't receive a letter, please call our office to schedule the follow-up appointment.      Any Other Special Instructions Will Be Listed Below (If Applicable).

## 2018-04-21 ENCOUNTER — Other Ambulatory Visit: Payer: Self-pay

## 2018-04-21 ENCOUNTER — Emergency Department (HOSPITAL_COMMUNITY)
Admission: EM | Admit: 2018-04-21 | Discharge: 2018-04-21 | Disposition: A | Discharge status: Home / Self Care | Payer: Medicare Other | Attending: Emergency Medicine | Admitting: Emergency Medicine

## 2018-04-21 ENCOUNTER — Encounter (HOSPITAL_COMMUNITY): Payer: Self-pay | Admitting: Emergency Medicine

## 2018-04-21 ENCOUNTER — Emergency Department (HOSPITAL_COMMUNITY): Payer: Medicare Other

## 2018-04-21 DIAGNOSIS — Y939 Activity, unspecified: Secondary | ICD-10-CM | POA: Diagnosis not present

## 2018-04-21 DIAGNOSIS — I509 Heart failure, unspecified: Secondary | ICD-10-CM | POA: Insufficient documentation

## 2018-04-21 DIAGNOSIS — Y92122 Bedroom in nursing home as the place of occurrence of the external cause: Secondary | ICD-10-CM | POA: Diagnosis not present

## 2018-04-21 DIAGNOSIS — F0390 Unspecified dementia without behavioral disturbance: Secondary | ICD-10-CM | POA: Diagnosis not present

## 2018-04-21 DIAGNOSIS — Z794 Long term (current) use of insulin: Secondary | ICD-10-CM | POA: Insufficient documentation

## 2018-04-21 DIAGNOSIS — I130 Hypertensive heart and chronic kidney disease with heart failure and stage 1 through stage 4 chronic kidney disease, or unspecified chronic kidney disease: Secondary | ICD-10-CM | POA: Diagnosis not present

## 2018-04-21 DIAGNOSIS — Y999 Unspecified external cause status: Secondary | ICD-10-CM | POA: Insufficient documentation

## 2018-04-21 DIAGNOSIS — S0101XA Laceration without foreign body of scalp, initial encounter: Secondary | ICD-10-CM

## 2018-04-21 DIAGNOSIS — Z79899 Other long term (current) drug therapy: Secondary | ICD-10-CM | POA: Insufficient documentation

## 2018-04-21 DIAGNOSIS — S12111A Posterior displaced Type II dens fracture, initial encounter for closed fracture: Secondary | ICD-10-CM | POA: Diagnosis not present

## 2018-04-21 DIAGNOSIS — S0990XA Unspecified injury of head, initial encounter: Secondary | ICD-10-CM | POA: Diagnosis present

## 2018-04-21 DIAGNOSIS — S12100A Unspecified displaced fracture of second cervical vertebra, initial encounter for closed fracture: Secondary | ICD-10-CM

## 2018-04-21 DIAGNOSIS — I2510 Atherosclerotic heart disease of native coronary artery without angina pectoris: Secondary | ICD-10-CM | POA: Diagnosis not present

## 2018-04-21 DIAGNOSIS — W19XXXA Unspecified fall, initial encounter: Secondary | ICD-10-CM | POA: Insufficient documentation

## 2018-04-21 DIAGNOSIS — N183 Chronic kidney disease, stage 3 (moderate): Secondary | ICD-10-CM | POA: Insufficient documentation

## 2018-04-21 DIAGNOSIS — E1122 Type 2 diabetes mellitus with diabetic chronic kidney disease: Secondary | ICD-10-CM | POA: Insufficient documentation

## 2018-04-21 MED ORDER — LIDOCAINE-EPINEPHRINE-TETRACAINE (LET) SOLUTION
3.0000 mL | Freq: Once | NASAL | Status: AC
  Administered 2018-04-21: 3 mL via TOPICAL
  Filled 2018-04-21: qty 3

## 2018-04-21 MED ORDER — ACETAMINOPHEN 500 MG PO TABS: 1000.0000 mg | ORAL_TABLET | Freq: Once | ORAL | Status: DC

## 2018-04-21 NOTE — ED Triage Notes (Signed)
Pt arrived [facility] from [facility] facility s/p fall with subsequent laceration to the back of his head. Per EMS facility heard the patient screaming for help, found him on the floor, and then picked him up and put him back in bed.  EMS vitals BP132/60 RR16 P62 [id number] RA

## 2018-04-21 NOTE — ED Notes (Signed)
Patient transported to CT 

## 2018-04-21 NOTE — Discharge Instructions (Addendum)
Thank you for allowing me to care for you today in the Emergency Department.   Your staples need to be removed in 7 days.   Call Dr. Lovell Sheehan to schedule a follow up appointment regarding the fracture of your odontoid bone in the clinic. Wear the Aspen neck collar until you are

## 2018-04-21 NOTE — ED Provider Notes (Signed)
[facility] EMERGENCY DEPARTMENT Provider Note   CSN: [id number] Arrival date & time: 04/21/18  0234     History   Chief Complaint Chief Complaint  Patient presents with  . Fall  . Head Injury    HPI Jerry Benson is a 84 y.o. male with a h/o dementia, coronary artery disease, chronic kidney disease stage III, diabetes mellitus type 2 who presents to the emergency department by EMS from [facility] nursing facility with a chief complaint of fall.  EMS reports that nursing staff heard the patient calling for help and entered his room to find him laying in the floor.  They picked him up and put him back in bed before calling 911.  In the ED, patient states that he falls about 3 times a month.  He states "I do not know how he fell so fast."  Cannot recall how he fell, but states he fell backwards.  No known aggravating or alleviating factors.  He endorses a headache, but denies neck, back, hip pain, or pain to the bilateral upper or lower extremities.  Denies numbness or weakness.  No treatment prior to arrival.  The history is provided by the patient and the EMS personnel. The history is limited by the condition of the patient (dementia). No language interpreter was used.  Fall  Associated symptoms include headaches. Pertinent negatives include no chest pain, no abdominal pain and no shortness of breath.  Head Injury   Pertinent negatives include no numbness and no weakness.    Past Medical History:  Diagnosis Date  . Chest pain   . CHF (congestive heart failure) (HCC)   . CKD (chronic kidney disease) stage 3, GFR 30-59 ml/min (HCC)   . Coronary artery disease    a. Remote MI 1981;  b. Prior CABG per Dr. Hendricks Milo in 1981;  c. redo CABG in 2009;  d. PCI to SVG to DX and OM in 2010;  e. PCI/DES VG-> OM (ISR) w/ 2.75x37mm Resolute DES Mar. 2013  . Diabetes mellitus   . GERD (gastroesophageal reflux disease)   . Hyperlipidemia   . Hypertension   . Status post  insertion of non-drug eluting coronary artery stent 10/05/2008    Patient Active Problem List   Diagnosis Date Noted  . NSTEMI (non-ST elevated myocardial infarction) (HCC) 11/19/2017  . Hyperglycemia 11/19/2017  . Pressure injury of skin 11/09/2017  . Hypoglycemia 11/08/2017  . Fall at home, initial encounter 11/08/2017  . Closed rib fracture 11/08/2017  . Word finding difficulty 04/14/2016  . Acute encephalopathy 04/14/2016  . Expressive aphasia 04/14/2016  . Hypertension 04/14/2016  . TIA (transient ischemic attack) 07/11/2015  . Memory disorder 05/28/2014  . Postherpetic neuralgia 09/25/2013  . Intertriginous candidiasis 01/12/2013  . Iron deficiency anemia 09/15/2012  . Malaise and fatigue 11/16/2011  . Unstable angina pectoris (HCC) 09/19/2011  . CKD (chronic kidney disease) stage 3, GFR 30-59 ml/min (HCC) 09/19/2011  . CAD (coronary artery disease) 01/21/2011  . Type 2 diabetes mellitus with vascular disease (HCC) 10/17/2010  . Hypercholesterolemia 10/17/2010  . Hx of CABG 10/17/2010  . Benign hypertensive heart disease without heart failure 10/17/2010  . Dyspepsia 10/17/2010    Past Surgical History:  Procedure Laterality Date  . CARDIAC CATHETERIZATION  10/11/2008   PCI to SVG to diagonal and OM in 2010  . CORONARY ARTERY BYPASS GRAFT     original surgery in in 1981 with redo surgery in 2009. His original surgery was by Dr.  Hendricks Milo and he had a SVG to the RCA, SVG to DX/OM, and SVG to DX/LAD; Redo surgery per Dr. Laneta Simmers in 2009 with LIMA to LAD and SVG to PD  . LEFT HEART CATHETERIZATION WITH CORONARY/GRAFT ANGIOGRAM N/A 09/18/2011   Procedure: LEFT HEART CATHETERIZATION WITH Isabel Caprice;  Surgeon: Herby Abraham, MD;  Location: [facility] CATH LAB;  Service: Cardiovascular;  Laterality: N/A;  . PERCUTANEOUS CORONARY STENT INTERVENTION (PCI-S) N/A 09/21/2011   Procedure: PERCUTANEOUS CORONARY STENT INTERVENTION (PCI-S);  Surgeon: Herby Abraham, MD;   Location: [facility] CATH LAB;  Service: Cardiovascular;  Laterality: N/A;        Home Medications    Prior to Admission medications   Medication Sig Start Date End Date Taking? Authorizing Provider  acetaminophen (TYLENOL) 325 MG tablet Take 650 mg by mouth 4 (four) times daily.    Yes [provider]  carbidopa-levodopa (SINEMET IR) 10-100 MG tablet Take 1 tablet by mouth 3 (three) times daily.   Yes [provider]  donepezil (ARICEPT) 5 MG tablet Take 1 tablet (5 mg total) by mouth at bedtime. 04/16/16  Yes Alison Murray, MD  insulin glargine (LANTUS) 100 UNIT/ML injection Inject 12 Units into the skin at bedtime.    Yes [provider]  lidocaine (LIDODERM) 5 % Place 1 patch onto the skin daily. Remove & Discard patch within 12 hours or as directed by MD 11/10/17  Yes Barnetta Chapel, MD  linagliptin (TRADJENTA) 5 MG TABS tablet Take 1 tablet (5 mg total) by mouth daily. 11/23/17  Yes Osvaldo Shipper, MD  metoprolol succinate (TOPROL-XL) 25 MG 24 hr tablet Take 25 mg by mouth daily. 12/17/17  Yes [provider]  nitroGLYCERIN (NITROSTAT) 0.4 MG SL tablet Place one (1) tablet under the tongue every 5 minutes as needed for chest pain max 3 doses in 15 before calling 911. 05/25/17  Yes Corky Crafts, MD  traZODone (DESYREL) 50 MG tablet Take 50 mg by mouth at bedtime.   Yes [provider]    Family History Family History  Problem Relation Age of Onset  . Heart disease Father   . Heart attack Father   . Heart disease Mother   . Stroke Daughter        09/2008  . Heart attack Brother     Social History Social History   Tobacco Use  . Smoking status: Never Smoker  . Smokeless tobacco: Never Used  Substance Use Topics  . Alcohol use: No  . Drug use: No     Allergies   Amlodipine; Lipitor [atorvastatin calcium]; and Lisinopril   Review of Systems Review of Systems  Constitutional: Negative for appetite change and fever.    Eyes: Negative for visual disturbance.  Respiratory: Negative for shortness of breath.   Cardiovascular: Negative for chest pain.  Gastrointestinal: Negative for abdominal pain.  Genitourinary: Negative for dysuria.  Musculoskeletal: Positive for arthralgias. Negative for back pain, neck pain and neck stiffness.  Skin: Positive for wound. Negative for rash.  Allergic/Immunologic: Negative for immunocompromised state.  Neurological: Positive for headaches. Negative for weakness and numbness.  Psychiatric/Behavioral: Negative for confusion.   Physical Exam Updated Vital Signs BP 126/66 (BP Location: Right Arm)   Pulse 62   Temp 97.9 F (36.6 C) (Oral)   Resp 15   SpO2 100%   Physical Exam  Constitutional: He appears well-developed.  HENT:  Head: Normocephalic.  There is a gaping 1.5 cm superficial laceration to the occipital scalp.  Wound is mildly oozing, but controlled with pressure.  Does not appear contaminated.  No obvious foreign bodies.  Eyes: Pupils are equal, round, and reactive to light. Conjunctivae and EOM are normal.  Neck: Neck supple.  Cardiovascular: Normal rate, regular rhythm, normal heart sounds and intact distal pulses. Exam reveals no gallop and no friction rub.  No murmur heard. Pulmonary/Chest: Effort normal. No stridor. No respiratory distress. He has no wheezes. He has no rales. He exhibits no tenderness.  Abdominal: Soft. He exhibits no distension.  Musculoskeletal:  No focal tenderness to the cervical, thoracic, or lumbar spinous processes or bilateral paraspinal muscles.  No tenderness to the bilateral hips, knees, ankles, shoulders, elbows, wrists.  Patient laying with right lower extremity externally rotated, but patient has full range of motion of the right ankle, hip, and knee without pain.  Sensation is intact and equal.  DP and radial pulses are 2+ and symmetric.  Neurological: He is alert.  Oriented to person, place, and year but not month.  Skin:  Skin is warm and dry.  Psychiatric: His behavior is normal.  Nursing note and vitals reviewed.  ED Treatments / Results  Labs (all labs ordered are listed, but only abnormal results are displayed) Labs Reviewed - No data to display  EKG EKG Interpretation  Date/Time:  Thursday April 21 2018 02:44:55 EDT Ventricular Rate:  62 PR Interval:    QRS Duration: 120 QT Interval:  414 QTC Calculation: 421 R Axis:   10 Text Interpretation:  Sinus rhythm LVH with secondary repolarization abnormality  LVH Baseline wander in lead(s) II III aVL aVF Confirmed by Nicanor Alcon, April ([id number]) on 04/21/2018 3:09:07 AM   Radiology Ct Head Wo Contrast  Result Date: 04/21/2018 CLINICAL DATA:  Fall EXAM: CT HEAD WITHOUT CONTRAST CT CERVICAL SPINE WITHOUT CONTRAST TECHNIQUE: Multidetector CT imaging of the head and cervical spine was performed following the standard protocol without intravenous contrast. Multiplanar CT image reconstructions of the cervical spine were also generated. COMPARISON:  CT brain 01/13/2018, CT neck 04/14/2016 FINDINGS: CT HEAD FINDINGS Brain: No acute territorial infarction, hemorrhage or intracranial mass. Atrophy and mild small vessel ischemic changes of the white matter. Stable prominent ventricle size. Vascular: No hyperdense vessels.  Carotid vascular calcification. Skull: No fracture. Opacified left mastoid air cells with postsurgical changes Sinuses/Orbits: Mucosal thickening in the maxillary and ethmoid sinuses Other: None CT CERVICAL SPINE FINDINGS Alignment: Posterior offset of the spinal laminar line at C1. Grade 1 anterolisthesis of C3 on C4, C4 on C5, and C5 on C6. 11 mm posterior offset of the left lateral mass of C1 with respect to C2. Facet alignment within normal limits. Skull base and vertebrae: Fracture through the base of the odontoid with about 4 mm posterior displacement of the odontoid fracture fragment. 6 mm separation of the odontoid PEG from the body of C2. Partial  fusion of the anterior arch of C1 to the odontoid. Attempted callus about the fracture site with sclerotic margins suggesting chronic ununited fracture. Calcified pannus around the odontoid. Remaining vertebral bodies demonstrate normal stature. Soft tissues and spinal canal: Prominent prevertebral soft tissues at C2. Disc levels: Marked degenerative changes C4-C5 and C6-C7 and C7-T1 with moderate degenerative change at C5-C6. Multiple level facet degenerative change, left greater than right. Upper chest: Lung apices are clear.  Thyroid within normal limits. Other: None IMPRESSION: 1. No CT evidence for acute intracranial abnormality. Atrophy and small vessel ischemic changes of the white matter 2. Fracture through the base of the odontoid  with 4 mm posterior displacement of the odontoid fracture fragment and offset of the posterior spinal laminar line. Attempted callus anteriorly at the fracture site with sclerosis at the fracture margin suggesting chronic ununited fracture. As compared with limited images of this region on sagittal head CT from July 2019, the posterior displacement of the odontoid is new. 3. Diffuse degenerative changes with grade 1 anterolisthesis C3 on C4, C4 on C5 and C5 on C6, likely degenerative. Critical Value/emergent results were called by telephone at the time of interpretation on 04/21/2018 at 3:49 am to Dr. Cy Blamer , who verbally acknowledged these results. Electronically Signed   By: Jasmine Pang M.D.   On: 04/21/2018 03:50   Ct Cervical Spine Wo Contrast  Result Date: 04/21/2018 CLINICAL DATA:  Fall EXAM: CT HEAD WITHOUT CONTRAST CT CERVICAL SPINE WITHOUT CONTRAST TECHNIQUE: Multidetector CT imaging of the head and cervical spine was performed following the standard protocol without intravenous contrast. Multiplanar CT image reconstructions of the cervical spine were also generated. COMPARISON:  CT brain 01/13/2018, CT neck 04/14/2016 FINDINGS: CT HEAD FINDINGS Brain: No  acute territorial infarction, hemorrhage or intracranial mass. Atrophy and mild small vessel ischemic changes of the white matter. Stable prominent ventricle size. Vascular: No hyperdense vessels.  Carotid vascular calcification. Skull: No fracture. Opacified left mastoid air cells with postsurgical changes Sinuses/Orbits: Mucosal thickening in the maxillary and ethmoid sinuses Other: None CT CERVICAL SPINE FINDINGS Alignment: Posterior offset of the spinal laminar line at C1. Grade 1 anterolisthesis of C3 on C4, C4 on C5, and C5 on C6. 11 mm posterior offset of the left lateral mass of C1 with respect to C2. Facet alignment within normal limits. Skull base and vertebrae: Fracture through the base of the odontoid with about 4 mm posterior displacement of the odontoid fracture fragment. 6 mm separation of the odontoid PEG from the body of C2. Partial fusion of the anterior arch of C1 to the odontoid. Attempted callus about the fracture site with sclerotic margins suggesting chronic ununited fracture. Calcified pannus around the odontoid. Remaining vertebral bodies demonstrate normal stature. Soft tissues and spinal canal: Prominent prevertebral soft tissues at C2. Disc levels: Marked degenerative changes C4-C5 and C6-C7 and C7-T1 with moderate degenerative change at C5-C6. Multiple level facet degenerative change, left greater than right. Upper chest: Lung apices are clear.  Thyroid within normal limits. Other: None IMPRESSION: 1. No CT evidence for acute intracranial abnormality. Atrophy and small vessel ischemic changes of the white matter 2. Fracture through the base of the odontoid with 4 mm posterior displacement of the odontoid fracture fragment and offset of the posterior spinal laminar line. Attempted callus anteriorly at the fracture site with sclerosis at the fracture margin suggesting chronic ununited fracture. As compared with limited images of this region on sagittal head CT from July 2019, the posterior  displacement of the odontoid is new. 3. Diffuse degenerative changes with grade 1 anterolisthesis C3 on C4, C4 on C5 and C5 on C6, likely degenerative. Critical Value/emergent results were called by telephone at the time of interpretation on 04/21/2018 at 3:49 am to Dr. Cy Blamer , who verbally acknowledged these results. Electronically Signed   By: Jasmine Pang M.D.   On: 04/21/2018 03:50    Procedures .Marland KitchenLaceration Repair Date/Time: 04/21/2018 5:32 AM Performed by: Barkley Boards, PA-C Authorized by: Barkley Boards, PA-C   Consent:    Consent obtained:  Verbal   Consent given by:  Patient   Risks discussed:  Infection, pain  and need for additional repair   Alternatives discussed:  No treatment Anesthesia (see MAR for exact dosages):    Anesthesia method:  Topical application   Topical anesthetic:  LET Laceration details:    Location:  Scalp   Scalp location:  Occipital   Length (cm):  1.5 Repair type:    Repair type:  Simple Pre-procedure details:    Preparation:  Patient was prepped and draped in usual sterile fashion and imaging obtained to evaluate for foreign bodies Exploration:    Wound exploration: wound explored through full range of motion and entire depth of wound probed and visualized     Wound extent: no fascia violation noted, no foreign bodies/material noted, no muscle damage noted, no nerve damage noted, no tendon damage noted, no underlying fracture noted and no vascular damage noted     Contaminated: no   Treatment:    Area cleansed with:  Shur-Clens and saline   Amount of cleaning:  Extensive   Visualized foreign bodies/material removed: no   Skin repair:    Repair method:  Staples   Number of staples:  3 Approximation:    Approximation:  Close Post-procedure details:    Dressing:  Open (no dressing)   Patient tolerance of procedure:  Tolerated well, no immediate complications   (including critical care time)  Medications Ordered in ED Medications   acetaminophen (TYLENOL) tablet 1,000 mg (has no administration in time range)  lidocaine-EPINEPHrine-tetracaine (LET) solution (3 mLs Topical Given 04/21/18 0351)     Initial Impression / Assessment and Plan / ED Course  I have reviewed the triage vital signs and the nursing notes.  Pertinent labs & imaging results that were available during my care of the patient were reviewed by me and considered in my medical decision making (see chart for details).  Clinical Course as of Apr 22 535  Thu Apr 21, 2018  0402 On reevaluation of the neck, with palpation to the odontoid, the patient denies pain, but states "it's not hurting now, but it has been for the last 4 or 5 months."   [MM]    Clinical Course User Index [MM] Betul Brisky A, PA-C    84 year old male with a h/o dementia, coronary artery disease, chronic kidney disease stage III, diabetes mellitus type 2 presenting to the ED by EMS from [facility] for a fall.  Patient was discussed and evaluated by Dr. Terressa Koyanagi, attending physician.  On exam there is a 1.5 cm laceration to the occipital scalp that was repaired with 3 staples.  CT cervical spine with fracture through the base of the odontoid with a 4 mm posterior displacement of the odontoid fracture fragment and offset of the posterior spinal laminar line.  There is concern this may be subacute, but posterior displacement of the odontoid is new.  Consulted Dr. Lovell Sheehan with neurosurgery who recommended placing the patient in an Aspen collar and having him follow-up with neurosurgery in the clinic.  CT head is otherwise unremarkable.  No other focal findings or tenderness on head to toe exam.  3 staples were placed in the occipital scalp for laceration closure after topical let was used for anesthesia.  Please see procedure note for full details.  At this time, the patient is safe for return to [place].  Called and spoke with the patient's wife regarding the patient's  status prior to his discharge.  Strict return precautions given.  He is hemodynamically stable and in no acute distress.  He is safe  for discharge to home with outpatient follow-up at this time.  Final Clinical Impressions(s) / ED Diagnoses   Final diagnoses:  Fall, initial encounter  Laceration of scalp, initial encounter  Closed odontoid fracture, initial encounter [facility])    ED Discharge Orders    None       Barkley Boards, PA-C 04/21/18 0743    Palumbo, April, MD 04/26/18 1327

## 2018-04-30 ENCOUNTER — Encounter (HOSPITAL_COMMUNITY): Payer: Self-pay

## 2018-04-30 ENCOUNTER — Emergency Department (HOSPITAL_COMMUNITY)
Admission: EM | Admit: 2018-04-30 | Discharge: 2018-04-30 | Disposition: A | Discharge status: Skilled Nursing Facility | Payer: Medicare Other | Attending: Emergency Medicine | Admitting: Emergency Medicine

## 2018-04-30 ENCOUNTER — Other Ambulatory Visit: Payer: Self-pay

## 2018-04-30 ENCOUNTER — Emergency Department (HOSPITAL_COMMUNITY): Payer: Medicare Other

## 2018-04-30 DIAGNOSIS — S0181XA Laceration without foreign body of other part of head, initial encounter: Secondary | ICD-10-CM | POA: Insufficient documentation

## 2018-04-30 DIAGNOSIS — R296 Repeated falls: Secondary | ICD-10-CM | POA: Insufficient documentation

## 2018-04-30 DIAGNOSIS — I509 Heart failure, unspecified: Secondary | ICD-10-CM | POA: Insufficient documentation

## 2018-04-30 DIAGNOSIS — F0390 Unspecified dementia without behavioral disturbance: Secondary | ICD-10-CM | POA: Insufficient documentation

## 2018-04-30 DIAGNOSIS — Y999 Unspecified external cause status: Secondary | ICD-10-CM | POA: Diagnosis not present

## 2018-04-30 DIAGNOSIS — S12100K Unspecified displaced fracture of second cervical vertebra, subsequent encounter for fracture with nonunion: Secondary | ICD-10-CM | POA: Insufficient documentation

## 2018-04-30 DIAGNOSIS — Z794 Long term (current) use of insulin: Secondary | ICD-10-CM | POA: Diagnosis not present

## 2018-04-30 DIAGNOSIS — Y9301 Activity, walking, marching and hiking: Secondary | ICD-10-CM | POA: Insufficient documentation

## 2018-04-30 DIAGNOSIS — N183 Chronic kidney disease, stage 3 (moderate): Secondary | ICD-10-CM | POA: Diagnosis not present

## 2018-04-30 DIAGNOSIS — S01112A Laceration without foreign body of left eyelid and periocular area, initial encounter: Secondary | ICD-10-CM

## 2018-04-30 DIAGNOSIS — R21 Rash and other nonspecific skin eruption: Secondary | ICD-10-CM | POA: Diagnosis not present

## 2018-04-30 DIAGNOSIS — Y92129 Unspecified place in nursing home as the place of occurrence of the external cause: Secondary | ICD-10-CM | POA: Diagnosis not present

## 2018-04-30 DIAGNOSIS — Z79899 Other long term (current) drug therapy: Secondary | ICD-10-CM | POA: Insufficient documentation

## 2018-04-30 DIAGNOSIS — S50812A Abrasion of left forearm, initial encounter: Secondary | ICD-10-CM | POA: Diagnosis not present

## 2018-04-30 DIAGNOSIS — W19XXXA Unspecified fall, initial encounter: Secondary | ICD-10-CM | POA: Insufficient documentation

## 2018-04-30 DIAGNOSIS — Z955 Presence of coronary angioplasty implant and graft: Secondary | ICD-10-CM | POA: Insufficient documentation

## 2018-04-30 DIAGNOSIS — S0993XA Unspecified injury of face, initial encounter: Secondary | ICD-10-CM | POA: Diagnosis present

## 2018-04-30 DIAGNOSIS — R0781 Pleurodynia: Secondary | ICD-10-CM | POA: Diagnosis not present

## 2018-04-30 DIAGNOSIS — E1122 Type 2 diabetes mellitus with diabetic chronic kidney disease: Secondary | ICD-10-CM | POA: Insufficient documentation

## 2018-04-30 DIAGNOSIS — S0081XA Abrasion of other part of head, initial encounter: Secondary | ICD-10-CM | POA: Diagnosis not present

## 2018-04-30 DIAGNOSIS — I130 Hypertensive heart and chronic kidney disease with heart failure and stage 1 through stage 4 chronic kidney disease, or unspecified chronic kidney disease: Secondary | ICD-10-CM | POA: Diagnosis not present

## 2018-04-30 DIAGNOSIS — Z951 Presence of aortocoronary bypass graft: Secondary | ICD-10-CM | POA: Insufficient documentation

## 2018-04-30 HISTORY — DX: Unspecified dementia, unspecified severity, without behavioral disturbance, psychotic disturbance, mood disturbance, and anxiety: F03.90

## 2018-04-30 MED ORDER — HYDROCORTISONE 1 % EX CREA: TOPICAL_CREAM | CUTANEOUS | 0 refills | Status: AC

## 2018-04-30 NOTE — ED Notes (Signed)
Bed: QM57 Expected date:  Expected time:  Means of arrival:  Comments: EMS 84 yo male fall-lost balance in room at SNF-abrasions-no LOC-no neck or back pain/headache-no blood thinners BP 140/72  HR 62

## 2018-04-30 NOTE — ED Notes (Signed)
PT DENIES LOC,AND C/O PAIN TO THE BACK OF THE NECK AND RIGHT RIB CAGE.

## 2018-04-30 NOTE — ED Notes (Signed)
PT DISCHARGED, AND WILL BE TRANSPORTED BACK TO [facility]. ASPEN COLLAR IN PLACE. INSTRUCTIONS AND PRESCRIPTIONS GIVEN TO PTAR STAFF. AAOX3. PT IN NO APPARENT DISTRESS OR PAIN. THE OPPORTUNITY TO ASK QUESTIONS WAS PROVIDED.

## 2018-04-30 NOTE — ED Provider Notes (Addendum)
[facility]-EMERGENCY DEPT Provider Note: Lowella Dell, MD, FACEP  CSN: [id number] MRN: [id number] ARRIVAL: 04/30/18 at 0137 ROOM: WA19/WA19   CHIEF COMPLAINT  Fall  Level 5 caveat: Dementia HISTORY OF PRESENT ILLNESS  04/30/18 2:55 AM Jerry Benson is a 84 y.o. male with a history of dementia and frequent falls.  He was seen on the 17th of this month after a fall and diagnosed with an odontoid fracture.  Dr. Lovell Sheehan of neurosurgery recommended he be placed in an Aspen collar and follow-up as an outpatient.  He is here this morning after an unwitnessed fall.  He is not wearing an Biochemist, clinical.  The patient states he was trying to walk to the bathroom and lost his balance, falling against the wardrobe.  He has abrasions to his forehead and a laceration to his left lateral eyebrow.  He also has a abrasion to his left forearm which he states is from a previous fall.  He denies arm or leg injury.  He is having some neck pain.   Past Medical History:  Diagnosis Date  . Chest pain   . CHF (congestive heart failure) (HCC)   . CKD (chronic kidney disease) stage 3, GFR 30-59 ml/min (HCC)   . Coronary artery disease    a. Remote MI 1981;  b. Prior CABG per Dr. Hendricks Milo in 1981;  c. redo CABG in 2009;  d. PCI to SVG to DX and OM in 2010;  e. PCI/DES VG-> OM (ISR) w/ 2.75x27mm Resolute DES Mar. 2013  . Dementia (HCC)   . Diabetes mellitus   . GERD (gastroesophageal reflux disease)   . Hyperlipidemia   . Hypertension   . Status post insertion of non-drug eluting coronary artery stent 10/05/2008    Past Surgical History:  Procedure Laterality Date  . CARDIAC CATHETERIZATION  10/11/2008   PCI to SVG to diagonal and OM in 2010  . CORONARY ARTERY BYPASS GRAFT     original surgery in in 1981 with redo surgery in 2009. His original surgery was by Dr. Hendricks Milo and he had a SVG to the RCA, SVG to DX/OM, and SVG to DX/LAD; Redo surgery per Dr. Laneta Simmers in 2009 with LIMA to LAD and SVG to PD  . LEFT  HEART CATHETERIZATION WITH CORONARY/GRAFT ANGIOGRAM N/A 09/18/2011   Procedure: LEFT HEART CATHETERIZATION WITH Isabel Caprice;  Surgeon: Herby Abraham, MD;  Location: [facility] CATH LAB;  Service: Cardiovascular;  Laterality: N/A;  . PERCUTANEOUS CORONARY STENT INTERVENTION (PCI-S) N/A 09/21/2011   Procedure: PERCUTANEOUS CORONARY STENT INTERVENTION (PCI-S);  Surgeon: Herby Abraham, MD;  Location: [facility] CATH LAB;  Service: Cardiovascular;  Laterality: N/A;    Family History  Problem Relation Age of Onset  . Heart disease Father   . Heart attack Father   . Heart disease Mother   . Stroke Daughter        09/2008  . Heart attack Brother     Social History   Tobacco Use  . Smoking status: Never Smoker  . Smokeless tobacco: Never Used  Substance Use Topics  . Alcohol use: No  . Drug use: No    Prior to Admission medications   Medication Sig Start Date End Date Taking? Authorizing Provider  acetaminophen (TYLENOL) 325 MG tablet Take 650 mg by mouth 4 (four) times daily.    Yes [provider]  carbidopa-levodopa (SINEMET IR) 10-100 MG tablet Take 1 tablet by mouth 3 (three) times daily.   Yes [provider]  cholecalciferol (VITAMIN D) 1000 units tablet Take 1,000 Units by mouth daily.   Yes [provider]  donepezil (ARICEPT) 5 MG tablet Take 1 tablet (5 mg total) by mouth at bedtime. 04/16/16  Yes Alison Murray, MD  insulin glargine (LANTUS) 100 UNIT/ML injection Inject 12 Units into the skin at bedtime.    Yes [provider]  lidocaine (LIDODERM) 5 % Place 1 patch onto the skin daily. Remove & Discard patch within 12 hours or as directed by MD 11/10/17  Yes Barnetta Chapel, MD  linagliptin (TRADJENTA) 5 MG TABS tablet Take 1 tablet (5 mg total) by mouth daily. 11/23/17  Yes Osvaldo Shipper, MD  metoprolol succinate (TOPROL-XL) 25 MG 24 hr tablet Take 25 mg by mouth daily. 12/17/17  Yes [provider]  traZODone (DESYREL) 50 MG  tablet Take 50 mg by mouth at bedtime.   Yes [provider]  nitroGLYCERIN (NITROSTAT) 0.4 MG SL tablet Place one (1) tablet under the tongue every 5 minutes as needed for chest pain max 3 doses in 15 before calling 911. 05/25/17   Corky Crafts, MD    Allergies Amlodipine; Lipitor [atorvastatin calcium]; and Lisinopril   REVIEW OF SYSTEMS     PHYSICAL EXAMINATION  Initial Vital Signs Blood pressure 118/68, pulse 61, temperature 98 F (36.7 C), temperature source Oral, resp. rate 16, height 5\' 6"  (1.676 m), weight 49.9 kg, SpO2 96 %.  Examination General: Well-developed, well-nourished male in no acute distress; appearance consistent with age of record HENT: normocephalic; abrasions to forehead; laceration to the left lateral eyebrow; no hemotympanum Eyes: pupils equal, round and reactive to light; extraocular muscles intact Neck: Limited range of motion consistent with known chronic changes; midline tenderness Heart: regular rate and rhythm Lungs: clear to auscultation bilaterally Abdomen: soft; nondistended; nontender; bowel sounds present Extremities: Arthritic changes; nontender; trace edema lower legs Neurologic: Awake, alert; motor function intact in all extremities and symmetric; no facial droop Skin: Warm and dry; healing abrasion left volar forearm; generalized maculopapular rash primarily of back and axillae Psychiatric: Normal mood and affect   RESULTS  Summary of this visit's results, reviewed by myself:   EKG Interpretation  Date/Time:    Ventricular Rate:    PR Interval:    QRS Duration:   QT Interval:    QTC Calculation:   R Axis:     Text Interpretation:        Laboratory Studies: No results found for this or any previous visit (from the past 24 hour(s)). Imaging Studies: Ct Head Wo Contrast  Result Date: 04/30/2018 CLINICAL DATA:  84 year old male with fall. EXAM: CT HEAD WITHOUT CONTRAST CT CERVICAL SPINE WITHOUT CONTRAST  TECHNIQUE: Multidetector CT imaging of the head and cervical spine was performed following the standard protocol without intravenous contrast. Multiplanar CT image reconstructions of the cervical spine were also generated. COMPARISON:  Head CT dated 04/21/2018 FINDINGS: CT HEAD FINDINGS Brain: There is age-related atrophy and chronic microvascular ischemic changes. There is no acute intracranial hemorrhage. No mass effect or midline shift. No extra-axial fluid collection. Vascular: No hyperdense vessel or unexpected calcification. Postsurgical changes of left mastoid surgery. No acute calvarial pathology. Skull: Diffuse mucoperiosteal thickening and partial opacification of the paranasal sinuses. Sinuses/Orbits: No acute finding. Other: None CT CERVICAL SPINE FINDINGS Alignment: Posterior subluxation of C1 over C2 approximately 11 mm similar to prior CT. No acute subluxation. Grade 1 anterolisthesis of C5 over C6. Skull base and vertebrae: No acute fracture. 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 Soft tissues and spinal canal: No prevertebral fluid or swelling. No visible canal hematoma. Disc levels: Multilevel degenerative changes with endplate irregularity and disc space narrowing most prominent at C4-C5. Upper chest: Negative. Other: Bilateral carotid bulb calcified plaques. IMPRESSION: 1. No acute intracranial hemorrhage. 2. No acute cervical spine fracture. 3. 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 Electronically Signed   By: Elgie Collard M.D.   On: 04/30/2018 03:55   Ct Cervical Spine Wo Contrast  Result Date: 04/30/2018 CLINICAL DATA:  84 year old male with fall. EXAM: CT HEAD WITHOUT CONTRAST CT CERVICAL SPINE WITHOUT CONTRAST TECHNIQUE: Multidetector CT imaging of the head and cervical spine was performed following the standard protocol without intravenous contrast. Multiplanar CT image reconstructions of the  cervical spine were also generated. COMPARISON:  Head CT dated 04/21/2018 FINDINGS: CT HEAD FINDINGS Brain: There is age-related atrophy and chronic microvascular ischemic changes. There is no acute intracranial hemorrhage. No mass effect or midline shift. No extra-axial fluid collection. Vascular: No hyperdense vessel or unexpected calcification. Postsurgical changes of left mastoid surgery. No acute calvarial pathology. Skull: Diffuse mucoperiosteal thickening and partial opacification of the paranasal sinuses. Sinuses/Orbits: No acute finding. Other: None CT CERVICAL SPINE FINDINGS Alignment: Posterior subluxation of C1 over C2 approximately 11 mm similar to prior CT. No acute subluxation. Grade 1 anterolisthesis of C5 over C6. Skull base and vertebrae: No acute fracture. 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 Soft tissues and spinal canal: No prevertebral fluid or swelling. No visible canal hematoma. Disc levels: Multilevel degenerative changes with endplate irregularity and disc space narrowing most prominent at C4-C5. Upper chest: Negative. Other: Bilateral carotid bulb calcified plaques. IMPRESSION: 1. No acute intracranial hemorrhage. 2. No acute cervical spine fracture. 3. 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 Electronically Signed   By: Elgie Collard M.D.   On: 04/30/2018 03:55    ED COURSE and MDM  Nursing notes and initial vitals signs, including pulse oximetry, reviewed.  Vitals:   04/30/18 0142 04/30/18 0151 04/30/18 0400  BP: 118/68  137/64  Pulse: 61  (!) 55  Resp: 16  16  Temp: 98 F (36.7 C)    TempSrc: Oral    SpO2: 96%  98%  Weight:  49.9 kg   Height:  5\' 6"  (1.676 m)    4:00 AM Patient placed back in Aspen collar.   PROCEDURES   LACERATION REPAIR Performed by: Carlisle Beers Tamasha Laplante Authorized by: Carlisle Beers Lashena Signer Consent: Verbal consent obtained. Risks and benefits: risks, benefits and  alternatives were discussed Consent given by: patient Patient identity confirmed: provided demographic data Prepped and Draped in normal sterile fashion Wound explored  Laceration Location: Left eyebrow  Laceration Length: 2 cm  No Foreign Bodies seen or palpated  Anesthesia: None  Irrigation method: syringe Amount of cleaning: standard  Skin closure: Dermabond  Patient tolerance: Patient tolerated the procedure well with no immediate complications.   ED DIAGNOSES     ICD-10-CM   1. Fall at nursing home, initial encounter W19.XXXA    Y92.129   2. Abrasion of forehead, initial encounter S00.81XA   3. Laceration of left eyebrow, initial encounter S01.112A   4. Closed odontoid fracture with nonunion, subsequent encounter S12.100K   5. Rash R21        Milus Fritze, Jonny Ruiz, MD 04/30/18 [phone]  Rameen Gohlke, Jonny Ruiz, MD 04/30/18 [phone]

## 2018-04-30 NOTE — ED Triage Notes (Signed)
Patient arrives by [facility] from [place] [facility] with complaints of unwitnessed fall-patient complaining of headache-abrasions to forehead-no blood thinners. EMS found patient in wheelchair alert and oriented-staff picked patient up from floor. Patient trying to get up from wheelchair in his room to get in his dresser. Towel around neck for c-spine precautions.

## 2018-05-02 ENCOUNTER — Emergency Department (HOSPITAL_COMMUNITY)
Admission: EM | Admit: 2018-05-02 | Discharge: 2018-05-06 | Disposition: E | Payer: Medicare Other | Attending: Emergency Medicine | Admitting: Emergency Medicine

## 2018-05-02 DIAGNOSIS — I509 Heart failure, unspecified: Secondary | ICD-10-CM | POA: Insufficient documentation

## 2018-05-02 DIAGNOSIS — E1122 Type 2 diabetes mellitus with diabetic chronic kidney disease: Secondary | ICD-10-CM | POA: Diagnosis not present

## 2018-05-02 DIAGNOSIS — I469 Cardiac arrest, cause unspecified: Secondary | ICD-10-CM

## 2018-05-02 DIAGNOSIS — I252 Old myocardial infarction: Secondary | ICD-10-CM | POA: Diagnosis not present

## 2018-05-02 DIAGNOSIS — F0390 Unspecified dementia without behavioral disturbance: Secondary | ICD-10-CM | POA: Insufficient documentation

## 2018-05-02 DIAGNOSIS — N183 Chronic kidney disease, stage 3 (moderate): Secondary | ICD-10-CM | POA: Insufficient documentation

## 2018-05-02 DIAGNOSIS — I130 Hypertensive heart and chronic kidney disease with heart failure and stage 1 through stage 4 chronic kidney disease, or unspecified chronic kidney disease: Secondary | ICD-10-CM | POA: Insufficient documentation

## 2018-05-02 DIAGNOSIS — Z79899 Other long term (current) drug therapy: Secondary | ICD-10-CM | POA: Diagnosis not present

## 2018-05-02 DIAGNOSIS — Z951 Presence of aortocoronary bypass graft: Secondary | ICD-10-CM | POA: Diagnosis not present

## 2018-05-02 MED ORDER — NALOXONE HCL 2 MG/2ML IJ SOSY
PREFILLED_SYRINGE | INTRAMUSCULAR | Status: AC | PRN
  Administered 2018-05-02: 2 mg via INTRAVENOUS

## 2018-05-02 MED ORDER — SODIUM BICARBONATE 8.4 % IV SOLN
INTRAVENOUS | Status: AC | PRN
  Administered 2018-05-02: 100 meq via INTRAVENOUS

## 2018-05-02 MED ORDER — EPINEPHRINE PF 1 MG/10ML IJ SOSY
PREFILLED_SYRINGE | INTRAMUSCULAR | Status: AC | PRN
  Administered 2018-05-02 (×3): 1 mg via INTRAVENOUS

## 2018-05-02 MED ORDER — CALCIUM CHLORIDE 10 % IV SOLN
INTRAVENOUS | Status: AC | PRN
  Administered 2018-05-02: 1 g via INTRAVENOUS

## 2018-05-06 NOTE — ED Provider Notes (Signed)
[facility] EMERGENCY DEPARTMENT Provider Note   CSN: [id number] Arrival date & time: 05/09/2018  1329     History   Chief Complaint Chief Complaint  Patient presents with  . Cardiac Arrest    HPI Jerry Benson is a 84 y.o. male.  84 yo M with a chief complaint of cardiac arrest.  The patient was at his nursing home and rolled his wheelchair up to the desk stating that he was having chest pain.  Nonnormal was called and he was rolled back to his room and then he lost consciousness.  Per EMS they received CPR immediately upon losing consciousness.  EMS provided ATLS with approximately 7 epinephrines and multiple shocks.  Was in V. tach and then V. fib.  Given amiodarone.  Since then has been in PEA.  Had been in cardiac arrest for approximately 50 minutes by arrival.   Cardiac Arrest  Witnessed by:  Healthcare provider Incident location:  Nursing home Time since incident:  50 minutes Time before BLS initiated:  Immediate Time before ALS initiated:  3-5 minutes Condition upon EMS arrival:  Agonal respirations Pulse:  Absent Initial cardiac rhythm per EMS:  Ventricular tachycardia Treatments prior to arrival:  ACLS protocol and intubation Medications given prior to ED:  Amiodarone and epinephrine Airway:  Oropharyngeal Rhythm on admission to ED:  PEA Associated symptoms: chest pain     Past Medical History:  Diagnosis Date  . Chest pain   . CHF (congestive heart failure) (HCC)   . CKD (chronic kidney disease) stage 3, GFR 30-59 ml/min (HCC)   . Coronary artery disease    a. Remote MI 1981;  b. Prior CABG per Dr. Hendricks Milo in 1981;  c. redo CABG in 2009;  d. PCI to SVG to DX and OM in 2010;  e. PCI/DES VG-> OM (ISR) w/ 2.75x59mm Resolute DES Mar. 2013  . Dementia (HCC)   . Diabetes mellitus   . GERD (gastroesophageal reflux disease)   . Hyperlipidemia   . Hypertension   . Status post insertion of non-drug eluting coronary artery stent 10/05/2008     Patient Active Problem List   Diagnosis Date Noted  . NSTEMI (non-ST elevated myocardial infarction) (HCC) 11/19/2017  . Hyperglycemia 11/19/2017  . Pressure injury of skin 11/09/2017  . Hypoglycemia 11/08/2017  . Fall at home, initial encounter 11/08/2017  . Closed rib fracture 11/08/2017  . Word finding difficulty 04/14/2016  . Acute encephalopathy 04/14/2016  . Expressive aphasia 04/14/2016  . Hypertension 04/14/2016  . TIA (transient ischemic attack) 07/11/2015  . Memory disorder 05/28/2014  . Postherpetic neuralgia 09/25/2013  . Intertriginous candidiasis 01/12/2013  . Iron deficiency anemia 09/15/2012  . Malaise and fatigue 11/16/2011  . Unstable angina pectoris (HCC) 09/19/2011  . CKD (chronic kidney disease) stage 3, GFR 30-59 ml/min (HCC) 09/19/2011  . CAD (coronary artery disease) 01/21/2011  . Type 2 diabetes mellitus with vascular disease (HCC) 10/17/2010  . Hypercholesterolemia 10/17/2010  . Hx of CABG 10/17/2010  . Benign hypertensive heart disease without heart failure 10/17/2010  . Dyspepsia 10/17/2010    Past Surgical History:  Procedure Laterality Date  . CARDIAC CATHETERIZATION  10/11/2008   PCI to SVG to diagonal and OM in 2010  . CORONARY ARTERY BYPASS GRAFT     original surgery in in 1981 with redo surgery in 2009. His original surgery was by Dr. Hendricks Milo and he had a SVG to the RCA, SVG to DX/OM, and SVG to DX/LAD; Redo surgery  per Dr. Laneta Simmers in 2009 with LIMA to LAD and SVG to PD  . LEFT HEART CATHETERIZATION WITH CORONARY/GRAFT ANGIOGRAM N/A 09/18/2011   Procedure: LEFT HEART CATHETERIZATION WITH Isabel Caprice;  Surgeon: Herby Abraham, MD;  Location: [facility] CATH LAB;  Service: Cardiovascular;  Laterality: N/A;  . PERCUTANEOUS CORONARY STENT INTERVENTION (PCI-S) N/A 09/21/2011   Procedure: PERCUTANEOUS CORONARY STENT INTERVENTION (PCI-S);  Surgeon: Herby Abraham, MD;  Location: [facility] CATH LAB;  Service: Cardiovascular;  Laterality: N/A;         Home Medications    Prior to Admission medications   Medication Sig Start Date End Date Taking? Authorizing Provider  acetaminophen (TYLENOL) 325 MG tablet Take 650 mg by mouth 4 (four) times daily.     [provider]  carbidopa-levodopa (SINEMET IR) 10-100 MG tablet Take 1 tablet by mouth 3 (three) times daily.    [provider]  cholecalciferol (VITAMIN D) 1000 units tablet Take 1,000 Units by mouth daily.    [provider]  donepezil (ARICEPT) 5 MG tablet Take 1 tablet (5 mg total) by mouth at bedtime. 04/16/16   Alison Murray, MD  hydrocortisone cream 1 % Apply to affected area 2 times daily 04/30/18   Molpus, Jonny Ruiz, MD  insulin glargine (LANTUS) 100 UNIT/ML injection Inject 12 Units into the skin at bedtime.     [provider]  lidocaine (LIDODERM) 5 % Place 1 patch onto the skin daily. Remove & Discard patch within 12 hours or as directed by MD 11/10/17   Barnetta Chapel, MD  linagliptin (TRADJENTA) 5 MG TABS tablet Take 1 tablet (5 mg total) by mouth daily. 11/23/17   Osvaldo Shipper, MD  metoprolol succinate (TOPROL-XL) 25 MG 24 hr tablet Take 25 mg by mouth daily. 12/17/17   [provider]  nitroGLYCERIN (NITROSTAT) 0.4 MG SL tablet Place one (1) tablet under the tongue every 5 minutes as needed for chest pain max 3 doses in 15 before calling 911. 05/25/17   Corky Crafts, MD  traZODone (DESYREL) 50 MG tablet Take 50 mg by mouth at bedtime.    [provider]    Family History Family History  Problem Relation Age of Onset  . Heart disease Father   . Heart attack Father   . Heart disease Mother   . Stroke Daughter        09/2008  . Heart attack Brother     Social History Social History   Tobacco Use  . Smoking status: Never Smoker  . Smokeless tobacco: Never Used  Substance Use Topics  . Alcohol use: No  . Drug use: No     Allergies   Amlodipine; Lipitor [atorvastatin calcium]; and  Lisinopril   Review of Systems Review of Systems  Unable to perform ROS: Patient unresponsive  Cardiovascular: Positive for chest pain.     Physical Exam Updated Vital Signs There were no vitals taken for this visit.  Physical Exam  Constitutional:  pale  HENT:  Head: Normocephalic and atraumatic.  Eyes:  3mm minimally reactive  Neck: No JVD present.  Cardiovascular:  No noted pulse  Pulmonary/Chest:  Agonal respirations  Abdominal: He exhibits no distension. There is no tenderness. There is no guarding.  Musculoskeletal: He exhibits no tenderness or deformity.  Neurological: He is unresponsive. GCS eye subscore is 1. GCS verbal subscore is 1. GCS motor subscore is 1.  Skin: There is pallor.  Cool pale  Nursing note and vitals reviewed.  ED Treatments / Results  Labs (all labs ordered are listed, but only abnormal results are displayed) Labs Reviewed - No data to display  EKG None  Radiology No results found.  Procedures Procedures (including critical care time)   EMERGENCY DEPARTMENT [place] CARDIAC EXAM "Study: Limited Ultrasound of the Heart and Pericardium"  INDICATIONS:Cardiac arrest Multiple views of the heart and pericardium were obtained in real-time with a multi-frequency probe.  PERFORMED NW:GNFAOZ IMAGES ARCHIVED?: Yes LIMITATIONS:  Emergent procedure VIEWS USED: Subcostal 4 chamber INTERPRETATION: Cardiac activity absent, Cardiac tamponade absent and No cardiac activity  Cardiopulmonary Resuscitation (CPR) Procedure Note Directed/Performed by: Rae Roam I personally directed ancillary staff and/or performed CPR in an effort to regain return of spontaneous circulation and to maintain cardiac, neuro and systemic perfusion.    Medications Ordered in ED Medications  naloxone [facility]) injection (2 mg Intravenous Given 2018/05/11 1342)  EPINEPHrine (ADRENALIN) 1 MG/10ML injection (1 mg Intravenous Given 11-May-2018 1340)  calcium chloride  injection (1 g Intravenous Given May 11, 2018 1336)  sodium bicarbonate injection (100 mEq Intravenous Given 2018/05/11 1337)     Initial Impression / Assessment and Plan / ED Course  I have reviewed the triage vital signs and the nursing notes.  Pertinent labs & imaging results that were available during my care of the patient were reviewed by me and considered in my medical decision making (see chart for details).     84 yo M with a chief complaint of cardiac arrest.  This occurred about 50 minutes prior to arrival here.  Had received multiple medications and shocks without improvement.  Bedside ultrasound initially thought to have some cardiac activity though on repeat view on next pulse check it seemed more likely due to the agonal respirations than actual cardiac activity.  Due to the futility of CPR greater than an hour his advanced age and multiple medical comorbidities the code was terminated had 145.  CRITICAL CARE Performed by: Rae Roam   Total critical care time: 35 minutes  Critical care time was exclusive of separately billable procedures and treating other patients.  Critical care was necessary to treat or prevent imminent or life-threatening deterioration.  Critical care was time spent personally by me on the following activities: development of treatment plan with patient and/or surrogate as well as nursing, discussions with consultants, evaluation of patient's response to treatment, examination of patient, obtaining history from patient or surrogate, ordering and performing treatments and interventions, ordering and review of laboratory studies, ordering and review of radiographic studies, pulse oximetry and re-evaluation of patient's condition.   The patients results and plan were reviewed and discussed.   Any x-rays performed were independently reviewed by myself.   Differential diagnosis were considered with the presenting HPI.  Medications  naloxone  [facility]) injection (2 mg Intravenous Given 05/11/18 1342)  EPINEPHrine (ADRENALIN) 1 MG/10ML injection (1 mg Intravenous Given 05/11/2018 1340)  calcium chloride injection (1 g Intravenous Given 2018-05-11 1336)  sodium bicarbonate injection (100 mEq Intravenous Given 05/11/2018 1337)    There were no vitals filed for this visit.  Final diagnoses:  Cardiac arrest [facility])      Final Clinical Impressions(s) / ED Diagnoses   Final diagnoses:  Cardiac arrest [facility])    ED Discharge Orders    None       Melene Plan, DO 11-May-2018 1428

## 2018-05-06 NOTE — Code Documentation (Signed)
Time of Death 1345 pronounced by Dr Adela Lank

## 2018-05-06 NOTE — Progress Notes (Signed)
I received a page from the ED to provide spiritual/grief support for the patient's family. I visited with the family in Consult Room A and remained present while the physician gave an update on the patient's status. I provided spiritual support by listening with compassion to the patient's wife of 67 years and family members as they expressed their grief and loss. After receiving approval from the nurse, I accompanied the family to D36 to see their loved one. I provided pastoral care by remaining present with them until they were ready to leave the ED.    05-14-18 1400  Clinical Encounter Type  Visited With Family  Visit Type Spiritual support;Death;ED  Referral From Nurse  Consult/Referral To Chaplain  Spiritual Encounters  Spiritual Needs Prayer;Emotional;Grief support  Stress Factors  Family Stress Factors Exhausted;Loss    Chaplain Dr Melvyn Novas

## 2018-05-06 NOTE — ED Triage Notes (Addendum)
Pt arrived from [place] via [place] [facility] after pt walked to Nursing desk stated that he was having indigestion, pt was wheeled back to his room went unresponsive and went into cardiac arrest. Nursing staff started CPR, fire arrived at 1242 and continued cpr, pt was noted to be in Vtach at 170, pt received 5 shocks total for v-fib, never achieved rosc. Pt has 7.0 ett in place, being bagged, pulseless on arrival with agonal RR's. Pt received 4 epis and 450 total of amiodarone.

## 2018-05-06 DEATH — deceased

## 2018-08-02 ENCOUNTER — Ambulatory Visit: Payer: Medicare Other | Admitting: Interventional Cardiology

## 2020-02-02 IMAGING — DX DG SACRUM/COCCYX 2+V
3 series · 3 of 3 positions shown · non-contrast
Comparison: 11/09/2017 radiographs

CLINICAL DATA: Acute sacral/coccygeal pain.

EXAM:
SACRUM AND COCCYX - 2+ VIEW

[coccyx ap (1 of 2)]
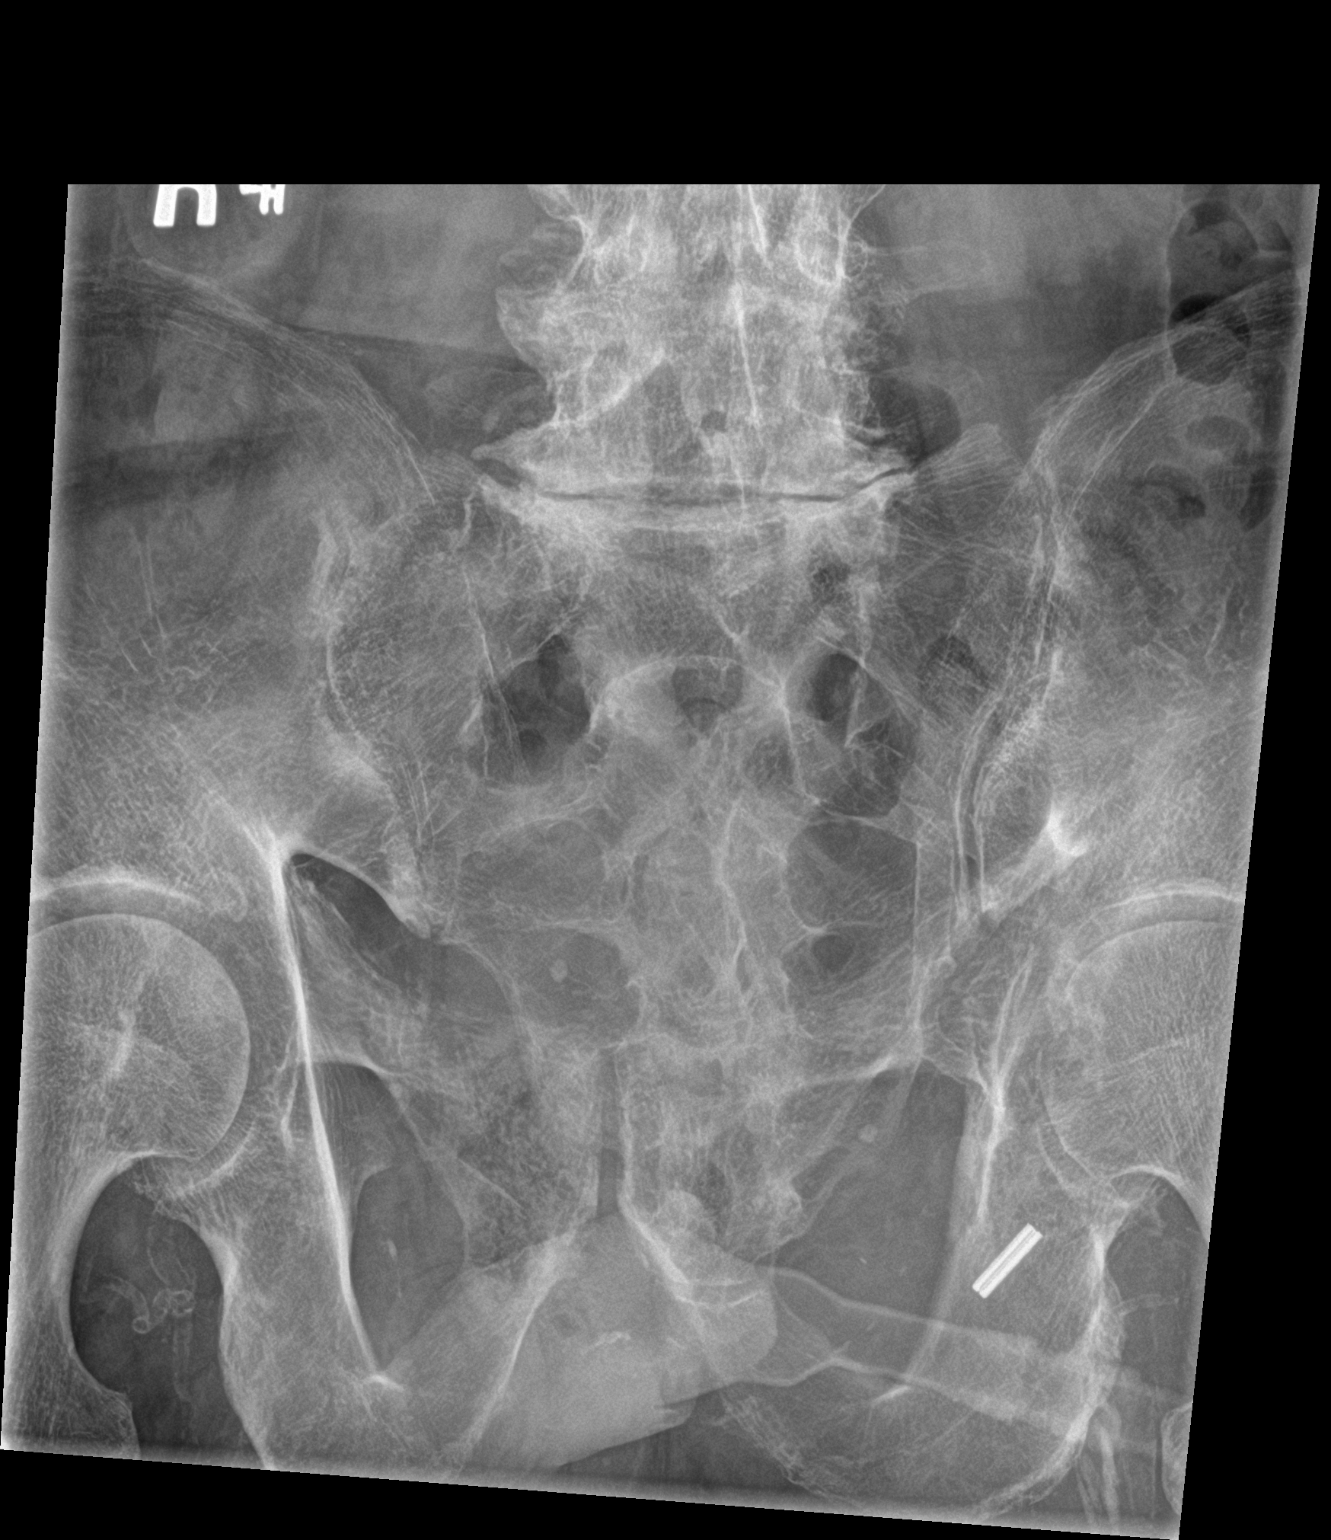

[sacrum lat]
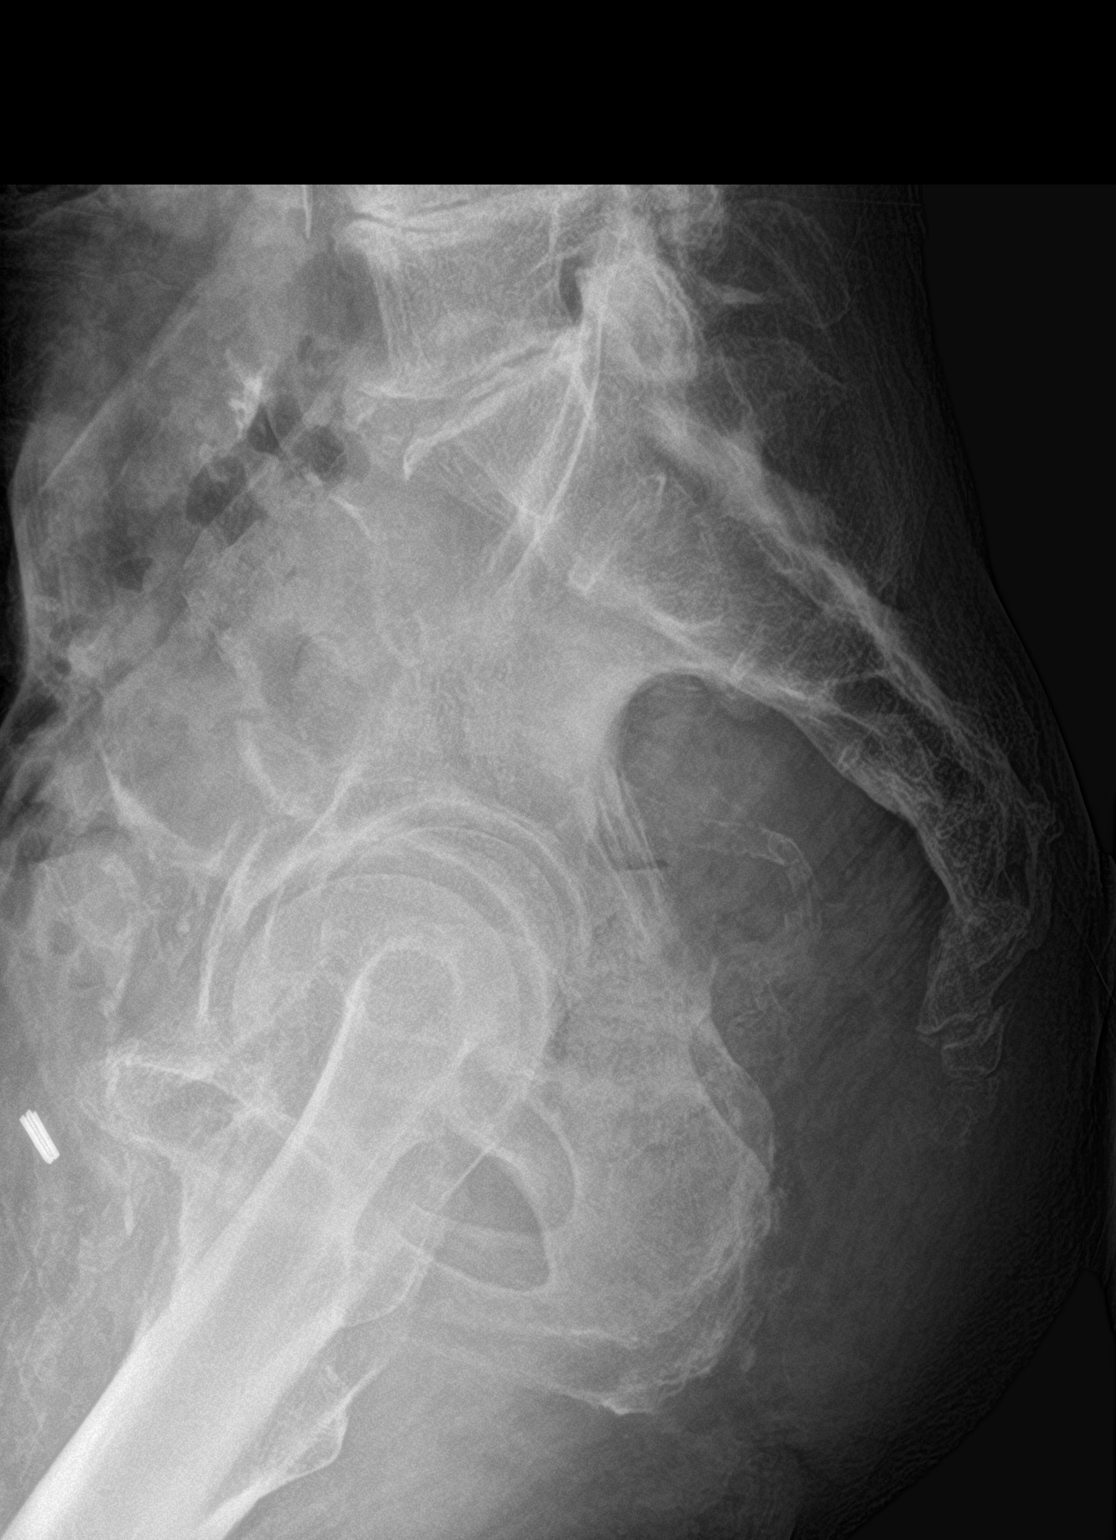

[coccyx ap (2 of 2)]
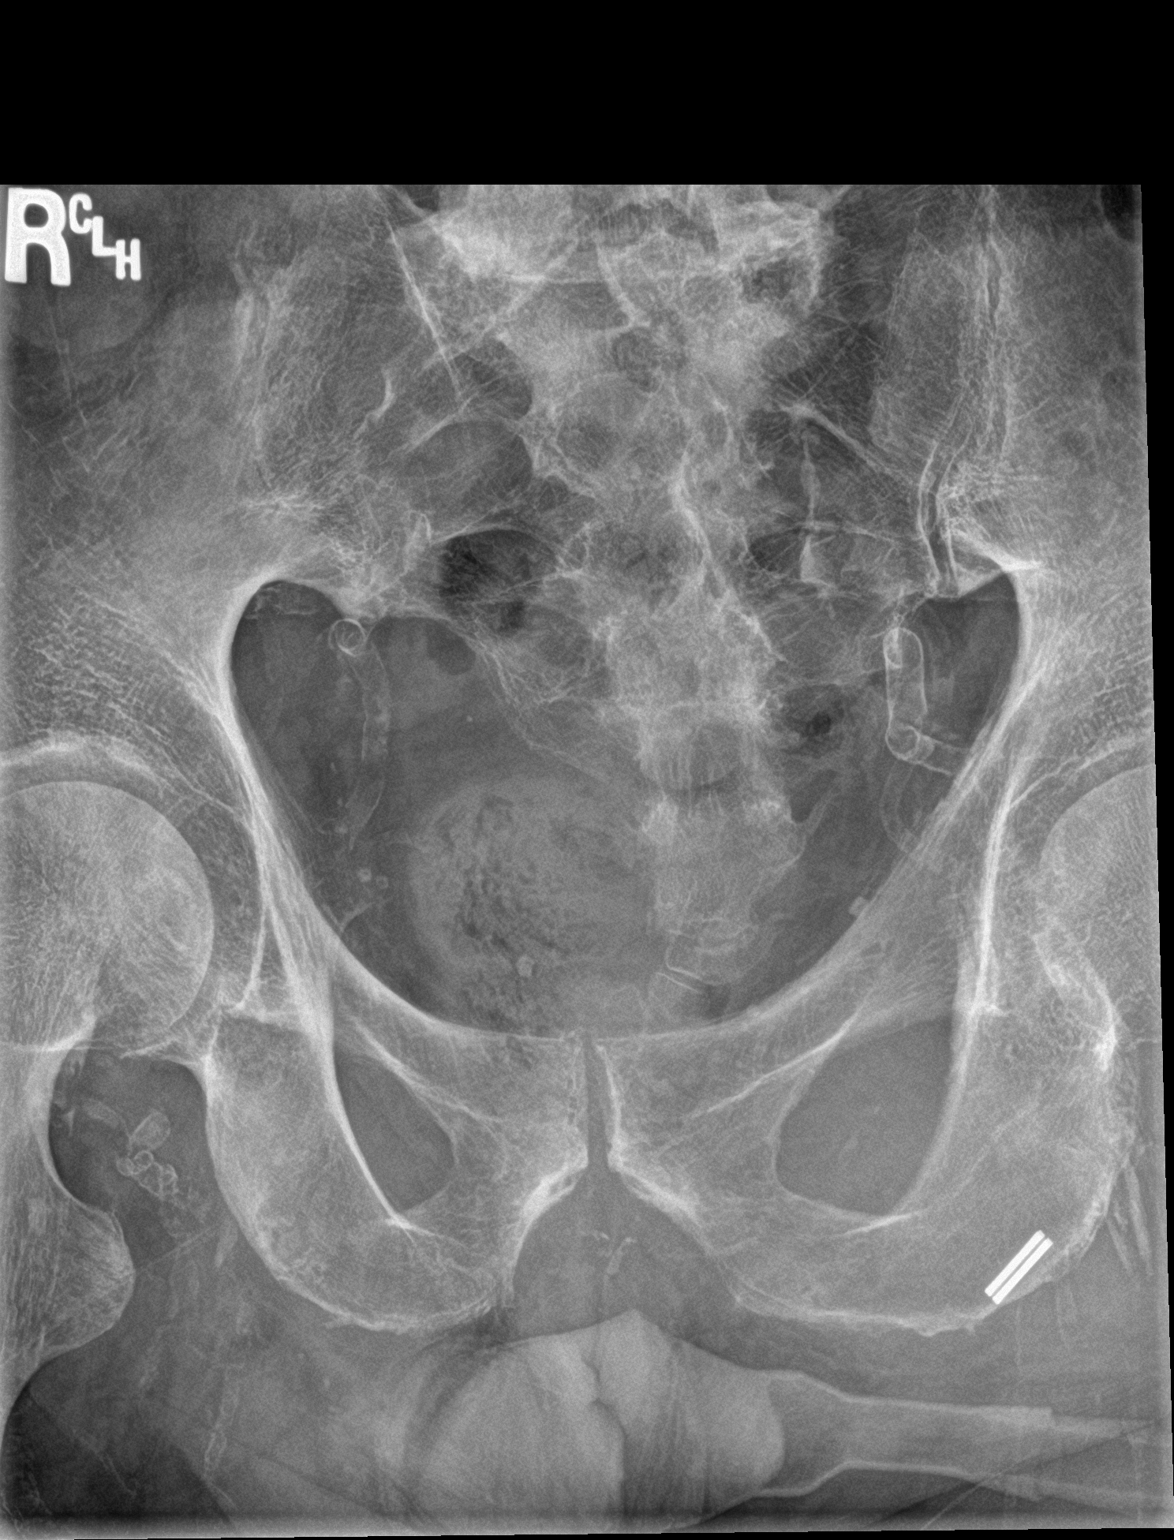

[3 of 3 positions shown; findings below may reference images not displayed]

FINDINGS: There is no evidence of fracture or other focal bone lesions.

Degenerative changes in the LOWER lumbar spine again noted.
IMPRESSION: No acute abnormality

Degenerative changes in the LOWER lumbar spine.

## 2020-07-04 IMAGING — CT CT CERVICAL SPINE W/O CM
5 of 8 series · 12 of 33 positions shown, 13 images · non-contrast
Comparison: CT brain 01/13/2018, CT neck 04/14/2016

CLINICAL DATA: Fall

EXAM:
CT HEAD WITHOUT CONTRAST
CT CERVICAL SPINE WITHOUT CONTRAST
TECHNIQUE: Multidetector CT imaging of the head and cervical spine was
performed following the standard protocol without intravenous
contrast. Multiplanar CT image reconstructions of the cervical spine
were also generated.

[Series 4: head bone · axial · 0.46mm/px · z∈[-33,+23]mm · 2 of 84 slices shown]
[im 28/84  bone]
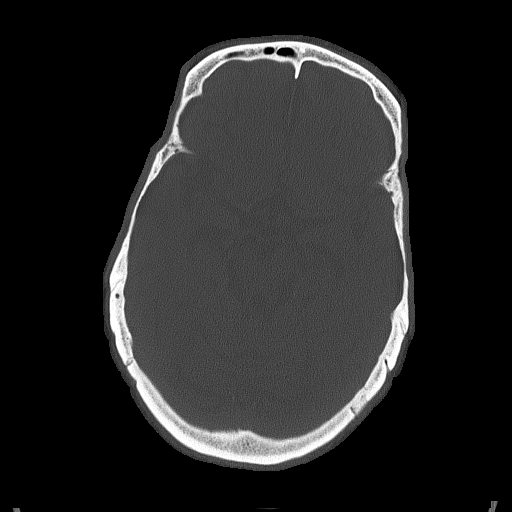
[im 56/84  bone]
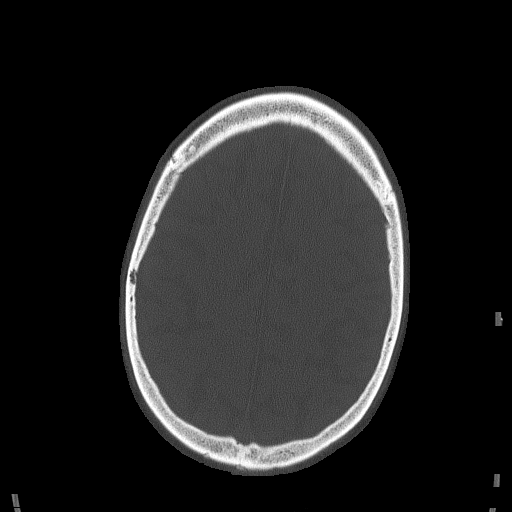

[Series 8: c_spine 2.0 st · axial · 0.34mm/px · z∈[-178,-126]mm · 2 of 80 slices shown, 3 images]
[im 27/80  soft-tissue]
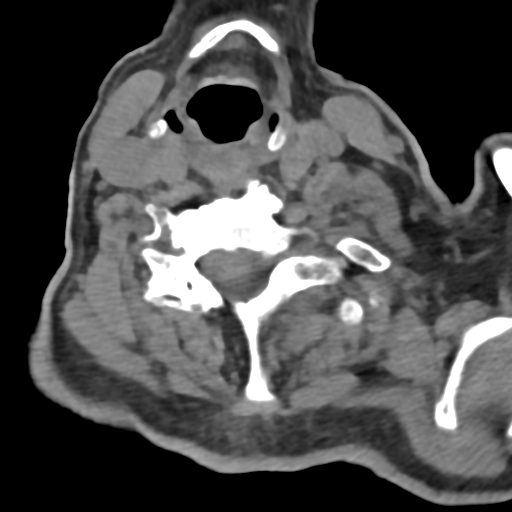
[im 27/80  bone]
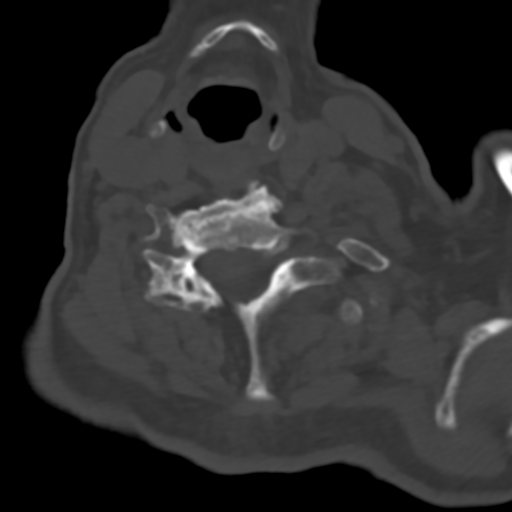
[im 53/80  bone]
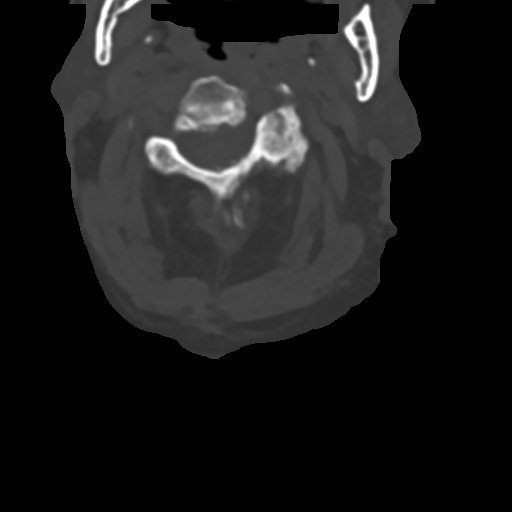

[Series 12: c_spine 2.0 sag bone · sagittal · 0.29mm/px · 5 of 61 slices shown]
[im 11/61  bone]
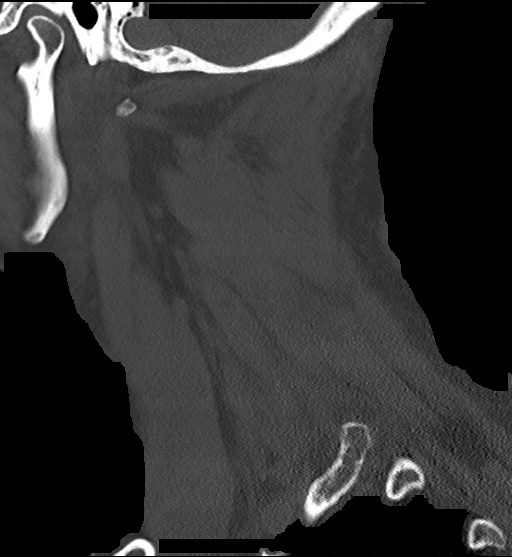
[im 21/61  bone]
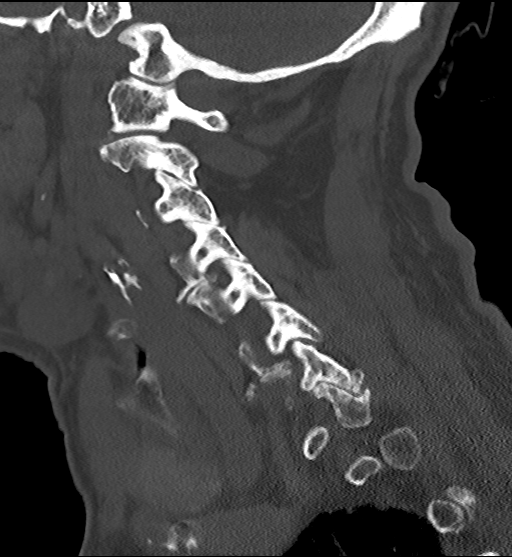
[im 31/61  bone]
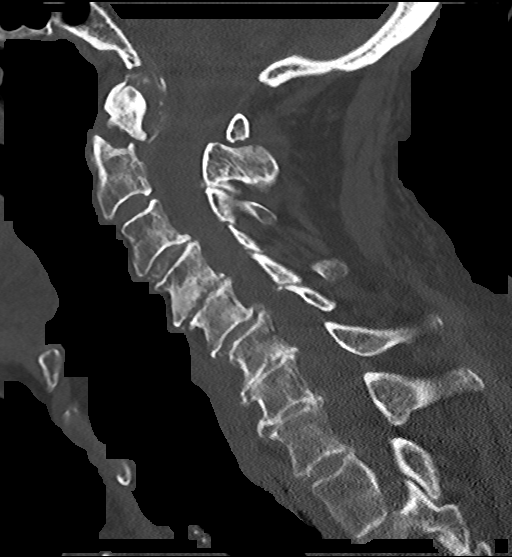
[im 41/61  bone]
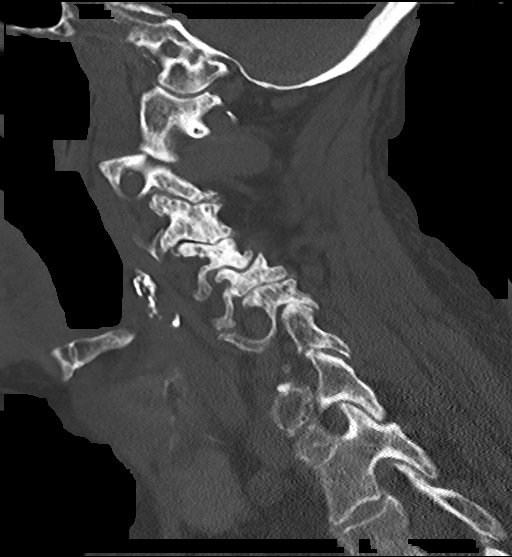
[im 51/61  bone]
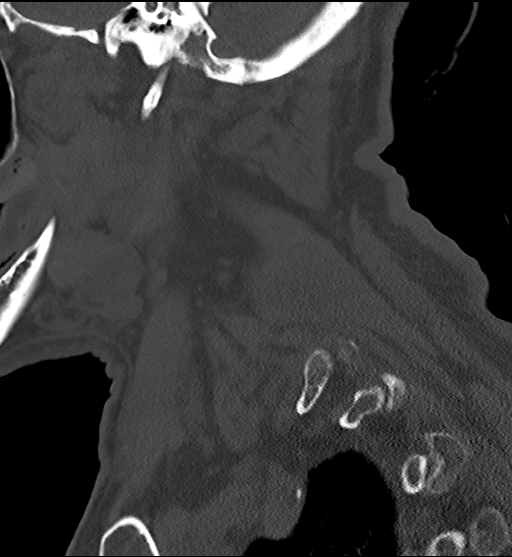

[Series 13: c_spine 2.0 cor bone · coronal · 0.23mm/px · 1 of 53 slices shown]
[im 27/53  bone]
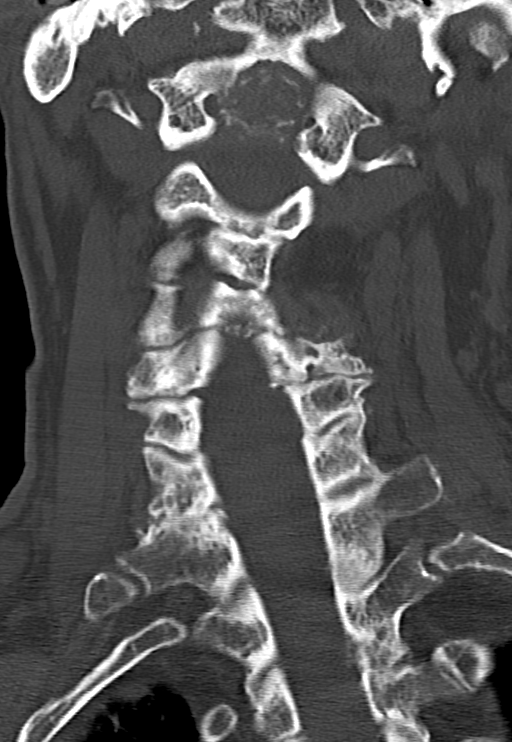

[Series 14: c_spine 2.0 orthogonals · oblique · 0.21mm/px · 2 of 85 slices shown]
[im 29/85  bone]
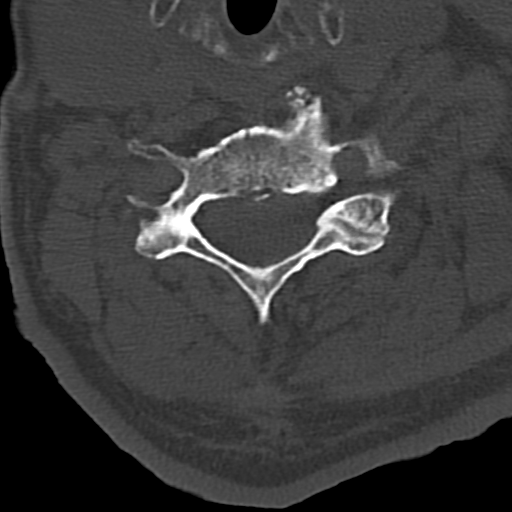
[im 57/85  bone]
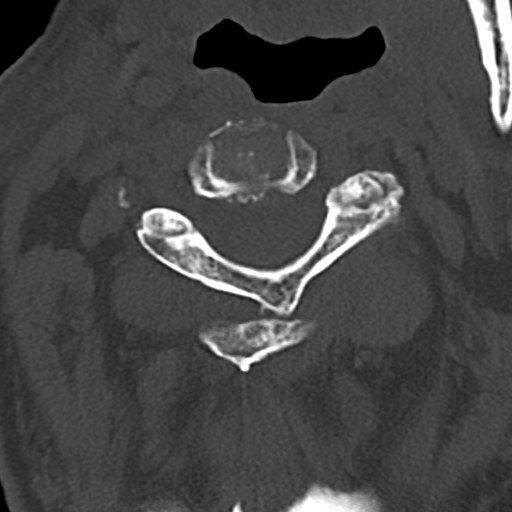

[12 of 33 positions shown; findings below may reference images not displayed]

FINDINGS: CT HEAD FINDINGS

Brain: No acute territorial infarction, hemorrhage or intracranial
mass. Atrophy and mild small vessel ischemic changes of the white
matter. Stable prominent ventricle size.

Vascular: No hyperdense vessels.  Carotid vascular calcification.

Skull: No fracture. Opacified left mastoid air cells with
postsurgical changes

Sinuses/Orbits: Mucosal thickening in the maxillary and ethmoid
sinuses

Other: None

CT CERVICAL SPINE FINDINGS

Alignment: Posterior offset of the spinal laminar line at C1. Grade
1 anterolisthesis of C3 on C4, C4 on C5, and C5 on C6. 11 mm
posterior offset of the left lateral mass of C1 with respect to C2.
Facet alignment within normal limits.

Skull base and vertebrae: Fracture through the base of the odontoid
with about 4 mm posterior displacement of the odontoid fracture
fragment. 6 mm separation of the odontoid PEG from the body of C2.
Partial fusion of the anterior arch of C1 to the odontoid. Attempted
callus about the fracture site with sclerotic margins suggesting
chronic ununited fracture. Calcified pannus around the odontoid.

Remaining vertebral bodies demonstrate normal stature.

Soft tissues and spinal canal: Prominent prevertebral soft tissues
at C2.

Disc levels: Marked degenerative changes C4-C5 and C6-C7 and C7-T1
with moderate degenerative change at C5-C6. Multiple level facet
degenerative change, left greater than right.

Upper chest: Lung apices are clear.  Thyroid within normal limits.

Other: None
IMPRESSION: 1. No CT evidence for acute intracranial abnormality. Atrophy and
small vessel ischemic changes of the white matter
2. Fracture through the base of the odontoid with 4 mm posterior
displacement of the odontoid fracture fragment and offset of the
posterior spinal laminar line. Attempted callus anteriorly at the
fracture site with sclerosis at the fracture margin suggesting
chronic ununited fracture. As compared with limited images of this
region on sagittal head CT from January 2018, the posterior
displacement of the odontoid is new.

3. Diffuse degenerative changes with grade 1 anterolisthesis C3 on
C4, C4 on C5 and C5 on C6, likely degenerative.

Critical Value/emergent results were called by telephone at the time
of interpretation on 04/21/2018 at [DATE] to Dr. NAZARETH JUMPER ,
who verbally acknowledged these results.
